# Patient Record
Sex: Female | Born: 1950
Health system: Southern US, Community
[De-identification: ages and names within clinical notes are randomized; demographics above are authoritative.]

## PROBLEM LIST (undated history)

## (undated) DIAGNOSIS — A809 Acute poliomyelitis, unspecified: Secondary | ICD-10-CM

## (undated) DIAGNOSIS — M1990 Unspecified osteoarthritis, unspecified site: Secondary | ICD-10-CM

## (undated) DIAGNOSIS — I10 Essential (primary) hypertension: Secondary | ICD-10-CM

## (undated) DIAGNOSIS — E785 Hyperlipidemia, unspecified: Secondary | ICD-10-CM

## (undated) HISTORY — DX: Acute poliomyelitis, unspecified: A80.9

## (undated) HISTORY — PX: TONSILLECTOMY AND ADENOIDECTOMY: SHX28

## (undated) HISTORY — DX: Hyperlipidemia, unspecified: E78.5

---
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
## 1995-12-06 ENCOUNTER — Encounter (INDEPENDENT_AMBULATORY_CARE_PROVIDER_SITE_OTHER): Payer: Self-pay | Admitting: *Deleted

## 1995-12-06 LAB — CONVERTED CEMR LAB

## 1998-05-16 ENCOUNTER — Encounter: Admission: RE | Admit: 1998-05-16 | Discharge: 1998-05-16 | Payer: Self-pay | Admitting: Family Medicine

## 2001-03-19 ENCOUNTER — Encounter: Admission: RE | Admit: 2001-03-19 | Discharge: 2001-03-19 | Payer: Self-pay | Admitting: Family Medicine

## 2001-04-01 ENCOUNTER — Encounter: Admission: RE | Admit: 2001-04-01 | Discharge: 2001-04-01 | Payer: Self-pay | Admitting: Family Medicine

## 2001-04-20 ENCOUNTER — Encounter: Admission: RE | Admit: 2001-04-20 | Discharge: 2001-04-20 | Payer: Self-pay | Admitting: Family Medicine

## 2003-06-20 ENCOUNTER — Encounter: Admission: RE | Admit: 2003-06-20 | Discharge: 2003-06-20 | Payer: Self-pay | Admitting: Family Medicine

## 2003-06-22 ENCOUNTER — Encounter: Admission: RE | Admit: 2003-06-22 | Discharge: 2003-06-22 | Payer: Self-pay | Admitting: Family Medicine

## 2005-02-19 ENCOUNTER — Ambulatory Visit: Payer: Self-pay | Admitting: Family Medicine

## 2005-03-06 ENCOUNTER — Ambulatory Visit: Payer: Self-pay | Admitting: Family Medicine

## 2006-01-24 ENCOUNTER — Emergency Department (HOSPITAL_COMMUNITY): Admission: EM | Admit: 2006-01-24 | Discharge: 2006-01-24 | Payer: Self-pay | Admitting: Family Medicine

## 2006-12-04 DIAGNOSIS — I10 Essential (primary) hypertension: Secondary | ICD-10-CM | POA: Insufficient documentation

## 2006-12-04 DIAGNOSIS — E669 Obesity, unspecified: Secondary | ICD-10-CM | POA: Insufficient documentation

## 2006-12-04 DIAGNOSIS — G609 Hereditary and idiopathic neuropathy, unspecified: Secondary | ICD-10-CM | POA: Insufficient documentation

## 2006-12-04 DIAGNOSIS — M1990 Unspecified osteoarthritis, unspecified site: Secondary | ICD-10-CM | POA: Insufficient documentation

## 2006-12-05 ENCOUNTER — Encounter (INDEPENDENT_AMBULATORY_CARE_PROVIDER_SITE_OTHER): Payer: Self-pay | Admitting: *Deleted

## 2007-04-29 ENCOUNTER — Ambulatory Visit (HOSPITAL_COMMUNITY): Admission: RE | Admit: 2007-04-29 | Discharge: 2007-04-29 | Payer: Self-pay | Admitting: Family Medicine

## 2007-04-29 ENCOUNTER — Ambulatory Visit: Payer: Self-pay | Admitting: Family Medicine

## 2007-04-29 ENCOUNTER — Encounter (INDEPENDENT_AMBULATORY_CARE_PROVIDER_SITE_OTHER): Payer: Self-pay | Admitting: *Deleted

## 2007-04-30 LAB — CONVERTED CEMR LAB
BUN: 12 mg/dL (ref 6–23)
CO2: 28 meq/L (ref 19–32)
Calcium: 9 mg/dL (ref 8.4–10.5)
Chloride: 103 meq/L (ref 96–112)
Cholesterol: 220 mg/dL — ABNORMAL HIGH (ref 0–200)
Creatinine, Ser: 0.79 mg/dL (ref 0.40–1.20)
Glucose, Bld: 105 mg/dL — ABNORMAL HIGH (ref 70–99)
HDL: 36 mg/dL — ABNORMAL LOW (ref >39)
LDL Cholesterol: 147 mg/dL — ABNORMAL HIGH (ref 0–99)
Potassium: 3.7 meq/L (ref 3.5–5.3)
Sodium: 140 meq/L (ref 135–145)
Total CHOL/HDL Ratio: 6.1
Triglycerides: 183 mg/dL — ABNORMAL HIGH (ref <150)
VLDL: 37 mg/dL (ref 0–40)

## 2007-05-01 ENCOUNTER — Encounter (INDEPENDENT_AMBULATORY_CARE_PROVIDER_SITE_OTHER): Payer: Self-pay | Admitting: *Deleted

## 2007-07-02 ENCOUNTER — Ambulatory Visit: Payer: Self-pay | Admitting: Family Medicine

## 2007-07-02 ENCOUNTER — Encounter (INDEPENDENT_AMBULATORY_CARE_PROVIDER_SITE_OTHER): Payer: Self-pay | Admitting: *Deleted

## 2007-07-03 ENCOUNTER — Encounter (INDEPENDENT_AMBULATORY_CARE_PROVIDER_SITE_OTHER): Payer: Self-pay | Admitting: *Deleted

## 2007-07-03 LAB — CONVERTED CEMR LAB
ALT: 15 units/L (ref 0–35)
AST: 17 units/L (ref 0–37)
Albumin: 4.3 g/dL (ref 3.5–5.2)
Alkaline Phosphatase: 61 units/L (ref 39–117)
BUN: 15 mg/dL (ref 6–23)
CO2: 29 meq/L (ref 19–32)
Calcium: 9.5 mg/dL (ref 8.4–10.5)
Chloride: 101 meq/L (ref 96–112)
Cholesterol: 222 mg/dL — ABNORMAL HIGH (ref 0–200)
Creatinine, Ser: 0.75 mg/dL (ref 0.40–1.20)
Glucose, Bld: 109 mg/dL — ABNORMAL HIGH (ref 70–99)
HDL: 38 mg/dL — ABNORMAL LOW (ref >39)
LDL Cholesterol: 156 mg/dL — ABNORMAL HIGH (ref 0–99)
Potassium: 3.7 meq/L (ref 3.5–5.3)
Sodium: 140 meq/L (ref 135–145)
Total Bilirubin: 0.6 mg/dL (ref 0.3–1.2)
Total CHOL/HDL Ratio: 5.8
Total Protein: 8 g/dL (ref 6.0–8.3)
Triglycerides: 142 mg/dL (ref <150)
VLDL: 28 mg/dL (ref 0–40)

## 2007-07-27 ENCOUNTER — Encounter (INDEPENDENT_AMBULATORY_CARE_PROVIDER_SITE_OTHER): Payer: Self-pay | Admitting: *Deleted

## 2007-07-27 ENCOUNTER — Ambulatory Visit: Payer: Self-pay | Admitting: Family Medicine

## 2007-07-27 DIAGNOSIS — E785 Hyperlipidemia, unspecified: Secondary | ICD-10-CM | POA: Insufficient documentation

## 2007-07-27 LAB — CONVERTED CEMR LAB
BUN: 14 mg/dL (ref 6–23)
CO2: 27 meq/L (ref 19–32)
Calcium: 9.8 mg/dL (ref 8.4–10.5)
Chloride: 103 meq/L (ref 96–112)
Creatinine, Ser: 0.87 mg/dL (ref 0.40–1.20)
Glucose, Bld: 103 mg/dL — ABNORMAL HIGH (ref 70–99)
Potassium: 4 meq/L (ref 3.5–5.3)
Sodium: 140 meq/L (ref 135–145)

## 2007-08-13 ENCOUNTER — Encounter: Admission: RE | Admit: 2007-08-13 | Discharge: 2007-08-13 | Payer: Self-pay | Admitting: Sports Medicine

## 2007-08-24 ENCOUNTER — Encounter (INDEPENDENT_AMBULATORY_CARE_PROVIDER_SITE_OTHER): Payer: Self-pay | Admitting: *Deleted

## 2007-08-24 ENCOUNTER — Ambulatory Visit: Payer: Self-pay | Admitting: Family Medicine

## 2007-08-24 LAB — CONVERTED CEMR LAB: Pap Smear: NORMAL

## 2007-08-31 ENCOUNTER — Encounter (INDEPENDENT_AMBULATORY_CARE_PROVIDER_SITE_OTHER): Payer: Self-pay | Admitting: *Deleted

## 2008-01-01 ENCOUNTER — Ambulatory Visit: Payer: Self-pay | Admitting: Family Medicine

## 2008-01-01 ENCOUNTER — Ambulatory Visit (HOSPITAL_COMMUNITY): Admission: RE | Admit: 2008-01-01 | Discharge: 2008-01-01 | Payer: Self-pay | Admitting: Family Medicine

## 2008-01-01 ENCOUNTER — Observation Stay (HOSPITAL_COMMUNITY): Admission: AD | Admit: 2008-01-01 | Discharge: 2008-01-02 | Payer: Self-pay | Admitting: Family Medicine

## 2008-01-02 ENCOUNTER — Encounter (INDEPENDENT_AMBULATORY_CARE_PROVIDER_SITE_OTHER): Payer: Self-pay | Admitting: *Deleted

## 2008-01-04 ENCOUNTER — Encounter (INDEPENDENT_AMBULATORY_CARE_PROVIDER_SITE_OTHER): Payer: Self-pay | Admitting: *Deleted

## 2008-01-12 ENCOUNTER — Ambulatory Visit (HOSPITAL_COMMUNITY): Admission: RE | Admit: 2008-01-12 | Discharge: 2008-01-12 | Payer: Self-pay | Admitting: Cardiovascular Disease

## 2008-01-19 ENCOUNTER — Encounter (INDEPENDENT_AMBULATORY_CARE_PROVIDER_SITE_OTHER): Payer: Self-pay | Admitting: *Deleted

## 2008-01-20 ENCOUNTER — Encounter (INDEPENDENT_AMBULATORY_CARE_PROVIDER_SITE_OTHER): Payer: Self-pay | Admitting: *Deleted

## 2008-03-03 ENCOUNTER — Encounter: Payer: Self-pay | Admitting: *Deleted

## 2008-06-15 ENCOUNTER — Telehealth: Payer: Self-pay | Admitting: *Deleted

## 2008-06-17 ENCOUNTER — Encounter: Payer: Self-pay | Admitting: Family Medicine

## 2008-06-21 ENCOUNTER — Encounter: Payer: Self-pay | Admitting: Family Medicine

## 2008-07-27 ENCOUNTER — Encounter: Payer: Self-pay | Admitting: Family Medicine

## 2008-08-03 ENCOUNTER — Telehealth: Payer: Self-pay | Admitting: *Deleted

## 2008-08-23 ENCOUNTER — Encounter: Payer: Self-pay | Admitting: Family Medicine

## 2008-08-23 ENCOUNTER — Ambulatory Visit: Payer: Self-pay | Admitting: Family Medicine

## 2008-08-24 LAB — CONVERTED CEMR LAB
BUN: 18 mg/dL (ref 6–23)
CO2: 27 meq/L (ref 19–32)
Calcium: 9.6 mg/dL (ref 8.4–10.5)
Chloride: 103 meq/L (ref 96–112)
Creatinine, Ser: 0.87 mg/dL (ref 0.40–1.20)
Direct LDL: 156 mg/dL — ABNORMAL HIGH
Glucose, Bld: 104 mg/dL — ABNORMAL HIGH (ref 70–99)
Potassium: 3.7 meq/L (ref 3.5–5.3)
Sodium: 140 meq/L (ref 135–145)

## 2009-04-28 ENCOUNTER — Telehealth: Payer: Self-pay | Admitting: *Deleted

## 2009-05-23 ENCOUNTER — Encounter: Payer: Self-pay | Admitting: Family Medicine

## 2009-05-23 ENCOUNTER — Ambulatory Visit: Payer: Self-pay | Admitting: Family Medicine

## 2009-05-23 DIAGNOSIS — M21969 Unspecified acquired deformity of unspecified lower leg: Secondary | ICD-10-CM | POA: Insufficient documentation

## 2009-05-23 HISTORY — DX: Unspecified acquired deformity of unspecified lower leg: M21.969

## 2009-05-24 ENCOUNTER — Encounter: Payer: Self-pay | Admitting: Family Medicine

## 2009-05-24 LAB — CONVERTED CEMR LAB
ALT: 23 units/L (ref 0–35)
AST: 25 units/L (ref 0–37)
Albumin: 4.2 g/dL (ref 3.5–5.2)
Alkaline Phosphatase: 70 units/L (ref 39–117)
BUN: 10 mg/dL (ref 6–23)
CO2: 23 meq/L (ref 19–32)
Calcium: 9.3 mg/dL (ref 8.4–10.5)
Chloride: 108 meq/L (ref 96–112)
Cholesterol: 125 mg/dL (ref 0–200)
Creatinine, Ser: 0.77 mg/dL (ref 0.40–1.20)
Glucose, Bld: 114 mg/dL — ABNORMAL HIGH (ref 70–99)
HCT: 40.3 % (ref 36.0–46.0)
HDL: 39 mg/dL — ABNORMAL LOW (ref >39)
Hemoglobin: 12.6 g/dL (ref 12.0–15.0)
LDL Cholesterol: 65 mg/dL (ref 0–99)
MCHC: 31.3 g/dL (ref 30.0–36.0)
MCV: 96.2 fL (ref 78.0–100.0)
Platelets: 207 10*3/uL (ref 150–400)
Potassium: 3.9 meq/L (ref 3.5–5.3)
RBC: 4.19 M/uL (ref 3.87–5.11)
RDW: 12.5 % (ref 11.5–15.5)
Sodium: 140 meq/L (ref 135–145)
Total Bilirubin: 0.6 mg/dL (ref 0.3–1.2)
Total CHOL/HDL Ratio: 3.2
Total Protein: 7.6 g/dL (ref 6.0–8.3)
Triglycerides: 105 mg/dL (ref <150)
VLDL: 21 mg/dL (ref 0–40)
WBC: 4.9 10*3/uL (ref 4.0–10.5)

## 2009-07-11 ENCOUNTER — Ambulatory Visit: Payer: Self-pay | Admitting: Family Medicine

## 2009-08-16 ENCOUNTER — Telehealth (INDEPENDENT_AMBULATORY_CARE_PROVIDER_SITE_OTHER): Payer: Self-pay | Admitting: Family Medicine

## 2009-12-08 ENCOUNTER — Ambulatory Visit: Payer: Self-pay | Admitting: Family Medicine

## 2009-12-11 ENCOUNTER — Ambulatory Visit: Payer: Self-pay | Admitting: Family Medicine

## 2010-09-05 ENCOUNTER — Encounter: Payer: Self-pay | Admitting: Family Medicine

## 2010-09-05 ENCOUNTER — Ambulatory Visit: Payer: Self-pay | Admitting: Family Medicine

## 2010-09-05 LAB — CONVERTED CEMR LAB
ALT: 18 units/L (ref 0–35)
AST: 19 units/L (ref 0–37)
Albumin: 4.4 g/dL (ref 3.5–5.2)
Alkaline Phosphatase: 60 units/L (ref 39–117)
BUN: 20 mg/dL (ref 6–23)
CO2: 28 meq/L (ref 19–32)
Calcium: 9.7 mg/dL (ref 8.4–10.5)
Chloride: 103 meq/L (ref 96–112)
Creatinine, Ser: 0.88 mg/dL (ref 0.40–1.20)
Direct LDL: 134 mg/dL — ABNORMAL HIGH
Glucose, Bld: 107 mg/dL — ABNORMAL HIGH (ref 70–99)
HCT: 40.6 % (ref 36.0–46.0)
Hemoglobin: 13.1 g/dL (ref 12.0–15.0)
MCHC: 32.3 g/dL (ref 30.0–36.0)
MCV: 94.9 fL (ref 78.0–100.0)
Platelets: 210 10*3/uL (ref 150–400)
Potassium: 4.6 meq/L (ref 3.5–5.3)
RBC: 4.28 M/uL (ref 3.87–5.11)
RDW: 12.4 % (ref 11.5–15.5)
Sodium: 140 meq/L (ref 135–145)
Total Bilirubin: 0.5 mg/dL (ref 0.3–1.2)
Total Protein: 7.7 g/dL (ref 6.0–8.3)
WBC: 4.9 10*3/uL (ref 4.0–10.5)

## 2010-09-06 ENCOUNTER — Encounter: Payer: Self-pay | Admitting: Family Medicine

## 2010-10-02 ENCOUNTER — Encounter: Payer: Self-pay | Admitting: Family Medicine

## 2010-10-02 ENCOUNTER — Ambulatory Visit: Payer: Self-pay

## 2010-11-06 NOTE — Assessment & Plan Note (Signed)
Summary: cpe/tb test,df   Vital Signs:  Patient profile:   60 year old female Height:      63.5 inches Weight:      177 pounds BMI:     30.97 BSA:     1.85 Temp:     98.7 degrees F Pulse rate:   54 / minute BP sitting:   195 / 113  Vitals Entered By: Jone Baseman CMA (December 08, 2009 8:42 AM)  Serial Vital Signs/Assessments:  Comments: 8:47 AM Manual BP: 180/110 By: Jone Baseman CMA   CC: CPE/ TB test Is Patient Diabetic? No Pain Assessment Patient in pain? no        Primary Care Provider:  Ancil Boozer  MD  CC:  CPE/ TB test.  History of Present Illness:  Hypertension follow-up      This is a 60 year old woman who presents for Hypertension follow-up.  The patient denies lightheadedness, headaches, edema, and rash.  The patient denies the following associated symptoms: chest pain, exercise intolerance, dyspnea, and leg edema.  Compliance with medications (by patient report) has been sporadic.  The patient reports that dietary compliance has been poor.  Did not take medications today.  Only has 2 bottles with her including metoprolol and lisinopril/HCTZ.  Does not seem to know she is on spirinolactone.  Ate salty snacks last pm in celebration dinner.  Has 3 new foster kids living at her house.  She needs PE and TB test for foster care.  Habits & Providers  Alcohol-Tobacco-Diet     Tobacco Status: never  Current Medications (verified): 1)  Lisinopril-Hydrochlorothiazide 20-12.5 Mg  Tabs (Lisinopril-Hydrochlorothiazide) .... 2 Pills By Mouth Once Daily 2)  Metoprolol Tartrate 25 Mg Tabs (Metoprolol Tartrate) .Marland Kitchen.. 1 Tab By Mouth Two Times A Day 3)  Aspirin 81 Mg  Tbec (Aspirin) .... One By Mouth Every Day 4)  Caltrate 600+d 600-400 Mg-Unit  Tabs (Calcium Carbonate-Vitamin D) .Marland Kitchen.. 1 By Mouth Bid 5)  Nitroglycerin 0.4 Mg Subl (Nitroglycerin) .Marland Kitchen.. 1 Under Tongue Every 5 Min As Needed For Chest Pain.  If Helps Chest Pain Call Physician Right Away. 6)  Lovastatin  20 Mg Tabs (Lovastatin) .Marland Kitchen.. 1 By Mouth At Bedtime For Cholesterol.  Replaces Your Lipitor. 7)  Spironolactone 25 Mg Tabs (Spironolactone) .Marland Kitchen.. 1 By Mouth Once Daily For Blood Pressure. 8)  Diclofenac Sodium 75 Mg Tbec (Diclofenac Sodium) .Marland Kitchen.. 1 By Mouth Two Times A Day For Pain.  Allergies (verified): 1)  ! Penicillin  Past History:  Past Medical History: Last updated: 01/01/2008   HYPERLIPIDEMIA (ICD-272.4) OSTEOARTHRITIS, MULTI SITES (ICD-715.98) OBESITY, NOS (ICD-278.00) NEUROPATHY, PERIPHERAL (ICD-356.9) HYPERTENSION, BENIGN SYSTEMIC (ICD-401.1) H/o childhood polio infection L foot neuropathy due to polio R knee injection (6/02)  Past Surgical History: Last updated: 12/04/2006 T & A -  Family History: Last updated: 01/01/2008 F--CA (unknown cause), M--htn, DM, died of resp failure in her 14's brother and sister w/ htn no fam hx of colon ca, cad  Social History: Last updated: 12/08/2009 Single, teaches preschool Quit smoking 15 yrs ago no alcohol or other drug use 3 foster kids at home-age 25, 7, 9 (2011)  Risk Factors: Alcohol Use: 0 (07/11/2009)  Risk Factors: Smoking Status: never (12/08/2009)  Social History: Single, teaches preschool Quit smoking 15 yrs ago no alcohol or other drug use 3 foster kids at home-age 25, 7, 9 (2011)  Review of Systems  The patient denies anorexia, weight gain, chest pain, syncope, dyspnea on exertion, peripheral edema, headaches, abdominal  pain, hematochezia, severe indigestion/heartburn, hematuria, muscle weakness, and depression.    Physical Exam  General:  alert, well-developed, and well-nourished.   Head:  normocephalic and atraumatic.   Neck:  supple.   Lungs:  normal respiratory effort.   Heart:  normal rate, regular rhythm, no murmur, no gallop, and no rub.   Abdomen:  soft, non-tender, and normal bowel sounds.     Impression & Recommendations:  Problem # 1:  OSTEOARTHRITIS, MULTI SITES (ICD-715.98)  Her  updated medication list for this problem includes:    Aspirin 81 Mg Tbec (Aspirin) ..... One by mouth every day    Diclofenac Sodium 75 Mg Tbec (Diclofenac sodium) .Marland Kitchen... 1 by mouth two times a day for pain.  Orders: [facility]- Est Level  3 (12878)  Problem # 2:  HYPERTENSION, BENIGN SYSTEMIC (ICD-401.1) Assessment: Deteriorated  Risk of stroke reviewed with pt.  Needs medical and dietary compliance.  Her updated medication list for this problem includes:    Lisinopril-hydrochlorothiazide 20-12.5 Mg Tabs (Lisinopril-hydrochlorothiazide) .Marland Kitchen... 2 pills by mouth once daily    Metoprolol Tartrate 25 Mg Tabs (Metoprolol tartrate) .Marland Kitchen... 1 tab by mouth two times a day    Spironolactone 25 Mg Tabs (Spironolactone) .Marland Kitchen... 1 by mouth once daily for blood pressure.  Orders: [facility]- Est Level  3 (67672)  Problem # 3:  FOOT DEFORMITY, ACQUIRED (ICD-736.70)  Complete Medication List: 1)  Lisinopril-hydrochlorothiazide 20-12.5 Mg Tabs (Lisinopril-hydrochlorothiazide) .... 2 pills by mouth once daily 2)  Metoprolol Tartrate 25 Mg Tabs (Metoprolol tartrate) .Marland Kitchen.. 1 tab by mouth two times a day 3)  Aspirin 81 Mg Tbec (Aspirin) .... One by mouth every day 4)  Caltrate 600+d 600-400 Mg-unit Tabs (Calcium carbonate-vitamin d) .Marland Kitchen.. 1 by mouth bid 5)  Nitroglycerin 0.4 Mg Subl (Nitroglycerin) .Marland Kitchen.. 1 under tongue every 5 min as needed for chest pain.  if helps chest pain call physician right away. 6)  Lovastatin 20 Mg Tabs (Lovastatin) .Marland Kitchen.. 1 by mouth at bedtime for cholesterol.  replaces your lipitor. 7)  Spironolactone 25 Mg Tabs (Spironolactone) .Marland Kitchen.. 1 by mouth once daily for blood pressure. 8)  Diclofenac Sodium 75 Mg Tbec (Diclofenac sodium) .Marland Kitchen.. 1 by mouth two times a day for pain.  Other Orders: TB Skin Test [id number]) Admin 1st Vaccine (96283) Admin 1st Vaccine [facility]) [phone])  Patient Instructions: 1)  Please schedule a follow-up appointment in 1 month to re-check blood pressure.    Prevention & Chronic  Care Immunizations   Influenza vaccine: Fluvax 3+  (07/27/2007)   Influenza vaccine due: 07/26/2008    Tetanus booster: 02/04/1990: Done.   Tetanus booster due: 02/05/2000    Pneumococcal vaccine: Not documented  Colorectal Screening   Hemoccult: negative  (08/24/2007)   Hemoccult due: 08/23/2008    Colonoscopy: Not documented   Colonoscopy due: Not Indicated  Other Screening   Pap smear: normal  (08/24/2007)   Pap smear due: 08/23/2010    Mammogram: Assessment: BIRADS 1.   (08/14/2007)   Mammogram action/deferral: Screening mammogram in 1 year.     (08/14/2007)   Mammogram due: 08/13/2009   Smoking status: never  (12/08/2009)  Lipids   Total Cholesterol: 125  (05/23/2009)   Lipid panel action/deferral: Lipid Panel ordered   LDL: 65  (05/23/2009)   LDL Direct: 156  (08/23/2008)   HDL: 39  (05/23/2009)   Triglycerides: 105  (05/23/2009)   Lipid panel due: 05/23/2010    SGOT (AST): 25  (05/23/2009)   SGPT (ALT): 23  (05/23/2009)   Alkaline  phosphatase: 70  (05/23/2009)   Total bilirubin: 0.6  (05/23/2009)   Liver panel due: 05/23/2010    Lipid flowsheet reviewed?: Yes   Progress toward LDL goal: At goal  Hypertension   Last Blood Pressure: 195 / 113  (12/08/2009)   Serum creatinine: 0.77  (05/23/2009)   Serum potassium 3.9  (05/23/2009)   Basic metabolic panel due: 05/23/2010    Hypertension flowsheet reviewed?: Yes   Progress toward BP goal: Unchanged  Self-Management Support :    Hypertension self-management support: Not documented    Lipid self-management support: Not documented   Prescriptions: NITROGLYCERIN 0.4 MG SUBL (NITROGLYCERIN) 1 under tongue every 5 min as needed for chest pain.  If helps chest pain call physician right away.  #25 x 3   Entered and Authorized by:   Tinnie Gens MD   Signed by:   Tinnie Gens MD on 12/08/2009   Method used:   Electronically to        [facility] [phone]* (retail)       [address]        [place], [place]  [address]       Ph: [phone]       Fax: [phone]   RxID:   [id number] LOVASTATIN 20 MG TABS (LOVASTATIN) 1 by mouth at bedtime for cholesterol.  replaces your lipitor.  #30 x 3   Entered and Authorized by:   Tinnie Gens MD   Signed by:   Tinnie Gens MD on 12/08/2009   Method used:   Electronically to        [facility] [phone]* (retail)       [address]       [place], [place]  [address]       Ph: [phone]       Fax: [phone]   RxID:   [id number] DICLOFENAC SODIUM 75 MG TBEC (DICLOFENAC SODIUM) 1 by mouth two times a day for pain.  #60 x 3   Entered and Authorized by:   Tinnie Gens MD   Signed by:   Tinnie Gens MD on 12/08/2009   Method used:   Electronically to        [facility] [phone]* (retail)       [address]       [place], [place]  [address]       Ph: [phone]       Fax: [phone]   RxID:   [id number] SPIRONOLACTONE 25 MG TABS (SPIRONOLACTONE) 1 by mouth once daily for blood pressure.  #30 x 3   Entered and Authorized by:   Tinnie Gens MD   Signed by:   Tinnie Gens MD on 12/08/2009   Method used:   Electronically to        [facility] [phone]* (retail)       [address]       [place], [place]  [address]       Ph: [phone]       Fax: [phone]   RxID:   [id number] METOPROLOL TARTRATE 25 MG TABS (METOPROLOL TARTRATE) 1 tab by mouth two times a day  #60 x 3   Entered and Authorized by:   Tinnie Gens MD   Signed by:   Tinnie Gens MD on 12/08/2009   Method used:   Electronically to        [facility] [phone]* (retail)       [address]  [place], [place]  [address]       Ph: [phone]       Fax: [phone]   RxID:   [id number] LISINOPRIL-HYDROCHLOROTHIAZIDE 20-12.5 MG  TABS (LISINOPRIL-HYDROCHLOROTHIAZIDE) 2 pills by mouth once daily  #60 x 3   Entered and Authorized by:   Tinnie Gens MD   Signed by:   Tinnie Gens MD on 12/08/2009   Method used:   Electronically to         [facility] [phone]* (retail)       [address]       [place], [place]  [address]       Ph: [phone]       Fax: [phone]   RxID:   [id number]    PPD Application    Vaccine Type: PPD    Site: left forearm    Mfr: Sanofi Pasteur    Dose: 0.1 ml    Route: ID    Given by: Garen Grams LPN    Exp. Date: 03/03/2012    Lot #: [id number]

## 2010-11-06 NOTE — Consult Note (Signed)
Summary: [facility]  [facility]   Imported By: Haydee Salter 01/20/2008 14:04:54  _____________________________________________________________________  External Attachment:    Type:   Image     Comment:   External Document

## 2010-11-06 NOTE — Assessment & Plan Note (Signed)
Summary: read tb/eo   Nurse Visit   Allergies: 1)  ! Penicillin  PPD Results    Date of reading: 12/11/2009    Results: 5 mm    Interpretation: negative  Orders Added: 1)  No Charge Patient Arrived (NCPA0) [NCPA0]

## 2010-11-06 NOTE — Letter (Signed)
Summary: Generic Letter  [facility]  [address]   [place], [place] [address]   Phone: [phone]  Fax: [phone]    09/06/2010  Sarah [name] [address] [place], [place]  [address]  Dear [name],   I write to let you know that I have received the results of your recent lab tests.  The kidney function, liver function and blood count are all normal.  The sugar (glucose) was done after eating, and is normal as well.  I strongly recommend that you continue taking your blood pressure medications and follow up with your primary doctor in our office in the coming month.      Sincerely,   Paula Compton MD  Appended Document: Generic Letter mailed

## 2010-11-06 NOTE — Progress Notes (Signed)
Summary: Rx   Phone Note Call from Patient Call back at Home Phone [phone]   Summary of Call: has scheduled an appt for 11/17 with Dr. Sandi Mealy, would like to discuss refills. Initial call taken by: Haydee Salter,  August 03, 2008 11:18 AM  Follow-up for Phone Call        Pt is out of the following meds.  Advised would send enough to last till appt. Pt agreeable. Follow-up by: Jone Baseman CMA,  August 03, 2008 11:56 AM      Prescriptions: LIPITOR 40 MG TABS (ATORVASTATIN CALCIUM) Take 1 tab by mouth at bedtime  #21 x 0   Entered by:   Jone Baseman CMA   Authorized by:   Ancil Boozer  MD   Signed by:   Jone Baseman CMA on 08/03/2008   Method used:   Electronically to        [facility]* (retail)       [address]       [place], [place]  [address]       Ph: [phone]       Fax: [phone]   RxID:   [id number] NORVASC 10 MG TABS (AMLODIPINE BESYLATE) 1 by mouth once daily  #21 x 0   Entered by:   Jone Baseman CMA   Authorized by:   Ancil Boozer  MD   Signed by:   Jone Baseman CMA on 08/03/2008   Method used:   Electronically to        [facility]* (retail)       [address]       [place], [place]  [address]       Ph: [phone]       Fax: [phone]   RxID:   [id number] METOPROLOL TARTRATE 25 MG TABS (METOPROLOL TARTRATE) 1 tab by mouth two times a day  #42 x 0   Entered by:   Jone Baseman CMA   Authorized by:   Ancil Boozer  MD   Signed by:   Jone Baseman CMA on 08/03/2008   Method used:   Electronically to        [facility]* (retail)       [address]       [place], [place]  [address]       Ph: [phone]       Fax: [phone]   RxID:   [id number] LISINOPRIL-HYDROCHLOROTHIAZIDE 20-12.5 MG  TABS (LISINOPRIL-HYDROCHLOROTHIAZIDE) 2 pills by mouth once daily  #42 x 0   Entered by:   Jone Baseman CMA   Authorized by:   Ancil Boozer  MD   Signed by:   Jone Baseman CMA on 08/03/2008   Method  used:   Electronically to        [facility]* (retail)       [address]       [place], [place]  [address]       Ph: [phone]       Fax: [phone]   RxID:   [id number]

## 2010-11-06 NOTE — Assessment & Plan Note (Signed)
Summary: DNKA               

## 2010-11-06 NOTE — Miscellaneous (Signed)
Summary: DNKA  Clinical Lists Changes 

## 2010-11-06 NOTE — Assessment & Plan Note (Signed)
Summary: f/u meds,df   Vital Signs:  Patient profile:   60 year old female Height:      63.5 inches Weight:      186.3 pounds BMI:     32.60 Temp:     98 degrees F Pulse rate:   87 / minute BP sitting:   196 / 104  (right arm)  Vitals Entered By: Theresia Lo RN (May 23, 2009 8:30 AM) CC: f/u HTN.  having some dizziness and chest tightness Is Patient Diabetic? No Pain Assessment Patient in pain? no        Primary Care Provider:  Ancil Boozer  MD  CC:  f/u HTN.  having some dizziness and chest tightness.  History of Present Illness: HTN: has been out of meds for a week at least.  was taking she thinks just lisinopril-hctz and metoprolol prior to that, not norvasc as prescribed.  has noticed since then that she has occasional "swimmy headedness" that lasts 20-23min and occurs approximately every other day. associated with it is some leg swelling and mild pain.  she thinks when it occurs she has perhaps been overworking.  denies associated weakness.  also at times she notices chest tightness.  usually she is sitting when this occurs and it resolves on its own after 10-15 min.  never has happened with exercise.  doesn't radiate anywhere.  no associated nausea/diaphoresis or SOB.   requesting podiatry referral for left foot deformity.  Habits & Providers  Alcohol-Tobacco-Diet     Tobacco Status: quit  Current Medications (verified): 1)  Lisinopril-Hydrochlorothiazide 20-12.5 Mg  Tabs (Lisinopril-Hydrochlorothiazide) .... 2 Pills By Mouth Once Daily 2)  Metoprolol Tartrate 25 Mg Tabs (Metoprolol Tartrate) .Marland Kitchen.. 1 Tab By Mouth Two Times A Day 3)  Lipitor 40 Mg Tabs (Atorvastatin Calcium) .... Take 1 Tab By Mouth At Bedtime 4)  Aspirin 81 Mg  Tbec (Aspirin) .... One By Mouth Every Day 5)  Caltrate 600+d 600-400 Mg-Unit  Tabs (Calcium Carbonate-Vitamin D) .Marland Kitchen.. 1 By Mouth Bid 6)  Nitroglycerin 0.4 Mg Subl (Nitroglycerin) .Marland Kitchen.. 1 Under Tongue Every 5 Min As Needed For Chest Pain.   If Helps Chest Pain Call Physician Right Away.  Allergies (verified): 1)  ! Penicillin  Past History:  Past medical, surgical, family and social histories (including risk factors) reviewed for relevance to current acute and chronic problems.  Past Medical History: Reviewed history from 01/01/2008 and no changes required.   HYPERLIPIDEMIA (ICD-272.4) OSTEOARTHRITIS, MULTI SITES (ICD-715.98) OBESITY, NOS (ICD-278.00) NEUROPATHY, PERIPHERAL (ICD-356.9) HYPERTENSION, BENIGN SYSTEMIC (ICD-401.1) H/o childhood polio infection L foot neuropathy due to polio R knee injection (6/02)  Past Surgical History: Reviewed history from 12/04/2006 and no changes required. T & A -  Family History: Reviewed history from 01/01/2008 and no changes required. F--CA (unknown cause), M--htn, DM, died of resp failure in her 84's brother and sister w/ htn no fam hx of colon ca, cad  Social History: Reviewed history from 01/01/2008 and no changes required. Single, teaches preschool Quit smoking 15 yrs ago no alcohol or other drug use  Review of Systems       per HPI   Physical Exam  General:  Well-developed,well-nourished,in no acute distress; alert,appropriate and cooperative throughout examination.  obese Head:  Normocephalic and atraumatic without obvious abnormalities. No apparent alopecia or balding. Lungs:  Normal respiratory effort, chest expands symmetrically. Lungs are clear to auscultation, no crackles or wheezes. Heart:  Normal rate and regular rhythm. S1 and S2 normal without gallop, murmur, click, rub  or other extra sounds. Msk:  bony deformity with abnormal gait.  Extremities:  no edema Neurologic:  alert & oriented X3.     Impression & Recommendations:  Problem # 1:  HYPERTENSION, BENIGN SYSTEMIC (ICD-401.1) Assessment Deteriorated  not at goal but not on medications.  restart meds. check basic labs given associated findings.  f/u in 1 month to see if norvasc also needs  restarted.   The following medications were removed from the medication list:    Norvasc 10 Mg Tabs (Amlodipine besylate) .Marland Kitchen... 1 by mouth once daily Her updated medication list for this problem includes:    Lisinopril-hydrochlorothiazide 20-12.5 Mg Tabs (Lisinopril-hydrochlorothiazide) .Marland Kitchen... 2 pills by mouth once daily    Metoprolol Tartrate 25 Mg Tabs (Metoprolol tartrate) .Marland Kitchen... 1 tab by mouth two times a day  Orders: [facility]- Est  Level 4 (33295)  Problem # 2:  DIZZINESS (ICD-780.4) Assessment: New check labs. wonder if this is realted to multiple things - not being on medications, heat, etc. f/u once on medications  Orders: [facility] (18841) [facility]- Est  Level 4 (66063)  Problem # 3:  FOOT DEFORMITY, ACQUIRED (ICD-736.70) Assessment: Deteriorated podiatry referral placed.   Orders: [facility]- Est  Level 4 (01601) Podiatry Referral (Podiatry)  Complete Medication List: 1)  Lisinopril-hydrochlorothiazide 20-12.5 Mg Tabs (Lisinopril-hydrochlorothiazide) .... 2 pills by mouth once daily 2)  Metoprolol Tartrate 25 Mg Tabs (Metoprolol tartrate) .Marland Kitchen.. 1 tab by mouth two times a day 3)  Lipitor 40 Mg Tabs (Atorvastatin calcium) .... Take 1 tab by mouth at bedtime 4)  Aspirin 81 Mg Tbec (Aspirin) .... One by mouth every day 5)  Caltrate 600+d 600-400 Mg-unit Tabs (Calcium carbonate-vitamin d) .Marland Kitchen.. 1 by mouth bid 6)  Nitroglycerin 0.4 Mg Subl (Nitroglycerin) .Marland Kitchen.. 1 under tongue every 5 min as needed for chest pain.  if helps chest pain call physician right away.  Other Orders: T-Lipid Profile [id number]) Comp Met-[facility] [id number])  Patient Instructions: 1)  Please follow up in about 4 weeks so we can see how your blood pressure is doing back on your medications and so we can review your blood work. 2)  Be sure to pick up your medicines at [facility] on [address]. There is a medicine there that if you have chest pain I want you to take.  If it helps your chest pain, call the doctor right away. 3)   It was nice to see you today. Prescriptions: METOPROLOL TARTRATE 25 MG TABS (METOPROLOL TARTRATE) 1 tab by mouth two times a day  #60 x 3   Entered and Authorized by:   Ancil Boozer  MD   Signed by:   Ancil Boozer  MD on 05/23/2009   Method used:   Electronically to        [facility] [phone]* (retail)       [address]       [place], [place]  [address]       Ph: [phone]       Fax: [phone]   RxID:   [id number] LIPITOR 40 MG TABS (ATORVASTATIN CALCIUM) Take 1 tab by mouth at bedtime  #30 x 3   Entered and Authorized by:   Ancil Boozer  MD   Signed by:   Ancil Boozer  MD on 05/23/2009   Method used:   Electronically to        [facility] [phone]* (retail)       [address]       [place], [place]  [address]       Ph: [phone]       Fax: [phone]   RxID:   [id number] LISINOPRIL-HYDROCHLOROTHIAZIDE 20-12.5 MG  TABS (LISINOPRIL-HYDROCHLOROTHIAZIDE) 2 pills by mouth once daily  #60 x 3   Entered and Authorized by:   Ancil Boozer  MD   Signed by:   Ancil Boozer  MD on 05/23/2009   Method used:   Electronically to        [facility] [phone]* (retail)       [address]       [place], [place]  [address]       Ph: [phone]       Fax: [phone]   RxID:   [id number] NITROGLYCERIN 0.4 MG SUBL (NITROGLYCERIN) 1 under tongue every 5 min as needed for chest pain.  If helps chest pain call physician right away.  #25 x 11   Entered and Authorized by:   Ancil Boozer  MD   Signed by:   Ancil Boozer  MD on 05/23/2009   Method used:   Electronically to        [facility] [phone]* (retail)       [address]       [place], [place]  [address]       Ph: [phone]       Fax: [phone]   RxID:   [id number]   Prevention & Chronic Care Immunizations   Influenza vaccine: Fluvax 3+  (07/27/2007)   Influenza vaccine due: 07/26/2008    Tetanus booster: 02/04/1990: Done.   Tetanus booster due: 02/05/2000     Pneumococcal vaccine: Not documented  Colorectal Screening   Hemoccult: negative  (08/24/2007)   Hemoccult due: 08/23/2008    Colonoscopy: Not documented   Colonoscopy due: Not Indicated  Other Screening   Pap smear: normal  (08/24/2007)   Pap smear due: 08/23/2010    Mammogram: Assessment: BIRADS 1.   (08/14/2007)   Mammogram action/deferral: Screening mammogram in 1 year.     (08/14/2007)   Mammogram due: 08/13/2009   Smoking status: quit  (05/23/2009)  Lipids   Total Cholesterol: 222  (07/02/2007)   Lipid panel action/deferral: Lipid Panel ordered   LDL: 156  (07/02/2007)   LDL Direct: 156  (08/23/2008)   HDL: 38  (07/02/2007)   Triglycerides: 142  (07/02/2007)    SGOT (AST): 17  (07/02/2007)   SGPT (ALT): 15  (07/02/2007) CMP ordered    Alkaline phosphatase: 61  (07/02/2007)   Total bilirubin: 0.6  (07/02/2007)  Hypertension   Last Blood Pressure: 196 / 104  (05/23/2009)   Serum creatinine: 0.87  (08/23/2008)   Serum potassium 3.7  (08/23/2008) CMP ordered   Self-Management Support :    Hypertension self-management support: Not documented    Lipid self-management support: Not documented

## 2010-11-06 NOTE — Assessment & Plan Note (Signed)
Summary: 1 MO F/U/DLH  Medications Added NORVASC 10 MG TABS (AMLODIPINE BESYLATE) 1 by mouth once daily      Allergies Added: NKDA  Vital Signs:  Patient Profile:   60 Years Old Female Height:     63.5 inches Weight:      185.0 pounds Temp:     98.7 degrees F Pulse rate:   63 / minute BP sitting:   152 / 86  Pt. in pain?   yes    Location:   abdomen    Intensity:   8  Vitals Entered By: Altamese Dilling CMA, (August 24, 2007 10:47 AM)                  Chief Complaint:  1 MO F/U.  History of Present Illness: Hypertension      Using medication:  _x_ without problems  Lightheadedness:  _x_ none Chest pain with exertion: _x_ none  Headaches:  ___x none Vision changes:  __x_ none  Elevated Cholesterol :  Continues with diet and exercise. Increasing amount of fruits and vegetables. Exercising w/ walking and situps 2-3 x per week.  abdominal pain- 1 day history of upper abdominal pain. Is "burning" pain.  Was present yesterday, then went away overnight, and is back again this morning. NO relation to food. Eating normally.  No nausea, vomiting, diarrhea, melena, hematochezia.   Health maintanence- due for pap today. she is divorced and not currently sexually active. She is s/p tubal ligation. She has not had a period in 2 years. no vaginal bleeding or spotting. Occasional hot flash but these are not symptomatic.  She does regular self breast exams and has not noted any masses, bleeding, or discharge. Mammogram normal last month.  No vaginal discharge.    Current Allergies: No known allergies    Social History:    Reviewed history from 07/02/2007 and no changes required:       Single, teaches preschool       Quit smoking 15 yrs ago       no alchohol or other drug use    Review of Systems  GI      Denies change in bowel habits, constipation, and yellowish skin color.  GU      Denies dysuria and genital sores.   Physical Exam  General:  Well-developed,well-nourished,in no acute distress; alert,appropriate and cooperative throughout examination Mouth:     pharynx pink and moist.   Neck:     no carotid bruits. No thyromegaly or thyroid nodules Lungs:     Normal respiratory effort, chest expands symmetrically. Lungs are clear to auscultation, no crackles or wheezes. Heart:     Normal rate and regular rhythm. S1 and S2 normal without gallop, murmur, click, rub or other extra sounds. Abdomen:     Bowel sounds positive,abdomen soft and non-tender without masses, organomegaly or hernias noted. Rectal:     No external abnormalities noted. Normal sphincter tone. No rectal masses or tenderness. stool heme negative. Genitalia:     Pelvic Exam:        External: normal female genitalia without lesions or masses        Vagina: normal without lesions or masses        Cervix: normal without lesions or masses        Adnexa: normal bimanual exam without masses or fullness        Uterus: normal by palpation        Pap smear: performed    Impression &  Recommendations:  Problem # 1:  HYPERTENSION, BENIGN SYSTEMIC (ICD-401.1) Assessment: Unchanged Blood pressure improved, but still above goal. Will increase norvasc to 10 mg daily. Con't diet, exercise. She is avoiding salty foods. Her updated medication list for this problem includes:    Lisinopril-hydrochlorothiazide 20-12.5 Mg Tabs (Lisinopril-hydrochlorothiazide) .Marland Kitchen... 2 pills by mouth once daily    Metoprolol Tartrate 25 Mg Tabs (Metoprolol tartrate) .Marland Kitchen... 1 tab by mouth two times a day    Norvasc 10 Mg Tabs (Amlodipine besylate) .Marland Kitchen... 1 by mouth once daily  Orders: [facility]- Est  Level 4 (87564)   Problem # 2:  HYPERLIPIDEMIA (ICD-272.4) Assessment: Unchanged Continue to work on diet and exercise. Recheck LDL in 1 month. Orders: [facility]- Est  Level 4 (33295)   Problem # 3:  Preventive Health Care (ICD-V70.0) Assessment: Unchanged Mammogram normal. Stool  heme negative. Working  on arranging colonoscopy for screening. She is planning to meet with debra hill to get project acess.  Will notify her of pap smear results. She has had flu shot.  Consider tetanus vaccine at next visit.  Problem # 4:  ABDOMINAL PAIN (ICD-789.00) Assessment: New No evidence of acute abdomen. Normal physical exam.  Will continute to follow. She will notify of any worsening or persistant symptoms. Orders: [facility]- Est  Level 4 (18841)   Complete Medication List: 1)  Lisinopril-hydrochlorothiazide 20-12.5 Mg Tabs (Lisinopril-hydrochlorothiazide) .... 2 pills by mouth once daily 2)  Metoprolol Tartrate 25 Mg Tabs (Metoprolol tartrate) .Marland Kitchen.. 1 tab by mouth two times a day 3)  Norvasc 10 Mg Tabs (Amlodipine besylate) .Marland Kitchen.. 1 by mouth once daily  Other Orders: Pap Smear-[facility] (66063-01601)   Patient Instructions: 1)  Please schedule a follow-up appointment in 1 month. 2)  Increase norvasc to 10 mg daily 3)  continue diet and exercise to work on high cholesterol    Prescriptions: NORVASC 10 MG TABS (AMLODIPINE BESYLATE) 1 by mouth once daily  #30 x 5   Entered and Authorized by:   Benn Moulder MD   Signed by:   Benn Moulder MD on 08/24/2007   Method used:   Electronically sent to ...       [address]       [address]       [place], [place]  [address]       Ph: [phone]       Fax: [phone]   RxID:   [id number]  ]  Preventive Care Screening  Breast Self Exam Ed:    Date:  08/24/2007    Results:  yes  Hemoccult:    Date:  08/24/2007    Next Due:  09/2008    Results:  negative

## 2010-11-06 NOTE — Letter (Signed)
Summary: Lipid Letter  [facility]  [address]   [place], [place] [address]   Phone: [phone]  Fax: [phone]    05/24/2009  Sarah [name] [address] [place], [place]  [address]  Dear Lucendia Herrlich:  We have carefully reviewed your last lipid profile from 05/23/2009 and the results are noted below with a summary of recommendations for lipid management.    Cholesterol:       125     Goal: < 200   HDL "good" Cholesterol:   39     Goal: > 40   LDL "bad" Cholesterol:   65     Goal: < 130   Triglycerides:       105     Goal: < 150    Overall these numbers are excellent.  Below are some ways to help keep them that way.  We also checked your kidney and liver function as well as your blood counts.  All of this was normal as well.  If you have questions please let us know at the family practice center.    TLC Diet (Therapeutic Lifestyle Change): Saturated Fats & Transfatty acids should be kept < 7% of total calories ***Reduce Saturated Fats Polyunstaurated Fat can be up to 10% of total calories Monounsaturated Fat Fat can be up to 20% of total calories Total Fat should be no greater than 25-35% of total calories Carbohydrates should be 50-60% of total calories Protein should be approximately 15% of total calories Fiber should be at least 20-30 grams a day ***Increased fiber may help lower LDL Total Cholesterol should be < 200mg /day Consider adding plant stanol/sterols to diet (example: Benacol spread) ***A higher intake of unsaturated fat may reduce Triglycerides and Increase HDL    Adjunctive Measures (may lower LIPIDS and reduce risk of Heart Attack) include: Aerobic Exercise (20-30 minutes 3-4 times a week) Limit Alcohol Consumption Weight Reduction Aspirin 75-81 mg a day by mouth (if not allergic or contraindicated) Dietary Fiber 20-30 grams a day by mouth     Current Medications: 1)    Lisinopril-hydrochlorothiazide 20-12.5 Mg  Tabs  (Lisinopril-hydrochlorothiazide) .... 2 pills by mouth once daily 2)    Metoprolol Tartrate 25 Mg Tabs (Metoprolol tartrate) .Marland Kitchen.. 1 tab by mouth two times a day 3)    Lipitor 40 Mg Tabs (Atorvastatin calcium) .... Take 1 tab by mouth at bedtime 4)    Aspirin 81 Mg  Tbec (Aspirin) .... One by mouth every day 5)    Caltrate 600+d 600-400 Mg-unit  Tabs (Calcium carbonate-vitamin d) .Marland Kitchen.. 1 by mouth bid 6)    Nitroglycerin 0.4 Mg Subl (Nitroglycerin) .Marland Kitchen.. 1 under tongue every 5 min as needed for chest pain.  if helps chest pain call physician right away.  If you have any questions, please call. We appreciate being able to work with you.   Sincerely,    [facility] Ancil Boozer  MD  Appended Document: Lipid Letter Mailed.

## 2010-11-06 NOTE — Assessment & Plan Note (Signed)
Summary: FU/KH  Medications Added LISINOPRIL-HYDROCHLOROTHIAZIDE 20-12.5 MG  TABS (LISINOPRIL-HYDROCHLOROTHIAZIDE) 2 pills by mouth once daily NORVASC 5 MG TABS (AMLODIPINE BESYLATE) 1 by mouth once daily      Allergies Added: NKDA  Vital Signs:  Patient Profile:   60 Years Old Female Height:     63.5 inches Weight:      190.3 pounds BMI:     33.30 Temp:     98.1 degrees F Pulse rate:   53 / minute BP sitting:   189 / 96  Pt. in pain?   yes    Location:   l breast    Intensity:   4  Vitals Entered By: Golden Circle RN (July 27, 2007 9:16 AM)                  Chief Complaint:  f/u meds.  History of Present Illness: Hypertension      Using medication:  _x_ without problems  Lightheadedness:  _x_ none Chest pain with exertion: _x_ none  Headaches:  __x_ none Vision changes:  __x_ none Average home BPs: has not checked. She has been watching her sodium intake.  She does not eat many vegetables.  hyperlipidemia- cholesterol panel drawn and results reviewed.  LDL of 156 above goal. HDL low at 38 as well.  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  She admits she does have room for improvement in her diet.   Colon CA screening- she has thought about options and would like colonoscopy. No melena, hematochezia, or fam hx of colon cancer  Mammogram- she has not scheduled yet.  Current Allergies: No known allergies    Family History:    Reviewed history from 07/02/2007 and no changes required:       F--CA (unknown cause), M--htn, DM, died of resp failure       brother and sister w/ htn       no fam hx of colon ca, cad  Social History:    Reviewed history from 07/02/2007 and no changes required:       Single, teaches preschool       Quit smoking 15 yrs ago       no alchohol or other drug use     Physical Exam  General:     Well-developed,well-nourished,in no acute distress; alert,appropriate and cooperative  throughout examination Mouth:     pharynx pink and moist.   Neck:     no carotid bruits. No thyromegaly or thyroid nodules Lungs:     Normal respiratory effort, chest expands symmetrically. Lungs are clear to auscultation, no crackles or wheezes. Heart:     Normal rate and regular rhythm. S1 and S2 normal without gallop, murmur, click, rub or other extra sounds.    Impression & Recommendations:  Problem # 1:  HYPERTENSION, BENIGN SYSTEMIC (ICD-401.1) Assessment: Unchanged Blood pressure remains well above goal. Will increase lisinopril-hctz dose. Unable to increase metoprolol due to heart rate.  Will add norvasc.  F/u in 2-4 weeks. If still elevated will need to increase norvasc dose.  Discussed dietary changes and low sodium diet.  Check renal function today. Her updated medication list for this problem includes:      Lisinopril-hydrochlorothiazide 20-12.5 Mg Tabs (Lisinopril-hydrochlorothiazide) .Marland Kitchen... 2 pills by mouth once daily    Metoprolol Tartrate 25 Mg Tabs (Metoprolol tartrate) .Marland Kitchen... 1 tab by mouth two times a day    Norvasc 5 Mg Tabs (Amlodipine besylate) .Marland KitchenMarland KitchenMarland KitchenMarland Kitchen  1 by mouth once daily  Orders: [facility]- Est  Level 4 (67893) Basic Met-[facility] (81017-51025) Gastroenterology Referral (GI)  Her updated medication list for this problem includes:    Lisinopril-hydrochlorothiazide 20-12.5 Mg Tabs (Lisinopril-hydrochlorothiazide) .Marland Kitchen... 2 pills by mouth once daily    Metoprolol Tartrate 25 Mg Tabs (Metoprolol tartrate) .Marland Kitchen... 1 tab by mouth two times a day    Norvasc 5 Mg Tabs (Amlodipine besylate) .Marland Kitchen... 1 by mouth once daily   Problem # 2:  HYPERLIPIDEMIA (ICD-272.4) Assessment: New LDL of 156 above goal of 130.  Will try a 3 month trial of diet and exercise and then recheck LDL. Reviewed dietary guidance and handout given. She declines nutritionist appointment at this time. Orders: [facility]- Est  Level 4 (85277)   Problem # 3:  Preventive Health Care (ICD-V70.0) Assessment: Comment  Only Flu shot given today. She would like colonscopy for colon ca screening.  Will f/u for pap at next appt.  Was given handout to schedule mammogram.  Complete Medication List: 1)  Lisinopril-hydrochlorothiazide 20-12.5 Mg Tabs (Lisinopril-hydrochlorothiazide) .... 2 pills by mouth once daily 2)  Metoprolol Tartrate 25 Mg Tabs (Metoprolol tartrate) .Marland Kitchen.. 1 tab by mouth two times a day 3)  Norvasc 5 Mg Tabs (Amlodipine besylate) .Marland Kitchen.. 1 by mouth once daily  Other Orders: Flu Vaccine 54yrs + (82423) Admin 1st Vaccine (53614)   Patient Instructions: 1)  for high blood pressure- Increase dose of lisinopril-hctz (you can take two pills at once, and then get new prescription).  continue metoprolol at same dose.  New medicine is norvasc (amlodipine) 1 pill daily. 2)  for your high cholesterol, work on diet exercise. 3)  It is important that you exercise regularly at least 20 minutes 5 times a week. 4)  Schedule your mammogram!!!. 5)  We will contact you with colonoscopy appt. 6)  F/u in 2-4 weeks for recheck of blood pressure and pap smear.    Prescriptions: NORVASC 5 MG TABS (AMLODIPINE BESYLATE) 1 by mouth once daily  #30 x 3   Entered and Authorized by:   Benn Moulder MD   Signed by:   Benn Moulder MD on 07/27/2007   Method used:   Electronically sent to ...       [facility] [address]*       [address]       [place], [place]  [address]       Ph: [phone]       Fax: [phone]   RxID:   [id number] LISINOPRIL-HYDROCHLOROTHIAZIDE 20-12.5 MG  TABS (LISINOPRIL-HYDROCHLOROTHIAZIDE) 2 pills by mouth once daily  #90 x 2   Entered and Authorized by:   Benn Moulder MD   Signed by:   Benn Moulder MD on 07/27/2007   Method used:   Electronically sent to ...       [facility] [address]       [address]       [place], [place]  [address]       Ph: [phone]       Fax: [phone]   RxID:   [id number]  ]  Vital Signs:  Patient Profile:   60 Years Old  Female Height:     63.5 inches Weight:      190.3 pounds BMI:     33.30 Temp:     98.1 degrees F Pulse rate:   53 / minute BP sitting:   189 / 96    Location:   l breast    Intensity:  4                Influenza Vaccine    Vaccine Type: Fluvax 3+    Site: left deltoid    Mfr: Sanofi Pasteur    Dose: 0.5 ml    Route: IM    Given by: Golden Circle RN    Exp. Date: 04/05/2008    Lot #: [id number]    VIS given: 04/05/05 version given July 27, 2007.  Flu Vaccine Consent Questions    Do you have a history of severe allergic reactions to this vaccine? no    Any prior history of allergic reactions to egg and/or gelatin? no    Do you have a sensitivity to the preservative Thimersol? no    Do you have a past history of Guillan-Barre Syndrome? no    Do you currently have an acute febrile illness? no    Have you ever had a severe reaction to latex? no    Vaccine information given and explained to patient? yes    Are you currently pregnant? no   Appended Document: FU/KH Blood pressure was 172/87 on manual recheck. No signs/sx of htn emergency.  Normal fundoscopic exam.

## 2010-11-06 NOTE — Miscellaneous (Signed)
Summary: d/c meds  Medications Added LIPITOR 40 MG TABS (ATORVASTATIN CALCIUM) Take 1 tab by mouth at bedtime OMEPRAZOLE 20 MG CPDR (OMEPRAZOLE) one by mouth daily ASPIRIN 81 MG  TBEC (ASPIRIN) one by mouth every day CALTRATE 600+D 600-400 MG-UNIT  TABS (CALCIUM CARBONATE-VITAMIN D) 1 by mouth bid       Clinical Lists Changes  Medications: Added new medication of LIPITOR 40 MG TABS (ATORVASTATIN CALCIUM) Take 1 tab by mouth at bedtime - Signed Added new medication of OMEPRAZOLE 20 MG CPDR (OMEPRAZOLE) one by mouth daily - Signed Added new medication of ASPIRIN 81 MG  TBEC (ASPIRIN) one by mouth every day Added new medication of CALTRATE 600+D 600-400 MG-UNIT  TABS (CALCIUM CARBONATE-VITAMIN D) 1 by mouth bid Rx of LISINOPRIL-HYDROCHLOROTHIAZIDE 20-12.5 MG  TABS (LISINOPRIL-HYDROCHLOROTHIAZIDE) 2 pills by mouth once daily;  #60 x 3;  Signed;  Entered by: Benn Moulder MD;  Authorized by: Benn Moulder MD;  Method used: Handwritten Rx of LIPITOR 40 MG TABS (ATORVASTATIN CALCIUM) Take 1 tab by mouth at bedtime;  #30 x 3;  Signed;  Entered by: Benn Moulder MD;  Authorized by: Benn Moulder MD;  Method used: Handwritten Rx of OMEPRAZOLE 20 MG CPDR (OMEPRAZOLE) one by mouth daily;  #30 x 3;  Signed;  Entered by: Benn Moulder MD;  Authorized by: Benn Moulder MD;  Method used: Handwritten Rx of NORVASC 10 MG TABS (AMLODIPINE BESYLATE) 1 by mouth once daily;  #30 x 3;  Signed;  Entered by: Benn Moulder MD;  Authorized by: Benn Moulder MD;  Method used: Handwritten Rx of METOPROLOL TARTRATE 25 MG TABS (METOPROLOL TARTRATE) 1 tab by mouth two times a day;  #60 x 3;  Signed;  Entered by: Benn Moulder MD;  Authorized by: Benn Moulder MD;  Method used: Handwritten    Prescriptions: METOPROLOL TARTRATE 25 MG TABS (METOPROLOL TARTRATE) 1 tab by mouth two times a day  #60 x 3   Entered and Authorized by:   Benn Moulder MD   Signed by:   Benn Moulder MD on 01/02/2008   Method used:   Handwritten   RxID:    [id number] NORVASC 10 MG TABS (AMLODIPINE BESYLATE) 1 by mouth once daily  #30 x 3   Entered and Authorized by:   Benn Moulder MD   Signed by:   Benn Moulder MD on 01/02/2008   Method used:   Handwritten   RxID:   [id number] OMEPRAZOLE 20 MG CPDR (OMEPRAZOLE) one by mouth daily  #30 x 3   Entered and Authorized by:   Benn Moulder MD   Signed by:   Benn Moulder MD on 01/02/2008   Method used:   Handwritten   RxID:   [id number] LIPITOR 40 MG TABS (ATORVASTATIN CALCIUM) Take 1 tab by mouth at bedtime  #30 x 3   Entered and Authorized by:   Benn Moulder MD   Signed by:   Benn Moulder MD on 01/02/2008   Method used:   Handwritten   RxID:   [id number] LISINOPRIL-HYDROCHLOROTHIAZIDE 20-12.5 MG  TABS (LISINOPRIL-HYDROCHLOROTHIAZIDE) 2 pills by mouth once daily  #60 x 3   Entered and Authorized by:   Benn Moulder MD   Signed by:   Benn Moulder MD on 01/02/2008   Method used:   Handwritten   RxID:   [id number]

## 2010-11-06 NOTE — Assessment & Plan Note (Signed)
Summary: BP CHECK/[facility]   Impression & Recommendations:  Problem # 1:  HYPERTENSION, BENIGN SYSTEMIC (ICD-401.1) Assessment: Deteriorated Hypertensive Urgency:  EKG shows NSR w/o any ST changes.  No old ones for comparison.  Precepted pt w/ Attending, dr Leveda Anna, and we felt pt could be managed with OP BP medication w/ close follow-up.   Pt will likely need a ETT or cardiolyte stress test and/or ECHO at some point.  Today I restarted HCTZ and started Metoprolol low dose.  Likely will need to titrate up BB and add on 3rd medication.  Drew routine HTN labs as well as FLP to risk stratify.  RTC in 1-2 weeks w/ PCP Dr Seleta Rhymes.  Discussed importance of low salt diet and gave examples.  Did not have time to discuss social history...will need to address  Her updated medication list for this problem includes:    Hydrochlorothiazide 25 Mg Tabs (Hydrochlorothiazide) .Marland Kitchen... Take 1 tablet by mouth once a day    Metoprolol Tartrate 25 Mg Tabs (Metoprolol tartrate) .Marland Kitchen... 1 tab by mouth two times a day  Orders: Basic Met-[facility] (20254-27062) Lipid-[facility] (37628-31517) [facility]- Est Level  3 (99213)  BP today: 193/108   Medications Added to Medication List This Visit: 1)  Hydrochlorothiazide 25 Mg Tabs (Hydrochlorothiazide) .... Take 1 tablet by mouth once a day 2)  Metoprolol Tartrate 25 Mg Tabs (Metoprolol tartrate) .Marland Kitchen.. 1 tab by mouth two times a day  Other Orders: EKG- [facility] (EKG)   Physical Exam  General:     Well-developed,well-nourished,in no acute distress; alert,appropriate and cooperative throughout examination Eyes:     PERRLA, EOM intact.  Fundoscopic exam:  R optic disk visualized.  No nicking visible.  There is a small, dark colored circular lesion visible outside optic disk at about 11 oclock.  L: unable to clearly visiualize cup or vessels.   Neck:     no JVD Lungs:     Normal respiratory effort, chest expands symmetrically. Lungs are clear to auscultation, no crackles or  wheezes. Heart:     Normal rate and regular rhythm. S1 and S2 normal without gallop, murmur, click, rub or other extra sounds.   Past Medical History:    Reviewed history from 12/04/2006 and no changes required:       H/o childhood polio infection       L foot neuropathy due to polio       R knee injection (6/02)  Past Surgical History:    Reviewed history from 12/04/2006 and no changes required:       T & A -   Patient Instructions: 1)  Please schedule a follow-up appointment in about 1week. 2)  HCTZ(hydrochlorothiazide)25mg .  Take on pill a day 3)  Metoprolol 25mg .  One pill twice a day 4)  One baby aspirin (81 mg) daily 5)  Go to ER if chest pain worsens or you sweating, vomiting, or shortness of breath   Family History:    Reviewed history from 12/04/2006 and no changes required:       F--CA, M--htn  Medications Added HYDROCHLOROTHIAZIDE 25 MG TABS (HYDROCHLOROTHIAZIDE) Take 1 tablet by mouth once a day METOPROLOL TARTRATE 25 MG TABS (METOPROLOL TARTRATE) 1 tab by mouth two times a day       Nurse Visit   Vital Signs:  Patient Profile:   60 Years Old Female Pulse rate:   73 / minute BP supine:   199 / 114 BP sitting:   193 / 108  Prior Medications:     Orders Added: 1)  EKG- [facility] [EKG] 2)  Basic Met-[facility] [76283-15176] 3)  Lipid-[facility] [80061-22930] 4)  [facility]- Est Level  3 [16073]    Prescriptions: METOPROLOL TARTRATE 25 MG TABS (METOPROLOL TARTRATE) 1 tab by mouth two times a day  #62 x 0   Entered by:   Johney [place] MD   Authorized by:   Default Provider   Signed by:   Johney [place] MD on 04/29/2007   Method used:   Print then Give to Patient   RxID:   [id number] HYDROCHLOROTHIAZIDE 25 MG TABS (HYDROCHLOROTHIAZIDE) Take 1 tablet by mouth once a day  #31 x 3   Entered by:   Johney [place] MD   Authorized by:   Default Provider   Signed by:   Johney [place] MD on 04/29/2007   Method used:   Print then Give to Patient   RxID:    [id number]   History of Present Illness Reason for visit: high bp Chief Complaint: high bp History of Present Illness: 60 yo female who has not been seen at [facility] came for elevated BP that was noticed in ED when she brought her grandaughter.  At that time it was 190/112 and she had blurry vision.  Over past month she has noticed occasional chest tightness off and on.  Occurs while sitting and lasts a few seconds.  Does not radiate to arm or face.  Does not have diaphoresis with this.  Does not occur w/ exertion. Does have left arm pain and numbness but this occurs at separate times, also at rest.  Occasionally has blurry vision.  Occasional HA and dizziness.  Today she  has had a "little" chest tightness.

## 2010-11-06 NOTE — Assessment & Plan Note (Signed)
Summary: fu wp      Allergies Added: NKDA  Vital Signs:  Patient Profile:   60 Years Old Female Height:     63.5 inches Weight:      184.5 pounds BMI:     32.29 Temp:     98.1 degrees F oral Pulse rate:   57 / minute BP sitting:   189 / 87  (left arm)  Pt. in pain?   no  Vitals Entered By: Garen Grams LPN (January 01, 2008 9:16 AM)                  Chief Complaint:  f/u visit.  History of Present Illness: 60 yo female w/ hx of HTN, hyperlipidemia, and 20 pack year smoking hx (quit 15 yrs ago) presented to [facility] today for f/u. She notes that over the past 10 days she has been having new  left sided chest pain.  Pain is described as "tightness", is non-radiating. It lasts for 5-10 minutes and has been occurring every other day or so.  CP comes on at rest and is non-exertional, but relieved with rest and aspirin.  She has had SOB and diaphoresis with chest pain.  No nausea/vomiting.   Denies recent travel, immobilization, calf pain/swelling No cough, recent illnesses, fevers Pain is unrelated to food. No hx of GERD.  She has not been taking norvasc for HTN (didn't know she was supposed to be on it)    Current Allergies: No known allergies   Past Medical History:    Reviewed history from 04/29/2007 and no changes required:                     HYPERLIPIDEMIA (ICD-272.4)       OSTEOARTHRITIS, MULTI SITES (ICD-715.98)       OBESITY, NOS (ICD-278.00)       NEUROPATHY, PERIPHERAL (ICD-356.9)       HYPERTENSION, BENIGN SYSTEMIC (ICD-401.1)       H/o childhood polio infection       L foot neuropathy due to polio       R knee injection (6/02)         Past Surgical History:    Reviewed history from 12/04/2006 and no changes required:       T & A -   Family History:    Reviewed history from 07/02/2007 and no changes required:       F--CA (unknown cause), M--htn, DM, died of resp failure in her 30's       brother and sister w/ htn       no fam hx of colon ca,  cad  Social History:    Single, teaches preschool    Quit smoking 15 yrs ago    no alcohol or other drug use    Review of Systems  General      Denies fatigue and fever.  CV      Denies difficulty breathing while lying down, near fainting, and palpitations.  Resp      Denies cough, pleuritic, and wheezing.  GI      Denies abdominal pain, bloody stools, nausea, and vomiting.  GU      Denies hematuria.  Neuro      occasional brief headache across forehead, no nausea, vomiting, phonophobia.    Psych      Denies anxiety.   Physical Exam  General:     Well-developed,well-nourished,in no acute distress; alert,appropriate and cooperative throughout examination Eyes:  No corneal or conjunctival inflammation noted. EOMI. Perrla. Funduscopic exam benign, without hemorrhages, exudates or papilledema. Vision grossly normal. Mouth:     pharynx pink and moist.   Neck:     no carotid bruits Chest Wall:     CP not reproducible Lungs:     Normal respiratory effort, chest expands symmetrically. Lungs are clear to auscultation, no crackles or wheezes. Heart:     Normal rate and regular rhythm. S1 and S2 normal without gallop, murmur, click, rub or other extra sounds. Abdomen:     Bowel sounds positive,abdomen soft and non-tender without masses, organomegaly or hernias noted. Pulses:     2+ dp pulses Extremities:     no edema Neurologic:     cranial nerves II-XII intact, strength normal in all extremities, and gait normal.   Psych:     not anxious appearing and not depressed appearing.      Impression & Recommendations:  Problem # 1:  CHEST PAIN (ICD-786.50) Assessment: New New onset left sided chest pain at rest associated w/ sob, diphoresis.  Non-exertional. Improves with rest/asa.  Concern for possible unstable angina given risk factors of poorly controlled HTN, smoking hx, hyperlipidemia.  No risk factors for PE.  Will admit to telemtry, cycle cardiac enzymes,  risk stratify. She is on betablocker.  WIll give asa 325 mg, start heparin drip.  NTG for pain.  Will start protonix to cover possible GI etiology.  will discuss with cardiology possible cardiac work up if rule out for MI. Orders: EKG- [facility] (EKG) [facility]- Est Level  5 (52841)   Problem # 2:  HYPERTENSION, BENIGN SYSTEMIC (ICD-401.1) Assessment: Deteriorated Blood pressure well above goal. She had not been taking norvasc.   Will add this. Unable to push beta-blocker further due to HR. Will follow BP's closely while inpatient and adjust medicines as needed.  May need to add spironolactone if not improving.  Doubt cp is due to HTN emergency.   Her updated medication list for this problem includes:    Lisinopril-hydrochlorothiazide 20-12.5 Mg Tabs (Lisinopril-hydrochlorothiazide) .Marland Kitchen... 2 pills by mouth once daily    Metoprolol Tartrate 25 Mg Tabs (Metoprolol tartrate) .Marland Kitchen... 1 tab by mouth two times a day    Norvasc 10 Mg Tabs (Amlodipine besylate) .Marland Kitchen... 1 by mouth once daily  Orders: Comp Met-[facility] ([id number]) [facility]- Est Level  5 (53664)   Problem # 3:  HYPERLIPIDEMIA (ICD-272.4) Assessment: Unchanged LDL 156 when last checked.  She was working on diet and exercise and due for recheck of lipids today. Will check fasting lipid panel, start statin. Orders: Comp Met-[facility] [id number]) Direct LDL-[facility] [phone]) [facility]- Est Level  5 ([id number])   Complete Medication List: 1)  Lisinopril-hydrochlorothiazide 20-12.5 Mg Tabs (Lisinopril-hydrochlorothiazide) .... 2 pills by mouth once daily 2)  Metoprolol Tartrate 25 Mg Tabs (Metoprolol tartrate) .Marland Kitchen.. 1 tab by mouth two times a day 3)  Norvasc 10 Mg Tabs (Amlodipine besylate) .Marland Kitchen.. 1 by mouth once daily     ]  Vital Signs:  Patient Profile:   60 Years Old Female Height:     63.5 inches Weight:      184.5 pounds BMI:     32.29 Temp:     98.1 degrees F oral Pulse rate:   57 / minute BP sitting:   189 / 87                 Appended Document:  fu wp EKG nsr at 75 bpm.  No ST elevation in contiguous leads.

## 2010-11-06 NOTE — Assessment & Plan Note (Signed)
Summary: MEDS WP   Vital Signs:  Patient Profile:   60 Years Old Female Height:     63.5 inches Weight:      185.8 pounds Temp:     98.7 degrees F oral Pulse rate:   101 / minute BP sitting:   201 / 110                Last Flu Vaccine:  Fluvax 3+ (07/27/2007 9:07:05 AM) Flu Vaccine Result Date:  08/23/2008 Flu Vaccine Next Due:  Refused Last HDL:  38 (07/02/2007 8:23:00 PM) HDL Next Due:  1 yr Last LDL:  156 (07/02/2007 8:23:00 PM) LDL Next Due:  1 yr Last Mammogram:  Assessment: BIRADS 1.  (08/14/2007 1:43:17 PM) Mammogram Next Due:  2 yr   PCP:  Sarah Cornfield ALM  MD  Chief Complaint:  needs refill on BP meds - been out for 2 weeks.  History of Present Illness: 60 yo with HTN presents for follow up for first time in a year on her HTN, HLD .  Has been out of meds since being denied due to not having a visit in >23yr.  Was sent in prescription to cover her until today's visit but she did not pick up these meds.  She was not having problems with her blood pressure medicines in terms of dizziness, headaches, palpitations.  she does have occasional chest pain and shortness of breath at baseline.  for the past week she has also noticed some numbness in her R leg.  she has not felt weak in the leg and has had no symptoms in her arms or face or speech.  even while out of her medicines she denies dizziness, headaches, increase in chest pain or shortness of breath or palpitations.  medications and past medical history reviewed with patient and no changes required except as noted.     Current Allergies: ! PENICILLIN    Risk Factors:  PAP Smear History:     Date of Last PAP Smear:  08/24/2007   Mammogram History:     Date of Last Mammogram:  08/14/2007    Review of Systems      See HPI   Physical Exam  General:     Well-developed,well-nourished,in no acute distress; alert,appropriate and cooperative throughout examination.  obese Lungs:     Normal respiratory effort,  chest expands symmetrically. Lungs are clear to auscultation, no crackles or wheezes. Heart:     Normal rate and regular rhythm. S1 and S2 normal without gallop, murmur, click, rub or other extra sounds. Msk:     normal strength in bilateral upper and lower extremities.  normal gait.   Neurologic:     normal speech.  CN 2-12 grossly intact.  A&O x3.      Impression & Recommendations:  Problem # 1:  HYPERTENSION, BENIGN SYSTEMIC (ICD-401.1) Assessment: Deteriorated have refilled her medications and will check creatinine again today.  suspect some of her symptoms may be due to uncontrolled bp off of her meds.  reminded her the importance of routine doctors visits.  will have her given bp being so high today follow up with nurses visit once back on her meds in 7-10 days.  next f/u with me in 3 months or sooner if results from labs or nurse visit worrisome.   Her updated medication list for this problem includes:    Lisinopril-hydrochlorothiazide 20-12.5 Mg Tabs (Lisinopril-hydrochlorothiazide) .Marland Kitchen... 2 pills by mouth once daily    Metoprolol Tartrate 25 Mg Tabs (Metoprolol  tartrate) .Marland Kitchen... 1 tab by mouth two times a day    Norvasc 10 Mg Tabs (Amlodipine besylate) .Marland Kitchen... 1 by mouth once daily  Orders: Basic Met-[facility] ([id number]) [facility]- Est  Level 4 (62952)   Problem # 2:  HYPERLIPIDEMIA (ICD-272.4) Assessment: Deteriorated refilled medication.  is due for cholesterol check.  Her updated medication list for this problem includes:    Lipitor 40 Mg Tabs (Atorvastatin calcium) .Marland Kitchen... Take 1 tab by mouth at bedtime  Orders: Direct LDL-[facility] ([id number]) [facility]- Est  Level 4 ([address]253)   Complete Medication List: 1)  Lisinopril-hydrochlorothiazide 20-12.5 Mg Tabs (Lisinopril-hydrochlorothiazide) .... 2 pills by mouth once daily 2)  Metoprolol Tartrate 25 Mg Tabs (Metoprolol tartrate) .Marland Kitchen.. 1 tab by mouth two times a day 3)  Norvasc 10 Mg Tabs (Amlodipine besylate) .Marland Kitchen.. 1 by mouth once daily 4)   Lipitor 40 Mg Tabs (Atorvastatin calcium) .... Take 1 tab by mouth at bedtime 5)  Omeprazole 20 Mg Cpdr (Omeprazole) .... One by mouth daily 6)  Aspirin 81 Mg Tbec (Aspirin) .... One by mouth every day 7)  Caltrate 600+d 600-400 Mg-unit Tabs (Calcium carbonate-vitamin d) .Marland Kitchen.. 1 by mouth bid   Patient Instructions: 1)  Please make a nurses visit for a blood pressure check in 7-10 days. 2)  Please follow up with me in 3 months to make sure your blood pressure has improved. 3)  I will let you know the results of today's blood test with a phone call or letter in the mail. 4)  It was nice to meet you today.  Together we will help protect your brain, kidneys and heart.    Prescriptions: OMEPRAZOLE 20 MG CPDR (OMEPRAZOLE) one by mouth daily  #30 x 3   Entered and Authorized by:   Ancil Boozer  MD   Signed by:   Ancil Boozer  MD on 08/23/2008   Method used:   Electronically to        [facility]* (retail)       [address]       [place], [place]  [address]       Ph: [phone]       Fax: [phone]   RxID:   [id number] LIPITOR 40 MG TABS (ATORVASTATIN CALCIUM) Take 1 tab by mouth at bedtime  #30 x 3   Entered and Authorized by:   Ancil Boozer  MD   Signed by:   Ancil Boozer  MD on 08/23/2008   Method used:   Electronically to        [facility]* (retail)       [address]       [place], [place]  [address]       Ph: [phone]       Fax: [phone]   RxID:   [id number] NORVASC 10 MG TABS (AMLODIPINE BESYLATE) 1 by mouth once daily  #30 x 3   Entered and Authorized by:   Ancil Boozer  MD   Signed by:   Ancil Boozer  MD on 08/23/2008   Method used:   Electronically to        [facility]* (retail)       [address]       [place], [place]  [address]       Ph: [phone]       Fax: [phone]   RxID:   [id number] METOPROLOL TARTRATE 25 MG TABS (METOPROLOL TARTRATE) 1 tab by mouth two times a day  #60 x 3  Entered and Authorized by:    Ancil Boozer  MD   Signed by:   Ancil Boozer  MD on 08/23/2008   Method used:   Electronically to        [facility]* (retail)       [address]       [place], [place]  [address]       Ph: [phone]       Fax: [phone]   RxID:   [id number] LISINOPRIL-HYDROCHLOROTHIAZIDE 20-12.5 MG  TABS (LISINOPRIL-HYDROCHLOROTHIAZIDE) 2 pills by mouth once daily  #60 x 3   Entered and Authorized by:   Ancil Boozer  MD   Signed by:   Ancil Boozer  MD on 08/23/2008   Method used:   Electronically to        [facility]* (retail)       [address]       [place], [place]  [address]       Ph: [phone]       Fax: [phone]   RxID:   [id number] OMEPRAZOLE 20 MG CPDR (OMEPRAZOLE) one by mouth daily  #30 x 3   Entered and Authorized by:   Ancil Boozer  MD   Signed by:   Ancil Boozer  MD on 08/23/2008   Method used:   Print then Give to Patient   RxID:   [id number] LIPITOR 40 MG TABS (ATORVASTATIN CALCIUM) Take 1 tab by mouth at bedtime  #30 x 3   Entered and Authorized by:   Ancil Boozer  MD   Signed by:   Ancil Boozer  MD on 08/23/2008   Method used:   Print then Give to Patient   RxID:   [id number] NORVASC 10 MG TABS (AMLODIPINE BESYLATE) 1 by mouth once daily  #30 x 3   Entered and Authorized by:   Ancil Boozer  MD   Signed by:   Ancil Boozer  MD on 08/23/2008   Method used:   Print then Give to Patient   RxID:   [id number] METOPROLOL TARTRATE 25 MG TABS (METOPROLOL TARTRATE) 1 tab by mouth two times a day  #60 x 3   Entered and Authorized by:   Ancil Boozer  MD   Signed by:   Ancil Boozer  MD on 08/23/2008   Method used:   Print then Give to Patient   RxID:   [id number] LISINOPRIL-HYDROCHLOROTHIAZIDE 20-12.5 MG  TABS (LISINOPRIL-HYDROCHLOROTHIAZIDE) 2 pills by mouth once daily  #60 x 3   Entered and Authorized by:   Ancil Boozer  MD   Signed by:   Ancil Boozer  MD on 08/23/2008   Method used:   Print then Give to Patient    RxID:   [id number]  ]

## 2010-11-06 NOTE — Assessment & Plan Note (Signed)
Summary: f/up,tcb   Vital Signs:  Patient profile:   60 year old female Height:      63.5 inches Weight:      182 pounds BMI:     31.85 BSA:     1.87 Temp:     57 degrees F Pulse rate:   57 / minute BP sitting:   199 / 97  Vitals Entered By: Jone Baseman CMA (July 11, 2009 8:48 AM) CC: f/u HTN, foot pain, chest pain Is Patient Diabetic? No Pain Assessment Patient in pain? yes     Location: legs Intensity: 8   Primary Care Provider:  Ancil Boozer  MD  CC:  f/u HTN, foot pain, and chest pain.  History of Present Illness: HTN: states she has been taking meds as prescribed though ran out a few days ago.  thinks her blood pressures have been running in systolics 170s when she checks it at home but didn't bring a list today. denies having any problems with her meds - no dizziness or increase in chest pain or leg swelling.  chest pain: has taken NTG a few times when it happened.  this with rest made it go away.  she doesn't recall seeing a cardiologist before.  pain is not happening more frequently or worsening.    foot pain: chronic.  thinks she had a touch of polio as a child but basically has been there as long as she can remember.  only left foot hurts.  she hasn't tried anything to help.  she couldn't get in to see the podiatrist because she has no health insurance or coverage. her change in gait has made her legs ache otherwise.   Habits & Providers  Alcohol-Tobacco-Diet     Alcohol drinks/day: 0     Tobacco Status: never  Current Medications (verified): 1)  Lisinopril-Hydrochlorothiazide 20-12.5 Mg  Tabs (Lisinopril-Hydrochlorothiazide) .... 2 Pills By Mouth Once Daily 2)  Metoprolol Tartrate 25 Mg Tabs (Metoprolol Tartrate) .Marland Kitchen.. 1 Tab By Mouth Two Times A Day 3)  Aspirin 81 Mg  Tbec (Aspirin) .... One By Mouth Every Day 4)  Caltrate 600+d 600-400 Mg-Unit  Tabs (Calcium Carbonate-Vitamin D) .Marland Kitchen.. 1 By Mouth Bid 5)  Nitroglycerin 0.4 Mg Subl (Nitroglycerin) .Marland Kitchen.. 1  Under Tongue Every 5 Min As Needed For Chest Pain.  If Helps Chest Pain Call Physician Right Away. 6)  Lovastatin 20 Mg Tabs (Lovastatin) .Marland Kitchen.. 1 By Mouth At Bedtime For Cholesterol.  Replaces Your Lipitor. 7)  Spironolactone 25 Mg Tabs (Spironolactone) .Marland Kitchen.. 1 By Mouth Once Daily For Blood Pressure. 8)  Diclofenac Sodium 75 Mg Tbec (Diclofenac Sodium) .Marland Kitchen.. 1 By Mouth Two Times A Day For Pain.  Allergies (verified): 1)  ! Penicillin  Past History:  Past medical, surgical, family and social histories (including risk factors) reviewed for relevance to current acute and chronic problems.  Past Medical History: Reviewed history from 01/01/2008 and no changes required.   HYPERLIPIDEMIA (ICD-272.4) OSTEOARTHRITIS, MULTI SITES (ICD-715.98) OBESITY, NOS (ICD-278.00) NEUROPATHY, PERIPHERAL (ICD-356.9) HYPERTENSION, BENIGN SYSTEMIC (ICD-401.1) H/o childhood polio infection L foot neuropathy due to polio R knee injection (6/02)  Past Surgical History: Reviewed history from 12/04/2006 and no changes required. T & A -  Family History: Reviewed history from 01/01/2008 and no changes required. F--CA (unknown cause), M--htn, DM, died of resp failure in her 66's brother and sister w/ htn no fam hx of colon ca, cad  Social History: Reviewed history from 01/01/2008 and no changes required. Single, teaches preschool Quit  smoking 15 yrs ago no alcohol or other drug useSmoking Status:  never  Review of Systems       per HPI  Physical Exam  General:  Well-developed,well-nourished,in no acute distress; alert,appropriate and cooperative throughout examination.  obese Lungs:  Normal respiratory effort, chest expands symmetrically. Lungs are clear to auscultation, no crackles or wheezes. Heart:  Normal rate and regular rhythm. S1 and S2 normal without gallop, murmur, click, rub or other extra sounds. Msk:  bony deformity of left foot with callous formation over lateral edge of foot from abnormal  gait. appears nearly to be club foot.    Impression & Recommendations:  Problem # 1:  HYPERTENSION, BENIGN SYSTEMIC (ICD-401.1) Assessment Unchanged  certainly not at goal even if being complaint.  will start spironolactone - needs recheck of bmet to make sure potassium is okay at next visit.  advised her to write down her bp at home and bring it to her next visit.  couldn't choose another med due to cost.   cannot increase her metoprolol 2/2 low pulse.  continue to monitor closely  Her updated medication list for this problem includes:    Lisinopril-hydrochlorothiazide 20-12.5 Mg Tabs (Lisinopril-hydrochlorothiazide) .Marland Kitchen... 2 pills by mouth once daily    Metoprolol Tartrate 25 Mg Tabs (Metoprolol tartrate) .Marland Kitchen... 1 tab by mouth two times a day    Spironolactone 25 Mg Tabs (Spironolactone) .Marland Kitchen... 1 by mouth once daily for blood pressure.  Orders: [facility]- Est  Level 4 ([address])  Problem # 2:  CHEST PAIN (ICD-786.50) Assessment: Unchanged  worrisome given improvement with NTG that it could be cardiac in nature.  will refer to cardiologist once she has some medical coverage.  i have STRONGLY encouraged her to apply to [facility] to try to facilitate this process.   Orders: [facility]- Est  Level 4 ([address])  Problem # 3:  FOOT DEFORMITY, ACQUIRED (ICD-736.70) Assessment: Deteriorated would prefer podiatry appt but no one will take her when she is uninsured.  consider referral for orthotics to try to take her weight bearing and distrubite it more evenly on left foot.  in the mean time to try to help inflammation and pain will try diclofenac.     Orders: [facility]- Est  Level 4 ([address]11)  Complete Medication List: 1)  Lisinopril-hydrochlorothiazide 20-12.5 Mg Tabs (Lisinopril-hydrochlorothiazide) .... 2 pills by mouth once daily 2)  Metoprolol Tartrate 25 Mg Tabs (Metoprolol tartrate) .Marland Kitchen.. 1 tab by mouth two times a day 3)  Aspirin 81 Mg Tbec (Aspirin) .... One by mouth every day 4)  Caltrate 600+d  600-400 Mg-unit Tabs (Calcium carbonate-vitamin d) .Marland Kitchen.. 1 by mouth bid 5)  Nitroglycerin 0.4 Mg Subl (Nitroglycerin) .Marland Kitchen.. 1 under tongue every 5 min as needed for chest pain.  if helps chest pain call physician right away. 6)  Lovastatin 20 Mg Tabs (Lovastatin) .Marland Kitchen.. 1 by mouth at bedtime for cholesterol.  replaces your lipitor. 7)  Spironolactone 25 Mg Tabs (Spironolactone) .Marland Kitchen.. 1 by mouth once daily for blood pressure. 8)  Diclofenac Sodium 75 Mg Tbec (Diclofenac sodium) .Marland Kitchen.. 1 by mouth two times a day for pain.  Patient Instructions: 1)  For your blood pressure- please follow up with me in 1 month.  Please write down your blood pressure at home and bring that list and all of your medicines to your next appointment. 2)  Be sure to pick up your new medicine at [facility] for your blood pressure.  There is also a new medicine there for your foot pain.  3)  Please apply to [facility] to see if we can get you some coverage to help your foot pain and so that we can get you in to a cardiologist to check out your heart.   4)  It was nice to see you today! Prescriptions: DICLOFENAC SODIUM 75 MG TBEC (DICLOFENAC SODIUM) 1 by mouth two times a day for pain.  #60 x 11   Entered and Authorized by:   Ancil Boozer  MD   Signed by:   Ancil Boozer  MD on 07/11/2009   Method used:   Electronically to        [facility] [phone]* (retail)       [address]       [place], [place]  [address]       Ph: [phone]       Fax: [phone]   RxID:   [id number] METOPROLOL TARTRATE 25 MG TABS (METOPROLOL TARTRATE) 1 tab by mouth two times a day  #60 x 3   Entered and Authorized by:   Ancil Boozer  MD   Signed by:   Ancil Boozer  MD on 07/11/2009   Method used:   Electronically to        [facility] [phone]* (retail)       [address]       [place], [place]  [address]       Ph: [phone]       Fax: [phone]   RxID:   [id number] LISINOPRIL-HYDROCHLOROTHIAZIDE 20-12.5 MG  TABS  (LISINOPRIL-HYDROCHLOROTHIAZIDE) 2 pills by mouth once daily  #60 x 3   Entered and Authorized by:   Ancil Boozer  MD   Signed by:   Ancil Boozer  MD on 07/11/2009   Method used:   Electronically to        [facility] [phone]* (retail)       [address]       [place], [place]  [address]       Ph: [phone]       Fax: [phone]   RxID:   [id number] SPIRONOLACTONE 25 MG TABS (SPIRONOLACTONE) 1 by mouth once daily for blood pressure.  #30 x 11   Entered and Authorized by:   Ancil Boozer  MD   Signed by:   Ancil Boozer  MD on 07/11/2009   Method used:   Electronically to        [facility] [phone]* (retail)       [address]       [place], [place]  [address]       Ph: [phone]       Fax: [phone]   RxID:   [id number] LOVASTATIN 20 MG TABS (LOVASTATIN) 1 by mouth at bedtime for cholesterol.  replaces your lipitor.  #30 x 11   Entered and Authorized by:   Ancil Boozer  MD   Signed by:   Ancil Boozer  MD on 07/11/2009   Method used:   Electronically to        [facility] [phone]* (retail)       [address]       [place], [place]  [address]       Ph: [phone]       Fax: [phone]   RxID:   [id number]

## 2010-11-06 NOTE — Letter (Signed)
Summary: Results Follow-up Letter  [facility]  [address]   [place], [place] [address]   Phone: [phone]  Fax: [phone]    07/03/2007  [address] [place], [place]  [address]  Dear Ms. Holden,   The following are the results of your recent test(s):  Test     Result     Pap Smear    Normal_______  Not Normal_____       Comments: _________________________________________________________ Cholesterol LDL(Bad cholesterol): 156         Your goal is less than: 130        HDL (Good cholesterol): 38       Your goal is more than:50 _________________________________________________________ Other Tests:   _________________________________________________________  Please call for an appointment Or __Please follow up in 2-4 weeks as previously discussed.  We may need to start a cholesterol medicine._______________________________________________________ _________________________________________________________ _________________________________________________________  Sincerely,  Benn Moulder MD [facility]           Appended Document: Results Follow-up Letter patient letter mailed

## 2010-11-06 NOTE — Progress Notes (Signed)
Summary: phn msg   Phone Note Call from Patient   Caller: Patient Summary of Call: pt unable to make appt today due to transportation issue. Initial call taken by: Clydell Hakim,  August 16, 2009 9:23 AM

## 2010-11-06 NOTE — Progress Notes (Signed)
Summary: Rx Req   Phone Note Call from Patient Call back at [phone]   Caller: Patient Summary of Call: pt needs Lipator.  Would like for it to be called into the [facility] in [place].   Initial call taken by: Clydell Hakim,  April 28, 2009 9:51 AM  Follow-up for Phone Call        will forward message to MD. Follow-up by: Theresia Lo RN,  April 28, 2009 9:53 AM  Additional Follow-up for Phone Call Additional follow up Details #1::        if she can get [facility] for just 1 refill.  she needs an appointment to discuss her blood pressure and chronic medical issues.  thanks  Additional Follow-up by: Ancil Boozer  MD,  April 28, 2009 9:53 AM    Prescriptions: LIPITOR 40 MG TABS (ATORVASTATIN CALCIUM) Take 1 tab by mouth at bedtime  #30 x 0   Entered by:   Theresia Lo RN   Authorized by:   Ancil Boozer  MD   Signed by:   Theresia Lo RN on 04/28/2009   Method used:   Telephoned to ...         RxID:   [id number]  called patient to have her get phone number and she will call back . advised of message from MD. Theresia Lo RN  April 28, 2009 11:21 AM    attempted to call patient , no answer. Theresia Lo RN  May 02, 2009 2:29 PM    called to above number and her sister states patient has gone back to [facility] and has gotten her medication. unable to call patient as the number in [facility] is  out of service. will await patient to call back if she needs refill. Theresia Lo RN  May 08, 2009 2:15 PM

## 2010-11-06 NOTE — Assessment & Plan Note (Signed)
Summary: MEET NEW DOC/PE/REFILLS ON MEDS/[facility]  Medications Added LISINOPRIL-HYDROCHLOROTHIAZIDE 20-12.5 MG  TABS (LISINOPRIL-HYDROCHLOROTHIAZIDE) 1 by mouth once daily      Allergies Added: NKDA  Vital Signs:  Patient Profile:   60 Years Old Female Weight:      191 pounds Temp:     97.9 degrees F Pulse rate:   56 / minute BP sitting:   182 / 98  Pt. in pain?   no  Vitals Entered By: Jone Baseman CMA (July 02, 2007 9:04 AM)                  Chief Complaint:  meet new MD and med refills.  History of Present Illness: Hypertension      Using medication (hctz 25 and metoprolol 25 bid):  _x_ without problems  Lightheadedness:  __x none Chest pain with exertion: _x_ none  Headaches:  __x_ none Vision changes:  __x_ none Average home BPs: does not check. She has not followed up for her HTN in well over a year.  She has not had a pap smear or mammogram in several years. No colon ca screening yet. No fam hx of colon cancer.    Current Allergies: No known allergies   Past Medical History:    Reviewed history from 04/29/2007 and no changes required:       H/o childhood polio infection       L foot neuropathy due to polio       R knee injection (6/02)   Family History:    F--CA (unknown cause), M--htn, DM, died of resp failure    brother and sister w/ htn    no fam hx of colon ca, cad  Social History:    Single, teaches preschool    Quit smoking 15 yrs ago    no alchohol or other drug use   Risk Factors:  Tobacco use:  quit    Year quit:  1993   Review of Systems       no hematuria, no weakness  GU      Denies abnormal vaginal bleeding and discharge.   Physical Exam  General:     Well-developed,well-nourished,in no acute distress; alert,appropriate and cooperative throughout examination Eyes:     No corneal or conjunctival inflammation noted. EOMI. Perrla. Funduscopic exam benign, without hemorrhages, exudates or papilledema. Vision grossly  normal. Mouth:     pharynx pink and moist.   Neck:     no carotid bruits. No thyromegaly or thyroid nodules Lungs:     Normal respiratory effort, chest expands symmetrically. Lungs are clear to auscultation, no crackles or wheezes. Heart:     Normal rate and regular rhythm. S1 and S2 normal without gallop, murmur, click, rub or other extra sounds. Abdomen:     Bowel sounds positive,abdomen soft and non-tender without masses, organomegaly or hernias noted. Pulses:     R radial normal, R carotid normal, L radial normal, and L carotid normal.   Extremities:     No clubbing, cyanosis, edema, or deformity noted with normal full range of motion of all joints.   Neurologic:     alert & oriented X3.      Impression & Recommendations:  Problem # 1:  HYPERTENSION, BENIGN SYSTEMIC (ICD-401.1) Assessment: Deteriorated BP severely elevated.  No signs/sx of htn emergency.  Will add lisinopril and recheck bp in 2 weeks.  Repeat kidney function test at this time.  will likely need to increased lisinopril dose.  Consider addition of  norvasc.  Diet/exercise. Her updated medication list for this problem includes:    Lisinopril-hydrochlorothiazide 20-12.5 Mg Tabs (Lisinopril-hydrochlorothiazide) .Marland Kitchen... 1 by mouth once daily    Metoprolol Tartrate 25 Mg Tabs (Metoprolol tartrate) .Marland Kitchen... 1 tab by mouth two times a day  Orders: [facility]- Est  Level 4 ([address]214) Comp Met-[facility] ([address]) Lipid-[facility] ([address]-91478)   Problem # 2:  Preventive Health Care (ICD-V70.0) Assessment: Comment Only Patient given handout on mammogram. discussed colon cancer screening. Pap at next visit.  Time spent w/ patient was > 25 minutes going over preventative healthcare as well as risks associated with uncontrolled hypertension.  Complete Medication List: 1)  Lisinopril-hydrochlorothiazide 20-12.5 Mg Tabs (Lisinopril-hydrochlorothiazide) .Marland Kitchen.. 1 by mouth once daily 2)  Metoprolol Tartrate 25 Mg Tabs (Metoprolol tartrate) .Marland Kitchen.. 1 tab  by mouth two times a day   Patient Instructions: 1)  Please schedule a follow-up appointment in 2 weeks. 2)  Lisinopril-hydrochlorothiazide replaces hydrochlorothiazide 3)  Con't metoprolol 4)  Schedule your mammogram. 5)  Conisder a colonoscopy/sigmoidoscopy to help detect colon cancer.    Prescriptions: METOPROLOL TARTRATE 25 MG TABS (METOPROLOL TARTRATE) 1 tab by mouth two times a day  #60 x 1   Entered and Authorized by:   Benn Moulder MD   Signed by:   Benn Moulder MD on 07/02/2007   Method used:   Electronically sent to ...       [facility] [address]       [address]       [place], [place]  [address]       Ph: [phone]       Fax: [phone]   RxID:   [id number] LISINOPRIL-HYDROCHLOROTHIAZIDE 20-12.5 MG  TABS (LISINOPRIL-HYDROCHLOROTHIAZIDE) 1 by mouth once daily  #30 x 1   Entered and Authorized by:   Benn Moulder MD   Signed by:   Benn Moulder MD on 07/02/2007   Method used:   Electronically sent to ...       [facility] [address]       [address]       [place], [place]  [address]       Ph: [phone]       Fax: [phone]   RxID:   [id number]  ]

## 2010-11-06 NOTE — Miscellaneous (Signed)
Summary: denied rxs   Clinical Lists Changes patient needs to schedule apppt for follow-up. has not been seen in >6 months. rx for metoprolol denied. Lequita Asal  MD  July 27, 2008 2:50 PM

## 2010-11-06 NOTE — Miscellaneous (Signed)
Summary: cardiolyte   Clinical Lists Changes  Observations: Added new observation of NUCST CONC: EF 68% No perfusion defects. Normal study. (01/12/2008 12:06)       Nuclear ETT  Procedure date:  01/12/2008  Findings:      EF 68% No perfusion defects. Normal study.   Nuclear ETT  Procedure date:  01/12/2008  Findings:      EF 68% No perfusion defects. Normal study.

## 2010-11-06 NOTE — Assessment & Plan Note (Signed)
Summary: sore throat , cough/ls   Vital Signs:  Patient profile:   60 year old female Height:      65 inches Weight:      184 pounds BMI:     30.73 Temp:     98.4 degrees F Pulse rate:   76 / minute BP sitting:   173 / 100  (left arm) Cuff size:   regular  Vitals Entered By: Dennison Nancy RN (September 05, 2010 9:36 AM)  Serial Vital Signs/Assessments:  Time      Position  BP       Pulse  Resp  Temp     By                     166/104                        Paula Compton MD  Comments: manual, L arm sitting in exam room By: Paula Compton MD   CC: WI for sore throat and prod cough Is Patient Diabetic? No Pain Assessment Patient in pain? no        Primary Care [facility]:  Ancil Boozer  MD  CC:  [place] for sore throat and prod cough.  History of Present Illness: Patient seen with [facility] med student Levert Feinstein.  Patient here for same-day visit, for sore throat and cough tinged with blood since 11/27. One day of blood tinged sputum with forceful coughing.    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  Kids sick at work.   No fevers, no abd pain, no bowel changes, no urinary changes or dysuria.  Has had some tingling in the R hand (right hand dominant) off and on, without weakness, since 11/28.  No vision changes or hearing changes, no ear pain.    Was aroudn her sister, who smokes, for [place]anksgiving weekend.  Mother died 2 yrs ago from complications of DM; also had HTN.    Patient reports taking her antihypertensives as prescribed, did not bring meds nor is able to recite names or instructions on her meds.   Last [prescribed on March 2011 visit, with 3 months of refills.  She reports she still has meds left.  Does not know why she had been taken off the Norvasc last August (denies side effects).   Habits & Providers  Alcohol-Tobacco-Diet     Alcohol drinks/day: 0     Tobacco Status: never     Year Quit: 1993  Current Medications (verified): 1)   Lisinopril-Hydrochlorothiazide 20-12.5 Mg  Tabs (Lisinopril-Hydrochlorothiazide) .... 2 Pills By Mouth Once Daily 2)  Metoprolol Tartrate 25 Mg Tabs (Metoprolol Tartrate) .Marland Kitchen.. 1 Tab By Mouth Two Times A Day 3)  Aspirin 81 Mg  Tbec (Aspirin) .... One By Mouth Every Day 4)  Caltrate 600+d 600-400 Mg-Unit  Tabs (Calcium Carbonate-Vitamin D) .Marland Kitchen.. 1 By Mouth Bid 5)  Lovastatin 20 Mg Tabs (Lovastatin) .Marland Kitchen.. 1 By Mouth At Bedtime For Cholesterol.  Replaces Your Lipitor. 6)  Spironolactone 25 Mg Tabs (Spironolactone) .Marland Kitchen.. 1 By Mouth Once Daily For Blood Pressure. 7)  Diclofenac Sodium 75 Mg Tbec (Diclofenac Sodium) .Marland Kitchen.. 1 By Mouth Two Times A Day For Pain.  Allergies (verified): 1)  ! Penicillin  Physical Exam  General:  Generally well appearing, no apparent distress Eyes:  conjuntivae not injected.  PERRL Ears:  TMs occluded by cerumen. No periauricular nodes Nose:  No sinus tenderness.  Light blue-hued nasal mucosa wihtout rhinorrhea  Mouth:  Clear oropharynx, no exudates.  Moist mucus membranes Neck:  Neck supple,. perhaps mild shotty cervical adenopathy more prominent along R anterior cervical chain.  Lungs:  Normal respiratory effort, chest expands symmetrically. Lungs are clear to auscultation, no crackles or wheezes. Heart:  Normal rate and regular rhythm. S1 and S2 normal without gallop, murmur, click, rub or other extra sounds. Abdomen:  Soft nontender.  Nondistended.  No masses.  No bruits heard over renal arteries.  Msk:  No lower ext edema.   Pulses:  palpable radial pulses bilat Extremities:  Negative phalens and tinnels signs bilat.  Handgrip and strength full and symmetric, sensation full and symmetric grossly to light touch   Impression & Recommendations:  Problem # 1:  UPPER RESPIRATORY INFECTION, VIRAL (ICD-465.9)  Symptoms and findings consistent with URI.  Supportive care; mucolytics, fluids. Red flags for followup. Her updated medication list for this problem includes:     Aspirin 81 Mg Tbec (Aspirin) ..... One by mouth every day    Diclofenac Sodium 75 Mg Tbec (Diclofenac sodium) .Marland Kitchen... 1 by mouth two times a day for pain.  Orders: [facility]- Est  Level 4 (50093)  Problem # 2:  HYPERTENSION, BENIGN SYSTEMIC (ICD-401.1) Poorly controlled.  Suspect poor adherence to BP meds, given dates of refills (last 3-mo refill given in March 2011; patient claims she is still taking).  Cannot give names or instructiosn for her meds.  Did not take today.  Refills issued, lytes/CBC/renal fxn checked today.  Stressed need to see PCP in this office to discuss importance of BP control, screening tests (is due for colon/breast/cervical cancer screening).  Her updated medication list for this problem includes:    Lisinopril-hydrochlorothiazide 20-12.5 Mg Tabs (Lisinopril-hydrochlorothiazide) .Marland Kitchen... 2 pills by mouth once daily    Metoprolol Tartrate 25 Mg Tabs (Metoprolol tartrate) .Marland Kitchen... 1 tab by mouth two times a day    Spironolactone 25 Mg Tabs (Spironolactone) .Marland Kitchen... 1 by mouth once daily for blood pressure.  Orders: Comp Met-[facility] [id number]) CBC-[facility] (96789) Direct LDL-[facility] [phone]) [facility]- Est  Level 4 (58527)  Complete Medication List: 1)  Lisinopril-hydrochlorothiazide 20-12.5 Mg Tabs (Lisinopril-hydrochlorothiazide) .... 2 pills by mouth once daily 2)  Metoprolol Tartrate 25 Mg Tabs (Metoprolol tartrate) .Marland Kitchen.. 1 tab by mouth two times a day 3)  Aspirin 81 Mg Tbec (Aspirin) .... One by mouth every day 4)  Caltrate 600+d 600-400 Mg-unit Tabs (Calcium carbonate-vitamin d) .Marland Kitchen.. 1 by mouth bid 5)  Lovastatin 20 Mg Tabs (Lovastatin) .Marland Kitchen.. 1 by mouth at bedtime for cholesterol.  replaces your lipitor. 6)  Spironolactone 25 Mg Tabs (Spironolactone) .Marland Kitchen.. 1 by mouth once daily for blood pressure. 7)  Diclofenac Sodium 75 Mg Tbec (Diclofenac sodium) .Marland Kitchen.. 1 by mouth two times a day for pain.  Patient Instructions: 1)  It was a pleasure to see you today.  We believe your cough and sore throat  is most likely due to a viral upper respiratory illness, which will run its course.  In the meantime, we recommend that you take mucinex 600mg  by mouth, twice daily.  Also, plenty of fluids. 2)  Please contact our office if your cough and sore throat persist.   3)  For your blood pressure, we recommend that you take your medications as prescribed.  New prescriptions were [facility] to [facility] [address]. 4)  Please make an appointment with Dr. Lula Olszewski (your new doctor here) for blood pressure check and to discuss screening tests for colon  cancer, cervical cancer and breast cancer.  5)  MAKE APPT WITH DR CHAMBERLAIN IN THE COMING 1 MONTH Prescriptions: SPIRONOLACTONE 25 MG TABS (SPIRONOLACTONE) 1 by mouth once daily for blood pressure.  #30 x 1   Entered and Authorized by:   Paula Compton MD   Signed by:   Paula Compton MD on 09/05/2010   Method used:   Electronically to        [facility] [phone]* (retail)       [address]       [place], [place]  [address]       Ph: [phone]       Fax: [phone]   RxID:   [id number] LOVASTATIN 20 MG TABS (LOVASTATIN) 1 by mouth at bedtime for cholesterol.  replaces your lipitor.  #30 x 3   Entered and Authorized by:   Paula Compton MD   Signed by:   Paula Compton MD on 09/05/2010   Method used:   Electronically to        [facility] [phone]* (retail)       [address]       [place], [place]  [address]       Ph: [phone]       Fax: [phone]   RxID:   [id number] METOPROLOL TARTRATE 25 MG TABS (METOPROLOL TARTRATE) 1 tab by mouth two times a day  #60 x 3   Entered and Authorized by:   Paula Compton MD   Signed by:   Paula Compton MD on 09/05/2010   Method used:   Electronically to        [facility] [phone]* (retail)       [address]       [place], [place]  [address]       Ph: [phone]       Fax: [phone]   RxID:   [id number] LISINOPRIL-HYDROCHLOROTHIAZIDE 20-12.5 MG  TABS  (LISINOPRIL-HYDROCHLOROTHIAZIDE) 2 pills by mouth once daily  #60 x 3   Entered and Authorized by:   Paula Compton MD   Signed by:   Paula Compton MD on 09/05/2010   Method used:   Electronically to        [facility] [phone]* (retail)       [address]       [place], [place]  [address]       Ph: [phone]       Fax: [phone]   RxID:   [id number]    Orders Added: 1)  Comp Met-[facility] [[address]7989-21194] 2)  CBC-[facility] [85027] 3)  Direct LDL-[facility] [17[address]-14481] 4)  [facility]- Est  Level 4 [85631]     Prevention & Chronic Care Immunizations   Influenza vaccine: Fluvax 3+  (07/27/2007)   Influenza vaccine due: 07/26/2008    Tetanus booster: 02/04/1990: Done.   Tetanus booster due: 02/05/2000    Pneumococcal vaccine: Not documented  Colorectal Screening   Hemoccult: negative  (08/24/2007)   Hemoccult due: 08/23/2008    Colonoscopy: Not documented   Colonoscopy due: Not Indicated  Other Screening   Pap smear: normal  (08/24/2007)   Pap smear due: 08/23/2010    Mammogram: Assessment: BIRADS 1.   (08/14/2007)   Mammogram action/deferral: Screening mammogram in 1 year.     (08/14/2007)   Mammogram due: 08/13/2009   Smoking status: never  (09/05/2010)  Lipids   Total Cholesterol: 125  (05/23/2009)   Lipid panel action/deferral: Lipid Panel ordered   LDL: 65  (05/23/2009)  LDL Direct: 156  (08/23/2008)   HDL: 39  (05/23/2009)   Triglycerides: 105  (05/23/2009)   Lipid panel due: 05/23/2010    SGOT (AST): 25  (05/23/2009)   SGPT (ALT): 23  (05/23/2009) CMP ordered    Alkaline phosphatase: 70  (05/23/2009)   Total bilirubin: 0.6  (05/23/2009)   Liver panel due: 05/23/2010  Hypertension   Last Blood Pressure: 173 / 100  (09/05/2010)   Serum creatinine: 0.77  (05/23/2009)   Serum potassium 3.9  (05/23/2009) CMP ordered    Basic metabolic panel due: 05/23/2010  Self-Management Support :    Hypertension self-management support: Not documented    Lipid  self-management support: Not documented

## 2010-11-06 NOTE — Progress Notes (Signed)
Summary: Triage   Phone Note Call from Patient Call back at Home Phone [phone]   Reason for Call: Talk to Nurse Summary of Call: pt wants to talk to rn about knee feeling like there is fluid on it. Initial call taken by: Haydee Salter,  June 15, 2008 2:56 PM  Follow-up for Phone Call        has had this before & had fluid drained out. numbness in area. 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 told her to elevate, ice and take tyl or ibu  Follow-up by: Golden Circle RN,  June 15, 2008 3:25 PM

## 2010-11-06 NOTE — Letter (Signed)
Summary: Results Follow-up Letter  [facility]  [address]   [place], [place] [address]   Phone: [phone]  Fax: [phone]    08/31/2007  [address] [place], [place]  [address]  Dear Ms. Varnadore,   The following are the results of your recent test(s):  Test     Result     Pap Smear    Normal_____x__  Not Normal_____       Comments: _________________________________________________________ Pap smear was normal. Please follow up as previously discussed.  Sincerely,  Benn Moulder MD [facility]           Appended Document: Results Follow-up Letter sent

## 2010-11-06 NOTE — Miscellaneous (Signed)
Summary: preventive care update   Clinical Lists Changes  Observations: Added new observation of FLEXSIGDUE: Not Indicated (06/21/2008 8:59) Added new observation of COLONNXTDUE: Not Indicated (06/21/2008 8:59)     Flex Sig Next Due:  Not Indicated Colonoscopy Next Due:  Not Indicated

## 2010-11-08 NOTE — Miscellaneous (Signed)
  Medications Added ENALAPRIL-HYDROCHLOROTHIAZIDE 10-25 MG TABS (ENALAPRIL-HYDROCHLOROTHIAZIDE) Take 1 tablet by mouth twice a day       Clinical Lists Changes  Problems: Removed problem of SCREENING EXAMINATION FOR PULMONARY TUBERCULOSIS (ICD-V74.1) Removed problem of DIZZINESS (ICD-780.4) Changed problem from CHEST PAIN (ICD-786.50) to History of  CHEST PAIN (ICD-786.50) Removed problem of SCREENING FOR MALIGNANT NEOPLASM OF THE CERVIX (ICD-V76.2) Medications: Added new medication of ENALAPRIL-HYDROCHLOROTHIAZIDE 10-25 MG TABS (ENALAPRIL-HYDROCHLOROTHIAZIDE) Take 1 tablet by mouth twice a day Observations: Added new observation of LLIMPORTMEDS: completed (10/02/2010 8:35)

## 2011-01-10 ENCOUNTER — Other Ambulatory Visit: Payer: Self-pay | Admitting: Family Medicine

## 2011-01-10 DIAGNOSIS — I10 Essential (primary) hypertension: Secondary | ICD-10-CM

## 2011-01-10 NOTE — Telephone Encounter (Signed)
appointment scheduled for 01/31/2011 . Advised patient will send in enough   spironalactone  to last until that appointment.

## 2011-01-31 ENCOUNTER — Encounter: Payer: Self-pay | Admitting: Family Medicine

## 2011-02-19 NOTE — H&P (Signed)
NAME:  Sarah [name], Sarah [name] NO.:  [id number]   MEDICAL RECORD NO.:  [id number]          PATIENT TYPE:  OBV   LOCATION:  3734                         FACILITY:  [facility]   PHYSICIAN:  Pearlean Brownie, M.D.DATE OF BIRTH:  12-Apr-1951   DATE OF ADMISSION:  01/01/2008  DATE OF DISCHARGE:  01/02/2008                              HISTORY & PHYSICAL   PRIMARY CARE PHYSICIAN:  [facility].   CHIEF COMPLAINT:  Chest pain.   HISTORY OF PRESENT ILLNESS:  The patient is a 60 year old female with  history a history of poorly controlled hypertension, hyperlipidemia, as  well as a 20-pack-year smoking history (quit 15 years ago) who presented  to the [facility] today for routine follow-up.  She notes  that over the past 10 days she has been having new left-sided chest  pain.  The pain is described as a tightness and is nonradiating.  It  lasts for 5-10 minutes and has been occurring about every-other day or  so.  The chest pain comes on at rest and is nonexertional but it is  relieved with rest and with aspirin.  She has had shortness of breath as  well as diaphoresis with the chest pain.  She denies any nausea or  vomiting.  She is without any recent travel, immobilization, calf pain  or swelling.  She denies any recent illnesses, coughs or fevers.  Pain  is unrelated to food ingestion.  She has no history of GERD.  She denies  any anxiety or panic attacks.  Of note, she has been taking her  lisinopril/hydrochlorothiazide as well as metoprolol for high blood  pressure but is not taking her Norvasc.   PAST MEDICAL HISTORY:  1. Hyperlipidemia.  2. Hypertension.  3. Peripheral neuropathy.  4. Obesity.  5. Osteoarthritis.   PAST SURGICAL HISTORY:  Tonsillectomy and adenoidectomy.   MEDICATIONS:  1. Lisinopril/hydrochlorothiazide 20/12.5 two tablets p.o. once daily.  2. Metoprolol 25 mg p.o. b.i.d.  3. Norvasc 10 mg p.o. once daily.   ALLERGIES:  No  known drug allergies.   FAMILY HISTORY:  Father died of unknown cancer.  Mother had  hypertension, diabetes and died of respiratory failure in her 5s.  Brother and sister have hypertension but there is no family history of  colon cancer or coronary artery disease.   SOCIAL HISTORY:  She is single and teaches preschool.  She quit smoking  15 years ago.  Denies alcohol or other drug use.   REVIEW OF SYSTEMS:  Denies fever, orthopnea, syncope, palpitation,  cough, pleuritic pain, wheezing, abdominal pain, bloody stools, nausea,  vomiting, hematuria.  She does note occasional brief headache across her  forehead but is without nausea, vomiting or photophobia.  No headache  currently.  Psych:  Denies anxiety.   PHYSICAL EXAMINATION:  VITAL SIGNS:  Weight 184.5, temperature 98.1,  pulse 57, blood pressure 189/87, respiratory rate 18.  GENERAL:  Alert, no acute distress.  HEENT:  Mucous membranes are moist.  Pupils equally round and reactive  to light and accommodation.  Extraocular motion intact.  Normal  optic  fundi.  NECK:  No carotid bruits.  CHEST:  Chest pain is nonreproducible.  LUNGS:  Clear to auscultation bilaterally.  No wheezes, rales or  rhonchi.  HEART:  Regular rate and rhythm.  No murmurs, rubs or gallops.  ABDOMEN:  Positive bowel sounds, soft, nontender, nondistended.  EXTREMITIES:  No cyanosis, clubbing or edema; 2+ dorsalis pedis pulses.  NEURO EXAM:  Cranial nerves II-XII grossly intact, 5/5 strength in all  extremities.  Gait normal.  PSYCH:  Alert and oriented x3.  Not anxious or depressed appearing.   EKG:  Normal sinus rhythm at 75 beats per minute.  No ST elevations in  contiguous leads.   ASSESSMENT AND PLAN:  1. Chest pain.  New onset left-sided sided chest pain occurring at      rest associated with shortness of breath and diaphoresis.  It is      nonexertional, improves with rest and aspirin.  Last episode was      this morning.  She has no current chest  pain.  Concern for possible      unstable angina given risk factors for poorly controlled      hypertension, smoking history and hyperlipidemia.  She has no risk      factors for pulmonary embolus.  Will admit to telemetry, cycle      cardiac enzymes and risk stratify.  She is on a beta blocker.  Will      give aspirin 325 and start a heparin drip.  Nitroglycerin p.r.n.      pain.  Will start Protonix for possible GI etiology.  Will discuss      cardiology possible cardiac workup if she ruled out for MI.  2. Hypertension.  Blood pressure well above goal.  She has not been      taking Norvasc.  We will add this.  Unable to push beta blocker      further due to heart rate.  Will follow blood pressures closely      while inpatient and adjust medications as needed.  May need to add      spironolactone if not improving.  3. Hyperlipidemia.  LDL is 156 when last checked.  She was working on      diet and exercise and was due for a recheck of lipids today.  Will      check fasting lipid panel and will start Lipitor.      Benn Moulder, M.D.  Electronically Signed      Pearlean Brownie, M.D.  Electronically Signed    MR/MEDQ  D:  01/01/2008  T:  01/02/2008  Job:  [id number]

## 2011-02-19 NOTE — Discharge Summary (Signed)
NAME:  Sarah [name], Sarah [name]                   ACCOUNT NO.:  [id number]   MEDICAL RECORD NO.:  [id number]          PATIENT TYPE:  OBV   LOCATION:  3734                         FACILITY:  [facility]   PHYSICIAN:  Wayne A. Sheffield Slider, M.D.    DATE OF BIRTH:  Jan 22, 1951   DATE OF ADMISSION:  01/01/2008  DATE OF DISCHARGE:  01/02/2008                               DISCHARGE SUMMARY   PRIMARY CARE PHYSICIAN:  Benn Moulder, MD, at [facility].   DISCHARGE DIAGNOSES:  1. Chest pain, atypical.  2. Hypertension.  3. Hyperlipidemia.  4. Osteoarthritis.  5. Obesity.  6. Peripheral neuropathy, secondary to childhood polio infection.   DISCHARGE MEDICATIONS:  1. Norvasc 10 mg p.o. daily, prescription provided.  2. Lisinopril/hydrochlorothiazide 20/12.5 mg 2 pills p.o. daily,      prescription provided.  3. Metoprolol 25 mg p.o. b.i.d., prescription provided.  4. Lipitor 40 mg p.o. daily, prescription provided.  5. Omeprazole 20 mg  p.o. daily, prescription provided.  6. Aspirin 81 mg p.o. daily.  7. Calcium and vitamin D 1 pill p.o. b.i.d., prescription provided.   PROCEDURES AND STUDIES:  1. EKG showed normal sinus rhythm with no acute ischemic changes.  2. Chest x-ray on January 01, 2008 showed no acute cardiac or pulmonary      processes.   BRIEF HOSPITAL COURSE:  This is a 60 year old African American female  with history of hypertension, hyperlipidemia, remote history of tobacco  abuse, and obesity who presented with a 10-day history of intermittent  left-sided chest tightness, which was nonexertional and came on at rest.  The patient was not experiencing chest pain at the time of discharge;  however, was admitted for the concern for the possibility of unstable  angina given risk factors.   PROBLEMS:  1. Chest pain:  The patient was admitted for rule out for acute      myocardial ischemia.  Initial EKG showed no acute ischemic changes,      and the cardiac enzymes were negative  x3.  Repeat EKG again showed      no acute ischemic changes and chest x-ray was negative.  The      patient was initially placed on heparin drip prior to being ruled      out for cardiac ischemia, and [facility] was      consulted for the patient given her concern for unstable angina and      risk factors.  They recommended further outpatient workup with a      stress exercise treadmill test.  2. Hypertension:  The patient was fairly hypertensive on admission      with blood pressures in the 180s-190s/80s-90s.  After being started      on her home blood pressure medications, her blood pressure quickly      normalized, was in the 110s-120s systolic at the time of discharge.      No new blood pressure medications were added.  3. Hyperlipidemia:  The patient was initiated on statin therapy with      Lipitor 40  mg p.o. daily during this hospitalization as her LDL was      found to be well above goal at 157, and the patient had already      attempted 3-6 months of diet and exercise therapy or a modification      in the outpatient setting without success.  4. Heartburn:  The patient was initiated on proton pump inhibitor      therapy as this may be contributing to chest pain.   LABORATORY DATA:  Her electrolytes remained stable throughout admission.  She has normal kidney function with a discharge creatinine of 0.87.  Her  complete blood count prior to discharge revealed a normal white blood  cell count of 5.7, a hemoglobin of 11.9, and platelets of 209.  Cardiac  panels as stated above were negative x3.  The patient's fasting lipid  panel on admission revealed a total cholesterol of 213, triglycerides of  113, HDL of 33, and LDL of 157.   DISCHARGE INSTRUCTIONS:  The patient was instructed to take all of  medications and advised of the importance of adequate blood pressure  control as well as daily aspirin in light of her chest pain.  She was  also advised that further  cardiac workup is necessary to exclude  coronary artery disease as a contributing factor.  [facility] is willing to see her for outpatient stress  testing, which can be arranged with Dr. Mariah Milling, phone number [phone].  Would recommend a stress test to be scheduled within 1-2 weeks of  discharge.  The patient is to follow a low-sodium heart-healthy diet,  increase activity slowly and avoid any activity that causes chest pain,  shortness of breath, dizziness, sweating, or excessive weakness.   FOLLOWUP APPOINTMENT:  1. The patient is to call the [facility] on Monday to      schedule a followup appointment with Dr. Benn Moulder in 1-2 weeks      after discharge.  2. Dr. Seleta Rhymes will assist with arranging for a followup with      [facility] at [phone] for an appointment      in the next 1-2 weeks to receive an outpatient exercise treadmill      stress test.   The patient was discharged home in stable medical condition.      Drue Dun, M.D.  Electronically Signed      Wayne A. Sheffield Slider, M.D.  Electronically Signed    EE/MEDQ  D:  01/02/2008  T:  01/03/2008  Job:  [id number]

## 2011-06-07 ENCOUNTER — Inpatient Hospital Stay (INDEPENDENT_AMBULATORY_CARE_PROVIDER_SITE_OTHER)
Admission: RE | Admit: 2011-06-07 | Discharge: 2011-06-07 | Disposition: A | Discharge status: Home / Self Care | Payer: Self-pay | Source: Ambulatory Visit | Attending: Family Medicine | Admitting: Family Medicine

## 2011-06-07 DIAGNOSIS — R6889 Other general symptoms and signs: Secondary | ICD-10-CM

## 2011-06-07 DIAGNOSIS — I10 Essential (primary) hypertension: Secondary | ICD-10-CM

## 2011-06-07 DIAGNOSIS — Z760 Encounter for issue of repeat prescription: Secondary | ICD-10-CM

## 2011-06-07 DIAGNOSIS — M79609 Pain in unspecified limb: Secondary | ICD-10-CM

## 2011-07-01 LAB — CBC
HCT: 35 — ABNORMAL LOW
HCT: 37.8
Hemoglobin: 11.9 — ABNORMAL LOW
Hemoglobin: 12.9
MCHC: 34
MCHC: 34.1
MCV: 92.1
MCV: 92.1
Platelets: 205
Platelets: 209
RBC: 3.81 — ABNORMAL LOW
RBC: 4.1
RDW: 12.4
RDW: 12.4
WBC: 5.5
WBC: 5.7

## 2011-07-01 LAB — BASIC METABOLIC PANEL
BUN: 15
CO2: 29
Calcium: 8.7
Chloride: 97
Creatinine, Ser: 0.87
GFR calc Af Amer: >60
GFR calc non Af Amer: >60
Glucose, Bld: 108 — ABNORMAL HIGH
Potassium: 3.2 — ABNORMAL LOW
Sodium: 133 — ABNORMAL LOW

## 2011-07-01 LAB — DIFFERENTIAL
Basophils Absolute: 0.1
Basophils Absolute: 0.1
Basophils Relative: 1
Basophils Relative: 1
Eosinophils Absolute: 0.3
Eosinophils Absolute: 0.4
Eosinophils Relative: 6 — ABNORMAL HIGH
Eosinophils Relative: 6 — ABNORMAL HIGH
Lymphocytes Relative: 51 — ABNORMAL HIGH
Lymphocytes Relative: 53 — ABNORMAL HIGH
Lymphs Abs: 2.8
Lymphs Abs: 3
Monocytes Absolute: 0.3
Monocytes Absolute: 0.5
Monocytes Relative: 5
Monocytes Relative: 9
Neutro Abs: 1.8
Neutro Abs: 2.1
Neutrophils Relative %: 31 — ABNORMAL LOW
Neutrophils Relative %: 38 — ABNORMAL LOW

## 2011-07-01 LAB — LIPID PANEL
Cholesterol: 213 — ABNORMAL HIGH
HDL: 33 — ABNORMAL LOW
LDL Cholesterol: 157 — ABNORMAL HIGH
Total CHOL/HDL Ratio: 6.5
Triglycerides: 113
VLDL: 23

## 2011-07-01 LAB — COMPREHENSIVE METABOLIC PANEL
ALT: 18
AST: 24
Albumin: 4
Alkaline Phosphatase: 59
BUN: 13
CO2: 31
Calcium: 9.8
Chloride: 102
Creatinine, Ser: 0.82
GFR calc Af Amer: >60
GFR calc non Af Amer: >60
Glucose, Bld: 106 — ABNORMAL HIGH
Potassium: 3.6
Sodium: 140
Total Bilirubin: 0.7
Total Protein: 7.9

## 2011-07-01 LAB — CARDIAC PANEL(CRET KIN+CKTOT+MB+TROPI)
CK, MB: 3.3
CK, MB: 3.6
CK, MB: 4.5 — ABNORMAL HIGH
Relative Index: 1.5
Relative Index: 1.6
Relative Index: 2
Total CK: 167
Total CK: 233 — ABNORMAL HIGH
Total CK: 283 — ABNORMAL HIGH
Troponin I: 0.01
Troponin I: 0.01
Troponin I: <0.01

## 2011-07-01 LAB — HEPARIN LEVEL (UNFRACTIONATED)
Heparin Unfractionated: 0.68
Heparin Unfractionated: 0.84 — ABNORMAL HIGH

## 2011-07-09 ENCOUNTER — Ambulatory Visit: Payer: Self-pay | Admitting: Family Medicine

## 2011-07-25 ENCOUNTER — Emergency Department (HOSPITAL_COMMUNITY): Payer: Medicaid Other

## 2011-07-25 ENCOUNTER — Emergency Department (HOSPITAL_COMMUNITY)
Admission: EM | Admit: 2011-07-25 | Discharge: 2011-07-25 | Disposition: A | Discharge status: Home / Self Care | Payer: Medicaid Other | Attending: Emergency Medicine | Admitting: Emergency Medicine

## 2011-07-25 DIAGNOSIS — Z79899 Other long term (current) drug therapy: Secondary | ICD-10-CM | POA: Insufficient documentation

## 2011-07-25 DIAGNOSIS — I10 Essential (primary) hypertension: Secondary | ICD-10-CM | POA: Insufficient documentation

## 2011-07-25 DIAGNOSIS — S93609A Unspecified sprain of unspecified foot, initial encounter: Secondary | ICD-10-CM | POA: Insufficient documentation

## 2011-07-25 DIAGNOSIS — M79609 Pain in unspecified limb: Secondary | ICD-10-CM | POA: Insufficient documentation

## 2011-07-25 DIAGNOSIS — Z8612 Personal history of poliomyelitis: Secondary | ICD-10-CM | POA: Insufficient documentation

## 2011-07-25 DIAGNOSIS — X58XXXA Exposure to other specified factors, initial encounter: Secondary | ICD-10-CM | POA: Insufficient documentation

## 2011-08-01 ENCOUNTER — Ambulatory Visit (INDEPENDENT_AMBULATORY_CARE_PROVIDER_SITE_OTHER): Payer: Self-pay | Admitting: Family Medicine

## 2011-08-01 DIAGNOSIS — I10 Essential (primary) hypertension: Secondary | ICD-10-CM

## 2011-08-01 DIAGNOSIS — M79672 Pain in left foot: Secondary | ICD-10-CM

## 2011-08-01 DIAGNOSIS — M21969 Unspecified acquired deformity of unspecified lower leg: Secondary | ICD-10-CM

## 2011-08-01 DIAGNOSIS — M79609 Pain in unspecified limb: Secondary | ICD-10-CM

## 2011-08-01 HISTORY — DX: Pain in left foot: M79.672

## 2011-08-01 MED ORDER — DICLOFENAC SODIUM 75 MG PO TBEC: 75.0000 mg | DELAYED_RELEASE_TABLET | Freq: Two times a day (BID) | ORAL | Status: DC

## 2011-08-01 MED ORDER — LOVASTATIN 20 MG PO TABS: 20.0000 mg | ORAL_TABLET | Freq: Every day | ORAL | Status: DC

## 2011-08-01 MED ORDER — METOPROLOL TARTRATE 25 MG PO TABS
25.0000 mg | ORAL_TABLET | Freq: Two times a day (BID) | ORAL | Status: DC
Start: 2011-08-01 — End: 2013-01-20

## 2011-08-01 MED ORDER — TRAMADOL HCL 50 MG PO TABS: 50.0000 mg | ORAL_TABLET | Freq: Four times a day (QID) | ORAL | Status: DC | PRN

## 2011-08-01 MED ORDER — LISINOPRIL-HYDROCHLOROTHIAZIDE 20-12.5 MG PO TABS: 2.0000 | ORAL_TABLET | Freq: Every day | ORAL | Status: DC

## 2011-08-01 MED ORDER — SPIRONOLACTONE 25 MG PO TABS: 25.0000 mg | ORAL_TABLET | Freq: Every day | ORAL | Status: DC

## 2011-08-01 NOTE — Assessment & Plan Note (Signed)
Difficult exam today due to severe pain and because I have never seen her foot before- I do not know her baseline.    I am concerned for a stress fracture vs. Tendon strain.  Will have her seen in [facility] for evaluation.   Asked her to schedule NSAIDS and Tramadol for pain. Advised icing foot 2x/day.   Placed in post-op shoe, advised touch down weight bearing as tolerated.

## 2011-08-01 NOTE — Assessment & Plan Note (Signed)
Poorly controlled, but patient in severe pain at this time.  She is also unsure if she took all her bp meds this am.  Will refill medications today, stressed importance of taking all of them.  Pt to follow up in about a month when foot feeling better to re-check bp.

## 2011-08-01 NOTE — Progress Notes (Signed)
  Subjective:    Patient ID: Sarah [name], female    DOB: 1950/12/26, 60 y.o.   MRN: [id number]  HPI  Sarah [name] presents to clinic for followup of her left foot pain. She has a deformity from having polio in that foot. However she says that about a week ago when she was walking it started hurting severely. She went to the emergency room, where they did x-rays that were normal. She was prescribed tramadol, which she is almost out of. She also has diclofenac at home, but she thought a day EEG doctor told her not to take diclofenac and tramadol together. She says she feels like her foot is swollen, but she has not iced it. She thinks it is getting worse and not better. She is walking with crutches. She says that she did not have an injury twist her ankle are rolled her foot, it just started hurting she was walking.  The patient states that she's used to have a special shoe for that foot, but it's been using years since she had one.  Patient says she is not sure if she took all her blood pressure medications today.  She does need refills.  She denies chest pain, dizziness, palpitations, vision changes.   Review of Systems  Constitutional: Negative for fever.  HENT: Negative for rhinorrhea.   Respiratory: Negative for shortness of breath.   Cardiovascular: Negative for chest pain.  Musculoskeletal: Positive for arthralgias and gait problem.  Skin: Negative for rash and wound.       Objective:   Physical Exam  BP 191/78  Pulse 96  Temp(Src) 97.9 F (36.6 C) (Oral)  Ht 5\' 3"  (1.6 m)  Wt 195 lb (88.451 kg)  BMI 34.54 kg/m2 General appearance: alert and cooperative Eyes: EOMIT.  MSK: Right foot with some decreased transverse and long arch, otherwise no abnormalities Left foot: deformity likely fused midfoot with internal rotation and toes with varus deformity.  High transverse arch, callous on dorsal lateral side of foot indicating weight bearing.  Patient tender over most of foot, with some  swelling over lateral metatarsal bones.  She has maximum tenderness over 4th and 5th metatarsal bones.   No ankle tenderness to palpation, joint laxity, or swelling.       Assessment & Plan:

## 2011-08-01 NOTE — Assessment & Plan Note (Signed)
Chronic problem but likely causing poor biomechanics placing patient at high risk for injury.  She likely needs some custom shoes or orthotics to assist with her gait and support her foot.  Will refer to [facility] for evaluation for orthotics vs. Having custom shoes made.

## 2011-08-01 NOTE — Patient Instructions (Signed)
I'm sorry you are hurting so badly.   I want you to take th diclofenac twice a day every day for a week, and then use it as needed for pain.  Also, I want you to take the tramadol every 6 to 8 hours every day for a week, then use it as needed for pain.    I want you to ice your foot every day twice daily for 20 minutes for ne next 3-4 days, then ice it if it seems swollen.   Please wear this shoe, and use your crutches until you are seen in Sports medicine clinic.  Please ask the front desk to schedule you in sports medicine clinic on your way out.

## 2011-08-05 ENCOUNTER — Ambulatory Visit: Payer: Self-pay | Admitting: Family Medicine

## 2011-08-14 ENCOUNTER — Ambulatory Visit (INDEPENDENT_AMBULATORY_CARE_PROVIDER_SITE_OTHER): Payer: Self-pay | Admitting: Family Medicine

## 2011-08-14 VITALS — BP 185/101 | HR 75 | Ht 63.0 in | Wt 195.0 lb

## 2011-08-14 DIAGNOSIS — M21969 Unspecified acquired deformity of unspecified lower leg: Secondary | ICD-10-CM

## 2011-08-15 NOTE — Assessment & Plan Note (Addendum)
Her pain is likely secondary to an abnormal gait due to her deformity.  The ultimate solution to her pain lies in a custom shoe which will help neutralize her gait as much as possible and decrease the pressure from the lateral foot.  Acutely, the foot needs to be put to rest.  She will wear a cam walker to accomplish this.  She will continue with the crutches until she is able to walk comfortable with just the cam walker.  She is working on getting the orange card to help with her medical expenses.  Once she gets it we will plan to pursue the custom shoe wear.  She has tramadol and diclofenac for pain.

## 2011-08-15 NOTE — Progress Notes (Signed)
  Subjective:    Patient ID: Sarah [name], female    DOB: April 04, 1951, 60 y.o.   MRN: [id number]  HPI 60 y/o female is here c/o right foot pain.  She has a history of polio as a child with resulted in a foot deformity.  She wore custom shoes until age 38 and then started wearing regular shoes.  She has always had some level of foot pain but the pain increased over the past 4 months.  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  Last week she had an intense flare of the pain and went to the ED where she was given a post op shoe and crutches.  She was also started on ultram and diclofenac.  Her pain has not improved   Review of Systems     Objective:   Physical Exam  Left foot: cresent shaped similar to a club foot There is a surgical incision laterally Extremely tender to palpation along the lateral border Mildly tender to palpation over the medial surface No tenderness to palpation on the middle metatarsals There is a small amount of swelling generally on the foot The ankle is non-tender to palpation but has a small amount of swelling She dorsi and plantarflexes at the ankle but not fully  Plain film of the foot shows no fracture         Assessment & Plan:

## 2011-09-04 ENCOUNTER — Ambulatory Visit: Payer: Self-pay | Admitting: Family Medicine

## 2011-09-18 ENCOUNTER — Ambulatory Visit (INDEPENDENT_AMBULATORY_CARE_PROVIDER_SITE_OTHER): Payer: Self-pay | Admitting: Family Medicine

## 2011-09-18 DIAGNOSIS — M21969 Unspecified acquired deformity of unspecified lower leg: Secondary | ICD-10-CM

## 2011-09-18 DIAGNOSIS — M79672 Pain in left foot: Secondary | ICD-10-CM

## 2011-09-18 DIAGNOSIS — M79609 Pain in unspecified limb: Secondary | ICD-10-CM

## 2011-09-18 MED ORDER — OXYCODONE-ACETAMINOPHEN 5-325 MG PO TABS: 1.0000 | ORAL_TABLET | Freq: Four times a day (QID) | ORAL | Status: DC | PRN

## 2011-09-18 MED ORDER — OXYCODONE-ACETAMINOPHEN 5-325 MG PO TABS: ORAL_TABLET | ORAL | Status: DC

## 2011-09-18 NOTE — Patient Instructions (Signed)
1. Take your percocet at night for pain.  This medication may make you drowsy so be careful.  2. Follow with at the end of January or beginning of February.

## 2011-10-06 NOTE — Assessment & Plan Note (Signed)
See note under left foot pain.

## 2011-10-06 NOTE — Assessment & Plan Note (Signed)
Her continued pain is secondary to her deformity.  The solution to this pain lies in a custom shoe.  At this time she is unable to get a custom shoe due to financial constraints.  She says that she has been approved for disability and should have coverage by the end of January.  She will follow up at this time and we will refer her to a specialist for evaluation and custom shoes.  In the meantime we have added Percocet for pain control.  Also, we have added a heel wedge to decrease the pressure on the lateral aspect of the foot.

## 2011-10-06 NOTE — Progress Notes (Signed)
  Subjective:    Patient ID: Sarah [name], female    DOB: 12-Jan-1951, 60 y.o.   MRN: [id number]  HPI 60 y/o female with an acquired foot deformity secondary to polio is here to follow up for her left foot pain.  The pain has improved but not resolved with wearing the boot.  She continues to have pain on the lateral aspect of the foot.  She feels like the boot is rubbing on the lateral aspect of the foot.   Review of Systems     Objective:   Physical Exam  Left foot:  cresent shaped similar to a club foot  There is a surgical incision laterally  Extremely tender to palpation along the lateral border   No tenderness to palpation on the middle or lateral  metatarsals  Swelling from previous exam is resolved   Left Ankle: ROM is unchanged from previous exam The swelling noted on the previous exam is resolved         Assessment & Plan:

## 2011-10-23 ENCOUNTER — Encounter: Payer: Self-pay | Admitting: Family Medicine

## 2011-10-23 ENCOUNTER — Ambulatory Visit (INDEPENDENT_AMBULATORY_CARE_PROVIDER_SITE_OTHER): Payer: Medicaid Other | Admitting: Family Medicine

## 2011-10-23 DIAGNOSIS — I10 Essential (primary) hypertension: Secondary | ICD-10-CM

## 2011-10-23 DIAGNOSIS — M1990 Unspecified osteoarthritis, unspecified site: Secondary | ICD-10-CM | POA: Insufficient documentation

## 2011-10-23 DIAGNOSIS — M25539 Pain in unspecified wrist: Secondary | ICD-10-CM

## 2011-10-23 DIAGNOSIS — M79672 Pain in left foot: Secondary | ICD-10-CM

## 2011-10-23 DIAGNOSIS — M79609 Pain in unspecified limb: Secondary | ICD-10-CM

## 2011-10-23 DIAGNOSIS — M25532 Pain in left wrist: Secondary | ICD-10-CM

## 2011-10-23 NOTE — Assessment & Plan Note (Signed)
Overuse from using crutches due to her left foot pain.  Discussed analgesics, limiting use and ice.  Doubt need for xray given no trauma but may need if worsens or persists

## 2011-10-23 NOTE — Assessment & Plan Note (Signed)
I think this is overuse type injury with change in her usual walking pattern with the cam walker and crutches.  No signs of infection or fracture.  Try to stay off as much as possible and take analgesics as prescribed.  Follow up with SM.

## 2011-10-23 NOTE — Assessment & Plan Note (Signed)
Not well controlled today has run out of some of her medications Suggested calling for Rx and getting filled asap she agrees

## 2011-10-23 NOTE — Patient Instructions (Addendum)
I think your foot pain is due to walking on the twisted side of your foot.  I would mainly use crutches and not bear much weight on that foot until you see sports medicine  Your thumb wrist pain is arthritis and overuse from using the crutches - limit the amount you walk with the crutches  For your foot pain Take the diclofenac and the tramadol regularly during the day.  This should also help your left thumb Take the percocet at night  Ask [facility] to send [place] a request for your refills - you need to do this for your blood pressure  See Dr Lula Olszewski in 2 weeks

## 2011-10-23 NOTE — Progress Notes (Signed)
  Subjective:    Patient ID: Sarah [name], female    DOB: Sep 17, 1951, 61 y.o.   MRN: [id number]  HPI  Left Foot Pain Having more pain in left foot since seen at sports medicine.   Thinks due to walking on it more with the cam walker.  Did not take her diclofenac or tramadol today.  Does take percocet at night which helps.  No fever or discharge from calluses or pain elsewhere except left wrist.     Left Wrist Pain Started a few days ago.  Thinks due to walking using her crutches.  Pain is at base of thumb with mild soft tissue swelling No redness or loss of sttrength or sudden trauma.  HYPERTENSION Has not taken her her spironolactone nor metoprolol since ran out.  No chest pain or shortness of breath   Review of Symptoms - see HPI  PMH - Smoking status noted.   Reviewed notes from sports Medicine   Review of Systems     Objective:   Physical Exam Left foot - chronic severe deformity with calluses over lateral edge.  Quite tender to light touch.  No fluctuance or discharge from calluses or increased warmth or redness.   albe to move ankle joint without pain.  Minimal toe movement due to deformity  Left wrist Tender focally over base of thumb.  With mild soft tissue swelling.  Neg finkelsteins sensation intact.  No pain with pronation supiantion  Right upper and lower extrem joints good range of motion without pain       Assessment & Plan:

## 2011-11-06 ENCOUNTER — Ambulatory Visit (INDEPENDENT_AMBULATORY_CARE_PROVIDER_SITE_OTHER): Payer: Medicaid Other | Admitting: Family Medicine

## 2011-11-06 ENCOUNTER — Encounter: Payer: Self-pay | Admitting: Family Medicine

## 2011-11-06 ENCOUNTER — Ambulatory Visit: Payer: Medicaid Other | Admitting: Family Medicine

## 2011-11-06 VITALS — BP 164/101 | HR 103 | Ht 67.0 in | Wt 185.0 lb

## 2011-11-06 DIAGNOSIS — M21969 Unspecified acquired deformity of unspecified lower leg: Secondary | ICD-10-CM

## 2011-11-06 DIAGNOSIS — M25532 Pain in left wrist: Secondary | ICD-10-CM

## 2011-11-06 DIAGNOSIS — M25539 Pain in unspecified wrist: Secondary | ICD-10-CM

## 2011-11-06 NOTE — Patient Instructions (Signed)
Appointment with [facility] [phone]) on Feb. 13th at 2:45pm. Patient given appointment card.

## 2011-11-06 NOTE — Assessment & Plan Note (Signed)
The pain is secondary to this deformity and her inability to wear normal foot wear.  The cam walker provided some relief but she needs a more definitive treatment.  We have consulted podiatry for definitive care of this foot.  She will likely need specialized footwear vs. Surgical correction of the deformity.

## 2011-11-06 NOTE — Assessment & Plan Note (Signed)
Agree that this is an overuse syndrome due to the crutches.  She will continue with only one crutch using the left hand.  She will do ROM HEP several times a day for treatment on the left and prevention on the right.  She has pain meds that she can use PRN.

## 2011-11-06 NOTE — Progress Notes (Signed)
  Subjective:    Patient ID: Sarah [name], female    DOB: [birth year], 61 y.o.   MRN: [id number]  HPI 61 y/o female is here for follow up for left foot pain secondary to an acquired deformity.  She had some relief with the heel wedge placed in her cam walker at the previous visit but she still has pain.  She has since developed left hand pain also due to prolonged use of crutches.  She now has medicaid.   Review of Systems     Objective:   Physical Exam  Left foot:  cresent shaped similar to a club foot  There is a surgical incision laterally  Extremely tender to palpation along the lateral border  No tenderness to palpation on the middle or lateral metatarsals  Swelling from previous exam is resolved   Left Ankle:  ROM is unchanged from previous exam  No swelling  Left Hand/Wrist Tender to palpation over thenar eminence, dorsal thumb, and wrist flexor surface to the forearm ROM is normal at the wrist, arm, and hand There is a mild amount of swelling in the hand No ecchymosis Negative finkelstein             Assessment & Plan:

## 2011-12-13 ENCOUNTER — Other Ambulatory Visit: Payer: Self-pay | Admitting: Family Medicine

## 2011-12-13 DIAGNOSIS — M79672 Pain in left foot: Secondary | ICD-10-CM

## 2011-12-13 DIAGNOSIS — M21969 Unspecified acquired deformity of unspecified lower leg: Secondary | ICD-10-CM

## 2011-12-13 MED ORDER — TRAMADOL HCL 50 MG PO TABS: 50.0000 mg | ORAL_TABLET | Freq: Four times a day (QID) | ORAL | Status: DC | PRN

## 2011-12-13 NOTE — Progress Notes (Signed)
This encounter was created in error - please disregard.

## 2012-03-04 ENCOUNTER — Ambulatory Visit (INDEPENDENT_AMBULATORY_CARE_PROVIDER_SITE_OTHER): Payer: Medicaid Other | Admitting: Family Medicine

## 2012-03-04 VITALS — BP 188/111

## 2012-03-04 DIAGNOSIS — M21969 Unspecified acquired deformity of unspecified lower leg: Secondary | ICD-10-CM

## 2012-03-05 NOTE — Assessment & Plan Note (Signed)
Since she is unable to get treatment locally she will need referral to an academic center.

## 2012-03-05 NOTE — Progress Notes (Signed)
  Subjective:    Patient ID: Sarah [name], female    DOB: 1950/10/31, 61 y.o.   MRN: [id number]  HPI 61 y/o female is here for follow up for chronic foot pain due to an acquired deformity secondary polio.  Please see previous notes details.  She has been evaluated by both podiatry and orthopedics locally, both had no definitive solutions for her.  She has tried to have a special shoe made but was told that medicaid would not cover shoes for non-diabetic patients.  She has been in a CAM walker for over 5 months now.     Review of Systems     Objective:   Physical Exam  Left foot:  cresent shaped similar to a club foot  There is a surgical incision laterally  Tenderness on the lateral border is much improved but not resolved  Medial tenderness to palpation is resolved No tenderness to palpation on the middle metatarsals  Swelling has mostly resolved The ankle is non-tender to palpation  She dorsi and plantarflexes at the ankle but not fully        Assessment & Plan:

## 2012-10-15 ENCOUNTER — Other Ambulatory Visit: Payer: Self-pay | Admitting: *Deleted

## 2012-10-15 ENCOUNTER — Other Ambulatory Visit: Payer: Self-pay | Admitting: Family Medicine

## 2012-10-15 DIAGNOSIS — I10 Essential (primary) hypertension: Secondary | ICD-10-CM

## 2012-10-15 MED ORDER — SPIRONOLACTONE 25 MG PO TABS: 25.0000 mg | ORAL_TABLET | Freq: Every day | ORAL | Status: DC

## 2012-10-15 MED ORDER — LISINOPRIL-HYDROCHLOROTHIAZIDE 20-12.5 MG PO TABS: 2.0000 | ORAL_TABLET | Freq: Every day | ORAL | Status: DC

## 2012-10-20 ENCOUNTER — Other Ambulatory Visit: Payer: Self-pay | Admitting: *Deleted

## 2012-10-20 DIAGNOSIS — M21969 Unspecified acquired deformity of unspecified lower leg: Secondary | ICD-10-CM

## 2012-10-20 DIAGNOSIS — M79672 Pain in left foot: Secondary | ICD-10-CM

## 2012-10-20 MED ORDER — DICLOFENAC SODIUM 75 MG PO TBEC: 75.0000 mg | DELAYED_RELEASE_TABLET | Freq: Two times a day (BID) | ORAL | Status: DC

## 2012-10-27 ENCOUNTER — Ambulatory Visit: Payer: Medicaid Other | Admitting: Family Medicine

## 2013-01-20 ENCOUNTER — Other Ambulatory Visit: Payer: Self-pay | Admitting: *Deleted

## 2013-01-20 DIAGNOSIS — I10 Essential (primary) hypertension: Secondary | ICD-10-CM

## 2013-01-20 MED ORDER — METOPROLOL TARTRATE 25 MG PO TABS: 25.0000 mg | ORAL_TABLET | Freq: Two times a day (BID) | ORAL | Status: DC

## 2013-01-25 ENCOUNTER — Encounter: Payer: Self-pay | Admitting: *Deleted

## 2013-06-03 ENCOUNTER — Other Ambulatory Visit: Payer: Self-pay

## 2013-06-03 DIAGNOSIS — Z1231 Encounter for screening mammogram for malignant neoplasm of breast: Secondary | ICD-10-CM

## 2013-06-24 ENCOUNTER — Ambulatory Visit: Payer: Medicaid Other

## 2013-07-19 ENCOUNTER — Ambulatory Visit
Admission: RE | Admit: 2013-07-19 | Discharge: 2013-07-19 | Disposition: A | Discharge status: Home / Self Care | Payer: Medicare Other | Source: Ambulatory Visit

## 2013-07-19 DIAGNOSIS — Z1231 Encounter for screening mammogram for malignant neoplasm of breast: Secondary | ICD-10-CM

## 2013-10-08 ENCOUNTER — Emergency Department (HOSPITAL_COMMUNITY)
Admission: EM | Admit: 2013-10-08 | Discharge: 2013-10-09 | Disposition: A | Discharge status: Home / Self Care | Payer: Medicaid Other | Attending: Emergency Medicine | Admitting: Emergency Medicine

## 2013-10-08 ENCOUNTER — Encounter (HOSPITAL_COMMUNITY): Payer: Self-pay | Admitting: Emergency Medicine

## 2013-10-08 DIAGNOSIS — I10 Essential (primary) hypertension: Secondary | ICD-10-CM | POA: Insufficient documentation

## 2013-10-08 DIAGNOSIS — Z7982 Long term (current) use of aspirin: Secondary | ICD-10-CM | POA: Insufficient documentation

## 2013-10-08 DIAGNOSIS — M109 Gout, unspecified: Secondary | ICD-10-CM

## 2013-10-08 DIAGNOSIS — Z79899 Other long term (current) drug therapy: Secondary | ICD-10-CM | POA: Insufficient documentation

## 2013-10-08 DIAGNOSIS — Z880 Allergy status to penicillin: Secondary | ICD-10-CM | POA: Insufficient documentation

## 2013-10-08 DIAGNOSIS — M129 Arthropathy, unspecified: Secondary | ICD-10-CM | POA: Insufficient documentation

## 2013-10-08 HISTORY — DX: Essential (primary) hypertension: I10

## 2013-10-08 HISTORY — DX: Unspecified osteoarthritis, unspecified site: M19.90

## 2013-10-08 NOTE — ED Notes (Signed)
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  No known injury.  Good cap refill .  Sl swollen

## 2013-10-09 ENCOUNTER — Emergency Department (HOSPITAL_COMMUNITY): Payer: Medicaid Other

## 2013-10-09 MED ORDER — COLCHICINE 0.6 MG PO TABS: ORAL_TABLET | ORAL | Status: DC

## 2013-10-09 MED ORDER — OXYCODONE-ACETAMINOPHEN 5-325 MG PO TABS: 1.0000 | ORAL_TABLET | Freq: Three times a day (TID) | ORAL | Status: DC | PRN

## 2013-10-09 NOTE — ED Provider Notes (Signed)
CSN: [id number]     Arrival date & time 10/08/13  2240 History   First MD Initiated Contact with Patient 10/08/13 2320     Chief Complaint  Patient presents with  . Hand Pain   (Consider location/radiation/quality/duration/timing/severity/associated sxs/prior Treatment) Patient is a 63 y.o. female presenting with hand pain. The history is provided by the patient. No language interpreter was used.  Hand Pain Associated symptoms include arthralgias (right small finger pain ). Pertinent negatives include no chest pain, chills, fever, myalgias, numbness or weakness.  Sarah [name] is a 63 y/o F with PMHx of HTN and arthritis presenting to the ED with right small finger pain that started abruptly at approximately 6:00-7:00PM today. Patient reported that she was babysitting her grandchildren when she noticed redness, swelling, and pain to her right small finger. Patient described the discomfort to be a constant throbbing localized to the DIP of the right small finger with radiation to the tip of the finger. Reported that she has used nothing for the pain. Stated that she does not recall a recent injury or fall to the finger. Denied numbness, tingling, loss of sensation, being bit, injury. Denied history DM and gout.    Past Medical History  Diagnosis Date  . Hypertension   . Arthritis    History reviewed. No pertinent past surgical history. No family history on file. History  Substance Use Topics  . Smoking status: Never Smoker   . Smokeless tobacco: Not on file  . Alcohol Use: No   OB History   Grav Para Term Preterm Abortions TAB SAB Ect Mult Living                 Review of Systems  Constitutional: Negative for fever and chills.  Respiratory: Negative for chest tightness and shortness of breath.   Cardiovascular: Negative for chest pain.  Musculoskeletal: Positive for arthralgias (right small finger pain ). Negative for myalgias.  Neurological: Negative for weakness and numbness.  All  other systems reviewed and are negative.    Allergies  Penicillins  Home Medications   Current Outpatient Rx  Name  Route  Sig  Dispense  Refill  . aspirin 81 MG EC tablet   Oral   Take 81 mg by mouth daily.           . Calcium Carbonate-Vitamin D (CALTRATE 600+D) 600-400 MG-UNIT per tablet   Oral   Take 1 tablet by mouth 2 (two) times daily.           Marland Kitchen lisinopril-hydrochlorothiazide (PRINZIDE,ZESTORETIC) 20-12.5 MG per tablet   Oral   Take 2 tablets by mouth daily.   60 tablet   11   . lovastatin (MEVACOR) 20 MG tablet   Oral   Take 20 mg by mouth at bedtime.         . metoprolol tartrate (LOPRESSOR) 25 MG tablet   Oral   Take 1 tablet (25 mg total) by mouth 2 (two) times daily.   60 tablet   11   . spironolactone (ALDACTONE) 25 MG tablet   Oral   Take 1 tablet (25 mg total) by mouth daily.   30 tablet   11   . traMADol (ULTRAM) 50 MG tablet   Oral   Take 50 mg by mouth every 6 (six) hours as needed for moderate pain.         Marland Kitchen colchicine 0.6 MG tablet      Please take 2 tablets by mouth (total 0.12 mg)  and then one hour later please take one tablet (total 0.6 mg)   3 tablet   0   . oxyCODONE-acetaminophen (PERCOCET/ROXICET) 5-325 MG per tablet   Oral   Take 1 tablet by mouth every 8 (eight) hours as needed for severe pain.   5 tablet   0    BP 160/100  Temp(Src) 98.1 F (36.7 C)  Resp 18  Ht 5\' 5"  (1.651 m)  Wt 217 lb (98.431 kg)  BMI 36.11 kg/m2  SpO2 98% Physical Exam  Nursing note and vitals reviewed. Constitutional: She is oriented to person, place, and time. She appears well-developed and well-nourished. No distress.  HENT:  Head: Normocephalic and atraumatic.  Neck: Normal range of motion. Neck supple.  Cardiovascular: Normal rate, regular rhythm and normal heart sounds.  Exam reveals no friction rub.   No murmur heard. Pulses:      Radial pulses are 2+ on the right side, and 2+ on the left side.  Pulmonary/Chest: Effort  normal and breath sounds normal. No respiratory distress. She has no wheezes. She has no rales.  Musculoskeletal: She exhibits tenderness.  Swelling, erythema, and warmth noted to the right small finger - DIP joint mainly. Pain upon superficial palpation to the DIP and tip of the right small finger. Decreased ROM to the right small finger secondary to pain with motion. Full ROM to the remaining digits of the right hand and right wrist.   Neurological: She is alert and oriented to person, place, and time. She exhibits normal muscle tone. Coordination normal.  Strength intact to MCP, PIP, and DIP joints of the right hand - decreased strength to the right small finger DIP secondary to pain.  Sensation intact with differentiation to sharp and dull touch to the right hand  Skin: Skin is warm and dry. She is not diaphoretic.  Please see Musculoskeletal  Psychiatric: She has a normal mood and affect. Her behavior is normal. Thought content normal.    ED Course  Procedures (including critical care time)  Dg Finger Little Right  10/09/2013   CLINICAL DATA:  Little finger pain and swelling without known injury.  EXAM: RIGHT LITTLE FINGER 2+V  COMPARISON:  None.  FINDINGS: There is an avulsion type fracture through the dorsal base of the 5th distal phalanx, without measurable distraction. The injury may be non acute based on the appearance of the fracture margins. Fifth DIP osteoarthritis with marginal osteophytes. No malalignment.  IMPRESSION: Age-indeterminate dorsal avulsion fracture of the 5th distal phalanx.   Electronically Signed   By: Jorje Guild M.D.   On: 10/09/2013 01:04   Labs Review Labs Reviewed - No data to display Imaging Review Dg Finger Little Right  10/09/2013   CLINICAL DATA:  Little finger pain and swelling without known injury.  EXAM: RIGHT LITTLE FINGER 2+V  COMPARISON:  None.  FINDINGS: There is an avulsion type fracture through the dorsal base of the 5th distal phalanx, without  measurable distraction. The injury may be non acute based on the appearance of the fracture margins. Fifth DIP osteoarthritis with marginal osteophytes. No malalignment.  IMPRESSION: Age-indeterminate dorsal avulsion fracture of the 5th distal phalanx.   Electronically Signed   By: Jorje Guild M.D.   On: 10/09/2013 01:04    EKG Interpretation   None       MDM   1. Gout     Filed Vitals:   10/08/13 2258  BP: 160/100  Temp: 98.1 F (36.7 C)  Resp: 18  Height: 5\' 5"  (1.651 m)  Weight: 217 lb (98.431 kg)  SpO2: 98%   Patient presenting to the ED with right small finger pain that started abruptly at approximately 6:00-7:00PM. Patient does not recall hitting her finger into anything, denied recent injuries or falls. Patient reported that there is a constant throbbing sensation to the right small finger, distal aspect. Denied numbness, tingling.  Alert and oriented,. GCS 15. Heart rate and rhythm normal. Lungs clear. Radial pulses 2+ bilaterally. Swelling, erythema, inflammation, and warmth noted to the DIP and tip of the right small finger. Pain upon superficial palpation to the right small finger. Sensation intact. Decreased ROM and strength to the DIP joint secondary to pain. Strength intact to the MCP and DIP joint of the right small finger - strength intact to the MCP, PIP, DIP of the remaining digits of the right hand. Negative neurological deficits noted.  Plain film of right small finger noted age-indeterminate avulsion fracture of the 5th distal phalanx.  Doubt septic joint - patient has not had previous injury, does not have DM. Suspicion high for gout. Patient placed in finger splint. Discharged patient. Discharged patient with colchicine. Referred patient to PCP. Discussed with patient to rest and stay hydrated. Discussed with patient to continue to monitor symptoms and if symptoms are to worsen or change to report back to the ED - strict return instructions given.  Patient  agreed to plan of care, understood, all questions answered.   Jamse Mead, PA-C 10/09/13 [phone]

## 2013-10-09 NOTE — Discharge Instructions (Signed)
Please call your doctor for a followup appointment within 24-48 hours. When you talk to your doctor please let them know that you were seen in the emergency department and have them acquire all of your records so that they can discuss the findings with you and formulate a treatment plan to fully care for your new and ongoing problems. Please call and set-up an appointment with primary care provider and orthopedics Please rest and stay hydrated While on pain medications there is to be no drinking alcohol, driving, operating any heavy machinery - if there is extra please dispose in a proper manner. Please do not take any extra Tylenol with this medication for this can lead to Tylenol overdose and liver issues.  Please take medications as prescribed Please continue to monitor symptoms and if symptoms are to worsen or change (fever greater than 101, chills, nausea, vomiting, chest pain, shortness of breath, difficulty breathing, swelling to the finger, numbness, tingling, loss of sensation, fall, injury, red streaks running down the finger) please report back to the ED   Gout Gout is when your joints become red, sore, and swell (inflammed). This is caused by the buildup of uric acid crystals in the joints. Uric acid is a chemical that is normally in the blood. If the level of uric acid gets too high in the blood, these crystals form in your joints and tissues. Over time, these crystals can form into masses near the joints and tissues. These masses can destroy bone and cause the bone to look misshapen (deformed). HOME CARE   Do not take aspirin for pain.  Only take medicine as told by your doctor.  Rest the joint as much as you can. When in bed, keep sheets and blankets off painful areas.  Keep the sore joints raised (elevated).  Put warm or cold packs on painful joints. Use of warm or cold packs depends on which works best for you.  Use crutches if the painful joint is in your leg.  Drink enough  fluids to keep your pee (urine) clear or pale yellow. Limit alcohol, sugary drinks, and drinks with fructose in them.  Follow your diet instructions. Pay careful attention to how much protein you eat. Include fruits, vegetables, whole grains, and fat-free or low-fat milk products in your daily diet. Talk to your doctor or dietician about the use of coffee, vitamin C, and cherries. These may help lower uric acid levels.  Keep a healthy body weight. GET HELP RIGHT AWAY IF:   You have watery poop (diarrhea), throw up (vomit), or have any side effects from medicines.  You do not feel better in 24 hours, or you are getting worse.  Your joint becomes suddenly more tender, and you have chills or a fever. MAKE SURE YOU:   Understand these instructions.  Will watch your condition.  Will get help right away if you are not doing well or get worse. Document Released: 07/02/2008 Document Revised: 01/18/2013 Document Reviewed: 01/01/2010 [facility] Patient Information 2014 [place].  Purine Restricted Diet A low-purine diet consists of foods that reduce uric acid made in your body. INDICATIONS FOR USE  Your caregiver may ask you to follow a low-purine diet to reduce gout flairs.  GUIDELINES  Avoid high-purine foods, including all alcohol, yeast extracts taken as supplements, and sauces made from meats (like gravy). Do not eat high-purine meats, including anchovies, sardines, herring, mussels, tuna, codfish, scallops, trout, haddock, bacon, organ meats, tripe, goose, wild game, and sweetbreads.  Grains  Allowed/Recommended: All, except those listed to consume in moderation.  Consume in Moderation: Oatmeal ( cup uncooked daily), wheat bran or germ ( cup daily), and whole grains. Vegetables  Allowed/Recommended: All, except those listed to consume in moderation.  Consume in Moderation: Asparagus, cauliflower, spinach, mushrooms, and green peas ( cup daily). Fruit  Allowed/Recommended:  All.  Consume in Moderation: None. Meat and Meat Substitutes  Allowed/Recommended: Eggs, nuts, and peanut butter.  Consume in Moderation: Limit to 4 to 6 oz daily. Avoid high-purine meats. Lentils, peas, and dried beans (1 cup daily). Milk  Allowed/Recommended: All. Choose low-fat or skim when possible.  Consume in Moderation: None. Fats and Oils  Allowed/Recommended: All.  Consume in Moderation: None. Beverages  Allowed/Recommended: All, except those listed to avoid.  Avoid: All alcohol. Condiments/Miscellaneous  Allowed/Recommended: All, except those listed to consume in moderation.  Consume in Moderation: Bouillon and meat-based broths and soups. Document Released: 01/18/2011 Document Revised: 12/16/2011 Document Reviewed: 01/18/2011 [facility] Patient Information 2014 [place], [place].   Emergency Department Resource Guide 1) Find a Doctor and Pay Out of Pocket Although you won't have to find out who is covered by your insurance plan, it is a good idea to ask around and get recommendations. You will then need to call the office and see if the doctor you have chosen will accept you as a new patient and what types of options they offer for patients who are self-pay. Some doctors offer discounts or will set up payment plans for their patients who do not have insurance, but you will need to ask so you aren't surprised when you get to your appointment.  2) Contact Your Local Health Department Not all health departments have doctors that can see patients for sick visits, but many do, so it is worth a call to see if yours does. If you don't know where your local health department is, you can check in your phone book. The CDC also has a tool to help you locate your state's health department, and many state websites also have listings of all of their local health departments.  3) Find a Walk-in Clinic If your illness is not likely to be very severe or complicated, you may want to try  a walk in clinic. These are popping up all over the country in pharmacies, drugstores, and shopping centers. They're usually staffed by nurse practitioners or physician assistants that have been trained to treat common illnesses and complaints. They're usually fairly quick and inexpensive. However, if you have serious medical issues or chronic medical problems, these are probably not your best option.  No Primary Care Doctor: - Call Health Connect at  [phone] - they can help you locate a primary care doctor that  accepts your insurance, provides certain services, etc. - Physician Referral Service- [phone]  Chronic Pain Problems: Organization         Address  Phone   Notes  [facility]  [phone] Patients need to be referred by their primary care doctor.   Medication Assistance: Organization         Address  Phone   Notes  [facility] Medication [facility] [address] [place], [place] [address] [phone] --Must be a resident of [facility] -- Must have NO insurance coverage whatsoever (no Medicaid/ Medicare, etc.) -- The pt. MUST have a primary care doctor that directs their care regularly and follows them in the community   [facility]  [phone]  [employer]  [phone]    Agencies that provide inexpensive medical care: Organization         Address  Phone   Notes  [facility]  [phone]   [facility]    [phone]   [facility] [address] [place], [place] [address] [phone]   [facility] [address] [place] [place] [phone]   Planned Parenthood    [phone]   [facility]    [phone]   [facility]  [address], [place] Phone:  [phone], Fax:  [phone] Hours of Operation:  9 am - 6 pm, M-F.  Also accepts Medicaid/Medicare and self-pay.  [facility]  [address], [place] Phone: [phone], Fax: [phone]. Hours of Operation:  8:30 am - 5:30 pm, M-F.  Also accepts Medicaid and self-pay.  [facility] [place] [address], [place] Phone: [phone]   [facility] [address] Natasha Bence [place], [place] [phone], Ext. [phone] Mondays & Thursdays: 7-9 AM.  First 15 patients are seen on a first come, first serve basis.    Medicaid-accepting [facility] Providers:  Organization         Address  Phone   Notes  [facility] [address], [place] [phone] Also accepts self-pay patients.  [facility] [address] Laurell Josephs [place], [place]  [phone]   [facility] [address], [place] [phone]   [facility] [address] [place] [phone]   Renaye Rakers [address], [place]   [phone] Only accepts [place] Access [place] patients after they have their name applied to their card.   Self-Pay (no insurance) in [facility]:  Organization         Address  Phone   Notes  Sickle Cell Patients, [facility] Medicine [address] [place], [place] [phone]   [facility] [address] [place], [place] [phone]   [facility] [place]  [address], [place] [phone]   [facility]/Dr. Osei-Bonsu  [address], [place] or [address], [place] [phone] Phone number for both [place] and [place] locations is the same.  [facility] [address], [place] [phone]   [facility] [address] [place] or [address] [phone]   [facility] [address] [place] [phone], phone; [phone], fax  Sees patients 1st and 3rd Saturday of every month.  Must not qualify for public or private insurance (i.e. Medicaid, Medicare, [place] Health Choice, Veterans' Benefits)  Household income should be no more than 200% of the poverty level The clinic cannot treat you if you are pregnant or think you are pregnant  Sexually transmitted diseases are not treated at the clinic.    Dental Care: Organization         Address  Phone  Notes  [facility] Department of [facility] [address] [place], [place] [phone] Accepts children up to age 64 who are enrolled in [place] or [place] Health Choice; pregnant women with a Medicaid card;  and children who have applied for Medicaid or [place] Health Choice, but were declined, whose parents can pay a reduced fee at time of service.  [facility] [place]  [address], [place] [phone] Accepts children up to age 61 who are enrolled in [place] or [place] Health Choice; pregnant women with a Medicaid card; and children who have applied for Medicaid or [place] Health Choice, but were declined, whose parents can pay a reduced fee at time of service.  [facility]  [address], [place] [phone] Patients are seen by appointment only. Walk-ins are not accepted. [facility] will see patients 61 years of age and older. Monday - Tuesday (8am-5pm) Most Wednesdays (8:30-5pm) $30 per visit, cash only  [facility]  [address], [facility] [place] [phone] Patients are seen by appointment only. Walk-ins are not accepted. [facility] will see patients 61 years of age and older. One Wednesday Evening (Monthly: Volunteer Based).  $30 per visit, cash only  [employer] [facility] [facility]  [phone] for adults; Children under age 174, call Graduate Pediatric Dentistry at [phone]. Children aged 674-14, please call [phone] to  request a pediatric application.  Dental services are provided in all areas of dental care including fillings, crowns and bridges, complete and partial dentures, implants, gum treatment, root canals, and extractions. Preventive care is also provided. Treatment is provided to both adults and children. Patients are selected via a lottery and there is often a waiting list.   [facility] [address], [place]  [phone] [email or web address]   [facility] [address], [place], [place] [phone], Ext. [phone] Second and Fourth Thursday of each month, opens at 6:30 AM; Clinic ends at 9 AM.  Patients are seen on a first-come first-served basis, and a limited number are seen during each clinic.   [facility]  [address], [place], [place] [phone]   Eligibility Requirements You must have lived in [place], [place], or [place] counties for at least the last three months.   You cannot be eligible for state or federal sponsored [place] Cityhealthcare insurance, including CIGNAVeterans Administration, [place]icaid, or [employer]icare.   You generally cannot be eligible for healthcare insurance through your employer.    How to apply: Eligibility screenings are held every Tuesday and Wednesday afternoon from 1:00 pm until 4:00 pm. You do not need an appointment for the interview!  [facility] [address], [place], [place] [phone]   [facility]  [phone]   [facility]  [phone]   [facility]  [phone]    Behavioral Health Resources in the Community: Intensive Outpatient Programs Organization         Address  Phone  Notes  [facility] [address], [place], [place] [phone]   [facility] [address], [place], [place] [phone]   [facility] [address], [place], [place]  [phone]   [facility] [address],  [place], [place] [phone] or [phone]   Substance Abuse Resources Organization         Address  Phone  Notes  [facility]  [phone]   [facility]  [phone]   [facility]  [phone]   [facility]  [phone]  Residential & Outpatient Substance Abuse Program  [phone]   Psychological Services Organization         Address  Phone  Notes  [facility] [place]  [place]  [phone]   [place] [phone] [address], [place] or [phone]    Mobile Crisis Teams Organization         Address  Phone  Notes  [facility], Mobile Crisis Care Unit  [phone]   [facility]  [address] [place], [place]   [place] Levels [address] [place] [phone]    Self-Help/Support Groups Organization         Address  Phone             Notes  [place]. of [place] - variety of support groups  [place] Call for more information  Narcotics Anonymous (NA), [facility] [address], [employer] [place]  2 meetings at this location   Special educational needs teacher         Address  Phone  Notes  [facility]idential [facility] [place],    [place]  [phone]   [facility]  [address], [place] [address], [place], [place]   [place], [place] [phone] Admissions: 8am-3pm M-F  Incentives Substance [place] [address],    [place], [place] [phone]   [facility] [address] [place], [place], [place]   [facility] [address]  [place], [place]   [facility] - Intensive Outpatient [address], [place], [place]   [facility] ([place].,  [place], [place] [phone] or [phone]   [facility] (RTS) [address] [place], [employer] Accepts Medicaid  [facility] [address],  [place] [phone] Substance Abuse/Addiction Treatment   [facility] Organization         Address  Phone  Notes  [facility]  [phone]   Domenic Schwab, PhD [address] Arlis Porta [place], [place]   [phone] or [phone]   [place] Gu[address] [place], [place] [phone]   [facility] [address], [place], [place] [phone] Insurance/Medicaid/sponsorship through [facility] [address] [place]                                    [place], [place] [phone] [place] [address], [place] [phone]    Dr. Adele Schilder  [phone]   [facility]. 1) [address] [place] 2) [place] 3)  [address], [place] [phone]  [phone]   [place] [phone] or [phone] (After Hours)

## 2013-10-10 NOTE — ED Provider Notes (Signed)
  I personally performed the services described in this documentation, which was scribed in my presence. The recorded information has been reviewed and is accurate.     Carmin Muskrat, MD 10/10/13 0003

## 2013-11-01 ENCOUNTER — Other Ambulatory Visit: Payer: Self-pay | Admitting: *Deleted

## 2013-11-01 NOTE — Telephone Encounter (Signed)
A user error has taken place: wrong chart...Sarah [name]

## 2013-11-26 ENCOUNTER — Other Ambulatory Visit: Payer: Self-pay | Admitting: Family Medicine

## 2013-12-30 ENCOUNTER — Other Ambulatory Visit: Payer: Self-pay | Admitting: Family Medicine

## 2014-02-16 ENCOUNTER — Ambulatory Visit (INDEPENDENT_AMBULATORY_CARE_PROVIDER_SITE_OTHER): Payer: Medicare Other | Admitting: Family Medicine

## 2014-02-16 ENCOUNTER — Encounter: Payer: Self-pay | Admitting: Family Medicine

## 2014-02-16 VITALS — BP 167/92 | HR 63 | Temp 98.3°F

## 2014-02-16 DIAGNOSIS — M79672 Pain in left foot: Secondary | ICD-10-CM

## 2014-02-16 DIAGNOSIS — M79609 Pain in unspecified limb: Secondary | ICD-10-CM

## 2014-02-16 DIAGNOSIS — I10 Essential (primary) hypertension: Secondary | ICD-10-CM

## 2014-02-16 LAB — BASIC METABOLIC PANEL
BUN: 16 mg/dL (ref 6–23)
CO2: 25 mEq/L (ref 19–32)
Calcium: 9.2 mg/dL (ref 8.4–10.5)
Chloride: 102 mEq/L (ref 96–112)
Creat: 0.82 mg/dL (ref 0.50–1.10)
Glucose, Bld: 114 mg/dL — ABNORMAL HIGH (ref 70–99)
Potassium: 3.8 mEq/L (ref 3.5–5.3)
Sodium: 136 mEq/L (ref 135–145)

## 2014-02-16 MED ORDER — SPIRONOLACTONE 25 MG PO TABS: 25.0000 mg | ORAL_TABLET | Freq: Every day | ORAL | Status: DC

## 2014-02-16 MED ORDER — METOPROLOL TARTRATE 25 MG PO TABS: 25.0000 mg | ORAL_TABLET | Freq: Two times a day (BID) | ORAL | Status: DC

## 2014-02-16 MED ORDER — LISINOPRIL-HYDROCHLOROTHIAZIDE 20-12.5 MG PO TABS: 2.0000 | ORAL_TABLET | Freq: Every day | ORAL | Status: DC

## 2014-02-16 NOTE — Patient Instructions (Signed)
Good to see you.  For your BP,  - get three values and write them down. Call me with these. - Follow up with me in 2-3 weeks. - If you develop chest pain, difficulty breathing, or other concerns, seek sooner care.  For your foot, - Continue resting, icing/heating, and elevating. - Continue wearing the boot. - Take mobic daily as needed (with food) for severe pain and tramadol for very severe pain. - Wear a compression sock. I am writing a prescription you can take to [facility]. - If you develop open wound, fevers, chills, or severe swelling/redness, seek evaluation.  Hilton Sinclair, MD

## 2014-02-16 NOTE — Progress Notes (Signed)
Patient ID: Sarah [name], female   DOB: 07/13/1951, 63 y.o.   MRN: [id number] Subjective:   CC: BP f/u, foot pain  HPI:   BP f/u Patient takes spironolactone, lisinopril-HCTZ, and metoprolol daily. Denies chest pain, dyspnea, dizziness, or blurry vision. Does have mild leg swelling and occasional migraine headache. Does not check BP at home. Denies tobacco, drugs, or etoh use.  Foot pain Chronic, as pt had polio as a child and left foot. Wears boot on the foot chronically which helps her ambulate but keep weight off where pain is worst. Pain is worst lateral foot where most of the weight sits, as foot is contracted in inverted position. Previously saw Dr Sharol Given and Dr Wynetta Emery (Ortho and [facility]). They had recommended surgery but patient did not want to go this route. Was on short course of percocet in Jan due to pain but did not like drowsiness. Tylenol, aleve, and warmth at foot helps. She raises it which helps swelling decrease.  Tramadol prescribed by provider at [facility]. She takes it BID. Denies new trauma to the foot, but both feet swell. Denies erythema.  Does not feel like prior gout flares.   Review of Systems - Per HPI.   Meds reviewed: Not taking colchicine or voltaren anymore - removed from list.    Objective:  Physical Exam BP 167/92  Pulse 63  Temp(Src) 98.3 F (36.8 C) (Oral) GEN: NAD, pleasant CV: RRR, distant PULM: CTAB EXTR: Trace anterior left foot edema, nonpitting No erythema Lateral foot very tender (chronically) Dorsal and plantar flexion 4+/5 Able to slightly wiggle toes Callus bottom foot thick with no erythema or purulence     Assessment:     Sarah [name] is a 63 y.o. female with h/o polio and HTN here for f/u of left foot pain and HTN.    Plan:     HTN f/u Suboptimal control in clinic today. Does not check outside of clinic. - Check BP outside of clinic and call me with values to find out trend. - Refilled lisinopril-HCTZ, spironolactone,  and metoprolol. - BMET today>>WNL - F/u 2-3 weeks or sooner PRN.  Foot pain Chronic, due to anatomic changes after reported polio illness. No signs/symptoms of infection or gout. - F/u with SM or ortho to rediscuss options. Pt should not need another referral but is to call me if she does. - Raise, continue warmth and ice therapy, and wear compression sock (rx provided). Continue boot for further protection. - Meloxicam daily with food PRN. - Underside of foot very callused. Recommended vaseline bottom of foot with sock. - f/u with me. - Return precautions reviewed. - Will not rx narcotics while provider at [facility] is managing.  PCP confusion [facility] because on [employer] - Get ROS - Pt going to figure it out.  HLD and obesity - Needs f/u at future visit   # Health Maintenance: Not discussed.  Follow-up: Follow up in 2-3 weeks for f/u HTN.   Hilton Sinclair, MD [place]

## 2014-02-17 ENCOUNTER — Encounter (HOSPITAL_COMMUNITY): Payer: Self-pay | Admitting: Family Medicine

## 2014-02-19 NOTE — Assessment & Plan Note (Signed)
Chronic, due to anatomic changes after reported polio illness. No signs/symptoms of infection or gout. - F/u with [facility] or ortho to rediscuss options. Pt should not need another referral but is to call me if she does. - Raise, continue warmth and ice therapy, and wear compression sock (rx provided). Continue boot for further protection. - Meloxicam daily with food PRN. - Underside of foot very callused. Recommended vaseline bottom of foot with sock. - f/u with me. - Return precautions reviewed. - Will not rx narcotics while provider at [facility] is managing.

## 2014-02-19 NOTE — Assessment & Plan Note (Signed)
Suboptimal control in clinic today. Does not check outside of clinic. - Check BP outside of clinic and call me with values to find out trend. - Refilled lisinopril-HCTZ, spironolactone, and metoprolol. - BMET today>>WNL - F/u 2-3 weeks or sooner PRN.

## 2014-03-11 ENCOUNTER — Ambulatory Visit (INDEPENDENT_AMBULATORY_CARE_PROVIDER_SITE_OTHER): Payer: Medicare Other | Admitting: Family Medicine

## 2014-03-11 ENCOUNTER — Encounter: Payer: Self-pay | Admitting: Family Medicine

## 2014-03-11 VITALS — BP 161/83 | HR 63 | Temp 98.1°F | Ht 65.0 in | Wt 210.0 lb

## 2014-03-11 DIAGNOSIS — M79672 Pain in left foot: Secondary | ICD-10-CM

## 2014-03-11 DIAGNOSIS — I10 Essential (primary) hypertension: Secondary | ICD-10-CM

## 2014-03-11 DIAGNOSIS — M25539 Pain in unspecified wrist: Secondary | ICD-10-CM

## 2014-03-11 DIAGNOSIS — M25532 Pain in left wrist: Secondary | ICD-10-CM

## 2014-03-11 DIAGNOSIS — G8929 Other chronic pain: Secondary | ICD-10-CM

## 2014-03-11 DIAGNOSIS — M79645 Pain in left finger(s): Secondary | ICD-10-CM

## 2014-03-11 DIAGNOSIS — M79609 Pain in unspecified limb: Secondary | ICD-10-CM

## 2014-03-11 MED ORDER — SPIRONOLACTONE 25 MG PO TABS: 25.0000 mg | ORAL_TABLET | Freq: Every day | ORAL | Status: DC

## 2014-03-11 MED ORDER — LOVASTATIN 20 MG PO TABS: 20.0000 mg | ORAL_TABLET | Freq: Every day | ORAL | Status: DC

## 2014-03-11 MED ORDER — TRAMADOL HCL 50 MG PO TABS: 50.0000 mg | ORAL_TABLET | Freq: Two times a day (BID) | ORAL | Status: DC | PRN

## 2014-03-11 MED ORDER — LISINOPRIL-HYDROCHLOROTHIAZIDE 20-12.5 MG PO TABS: 2.0000 | ORAL_TABLET | Freq: Every day | ORAL | Status: DC

## 2014-03-11 MED ORDER — METOPROLOL TARTRATE 25 MG PO TABS: 25.0000 mg | ORAL_TABLET | Freq: Two times a day (BID) | ORAL | Status: DC

## 2014-03-11 MED ORDER — AMLODIPINE BESYLATE 5 MG PO TABS: 5.0000 mg | ORAL_TABLET | Freq: Every day | ORAL | Status: DC

## 2014-03-11 NOTE — Assessment & Plan Note (Addendum)
Most likely OA at carpo-metacarpal joint with osteophyte palpated on exam, due to chronic crutches use. Also consider gout.  - Xray left hand to capture carpo-metacarpal joint - Rx tramadol #30 with 1 refill. Needs UDS and pain contract at f/u. Use only for severe pain. - ROM exercises, ice, heat therapy.  - Continue splint. - Mobic daily x 1 week, then advil 2-400mg  twice daily. - If no improvement in 1 month, f/u with me. Consider tx for gout and hand surgeon referral at that time.

## 2014-03-11 NOTE — Assessment & Plan Note (Signed)
Chronic. Continues to hurt and she is using chronic narcotic. - PT referral. - [facility] referral for eval for orthotic with left ankle inversion deformity after polio.

## 2014-03-11 NOTE — Progress Notes (Signed)
Patient ID: Sarah [name], female   DOB: [birth year], 63 y.o.   MRN: [id number] Subjective:   CC: Left wrist pain, f/u HTN  HPI:   Left wrist pain Patient states base of left thumb has been painful for months and is no worse today. It has also been chronically swollen. She wears thumb splint during the daytime which helps keep things from touching/irritating thumb, though she does not feel it helps much. Tramadol helps but she has run out. She has switched PCP to [name] and so [facility] will no longer be rx'ing this. Warmth helps occasionally. No preceding injury. She has been evaluated by Sarah [name] who believes this is due to chronic crutches use due to foot abnormality. She has not had injections. Denies redness or heat at the joint.  Hypertension Patient reports taking metoprolol, spironolactone, and lisinopril-HCTZ as prescribed. She is about to run out of medications and states they did not get to her pharmacy when last prescribed. SBP values at home 160s. Does not report chest pain or dyspnea, dizziness or fainting.  Left foot pain Chronic complaint. Pt reports lateral foot hurts bad if anything touches it.   Review of Systems - Per HPI.   PMH: H/o polio affecting left foot    Objective:  Physical Exam BP 161/83  Pulse 63  Temp(Src) 98.1 F (36.7 C) (Oral)  Ht 5\' 5"  (1.651 m)  Wt 210 lb (95.255 kg)  BMI 34.95 kg/m2 GEN: NAD, pleasant HEENT: Atraumatic, normocephalic, neck supple, EOMI, sclera clear  CV: RRR, no murmurs, rubs, or gallops PULM: CTAB, normal effort SKIN: No rash or cyanosis; warm and well-perfused EXTR: No lower extremity edema or calf tenderness;  Left wrist: Tender at 1st carpo-metacarpal joint with palmar surface with bony prominence;  Moderate swelling. Left foot inverted, tender along lateral foot Grip and wrist flexion/extension/pronation/supination 5/5 strength. PSYCH: Mood and affect euthymic, normal rate and volume of speech NEURO: Awake, alert, no focal  deficits grossly, normal speech    Assessment:     Sarah [name] is a 64 y.o. female with h/o chronic left foot and left wrist pain here for eval of left wrist and f/u of BP.    Plan:     Left wrist pain Most likely OA at carpo-metacarpal joint with osteophyte palpated on exam, due to chronic crutches use. Also consider gout.  - Xray left hand to capture carpo-metacarpal joint - Rx tramadol #30 with 1 refill. Needs UDS and pain contract at f/u. Use only for severe pain. - ROM exercises, ice, heat therapy.  - Continue splint. - Mobic daily x 1 week, then advil 2-400mg  twice daily. - If no improvement in 1 month, f/u with me. Consider tx for gout and hand surgeon referral at that time.  Foot pain Chronic. Continues to hurt and she is using chronic narcotic. - PT referral. - Sarah [name] referral for eval for orthotic with left ankle inversion deformity after polio.  Hypertension Uncontrolled based on values at home and in clinic.  - Add norvasc 5mg  daily to current management. - At f/u, refer to pharm clinic. - Check 3 home values and call me.  - Stay active. - Represcribed all meds today.  # Health Maintenance: Not discussed but patient needs appt in future for this.  Follow-up: Follow up in 1 month for re-eval of wrist pain.   Sarah Sinclair, MD [place]

## 2014-03-11 NOTE — Assessment & Plan Note (Signed)
Uncontrolled based on values at home and in clinic.  - Add norvasc 5mg  daily to current management. - At f/u, refer to pharm clinic. - Check 3 home values and call me.  - Stay active. - Represcribed all meds today.

## 2014-03-11 NOTE — Patient Instructions (Addendum)
For your hand, this is likely from your arthritis. Use ice. Use mobic once daily for 1-2 weeks. Then use advil 2-400mg  twice daily. Follow up with me in 4 weeks. Use your splint. Use tramadol only for severe pain. We are getting a left thumb xray.  For your foot,  I am referring you to PT.  For your BP, Start taking norvasc daily as well. Check BP at home and call me with 2-3 values. Stay as active as possible. I represcribed all meds to [facility]. Call me today if tyhey have not gotten the prescription.  Best,  Hilton Sinclair, MD

## 2014-03-25 ENCOUNTER — Ambulatory Visit
Admission: RE | Admit: 2014-03-25 | Discharge: 2014-03-25 | Disposition: A | Discharge status: Home / Self Care | Payer: Medicare Other | Source: Ambulatory Visit | Attending: Family Medicine | Admitting: Family Medicine

## 2014-03-25 DIAGNOSIS — M79645 Pain in left finger(s): Secondary | ICD-10-CM

## 2014-03-28 ENCOUNTER — Encounter: Payer: Self-pay | Admitting: Family Medicine

## 2014-03-28 NOTE — Progress Notes (Unsigned)
Pt dropped off form to be filled out regarding medical evaluation from division of social services.

## 2014-03-28 NOTE — Progress Notes (Unsigned)
Placed in Dr. Dianah Field box. Fleeger, Salome Spotted

## 2014-04-05 ENCOUNTER — Ambulatory Visit (INDEPENDENT_AMBULATORY_CARE_PROVIDER_SITE_OTHER): Payer: Medicare Other | Admitting: Sports Medicine

## 2014-04-05 ENCOUNTER — Ambulatory Visit: Payer: Medicare Other | Attending: Family Medicine | Admitting: Physical Therapy

## 2014-04-05 ENCOUNTER — Encounter: Payer: Self-pay | Admitting: Sports Medicine

## 2014-04-05 ENCOUNTER — Telehealth: Payer: Self-pay | Admitting: Family Medicine

## 2014-04-05 VITALS — Ht 63.0 in | Wt 210.0 lb

## 2014-04-05 DIAGNOSIS — M25579 Pain in unspecified ankle and joints of unspecified foot: Secondary | ICD-10-CM | POA: Insufficient documentation

## 2014-04-05 DIAGNOSIS — M25572 Pain in left ankle and joints of left foot: Secondary | ICD-10-CM

## 2014-04-05 DIAGNOSIS — M25676 Stiffness of unspecified foot, not elsewhere classified: Secondary | ICD-10-CM | POA: Insufficient documentation

## 2014-04-05 DIAGNOSIS — IMO0001 Reserved for inherently not codable concepts without codable children: Secondary | ICD-10-CM | POA: Insufficient documentation

## 2014-04-05 DIAGNOSIS — M25673 Stiffness of unspecified ankle, not elsewhere classified: Secondary | ICD-10-CM | POA: Insufficient documentation

## 2014-04-05 DIAGNOSIS — M21969 Unspecified acquired deformity of unspecified lower leg: Secondary | ICD-10-CM

## 2014-04-05 NOTE — Telephone Encounter (Signed)
Please let her know I have completed it but there is a section I have to ask her questions about to answer. I am post-call today so will need to do this tomorrow or later this week.   Thanks.  Hilton Sinclair, MD

## 2014-04-05 NOTE — Progress Notes (Signed)
   Subjective:    Patient ID: Sarah [name], female    DOB: 1950-12-27, 63 y.o.   MRN: [id number]  HPI chief complaint: Left foot pain  Patient comes in today with chronic left ankle pain. She has been seen in our clinic before. Please see previous office notes from this clinic for details regarding previous history. In short, she has a chronic foot and ankle deformity as a result of polio. 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 She has been in the same [employer] for about 2 years. She comes to [place] as a referral from physical therapy with a suggestion of possibly trying an AFO. She has not had one previously. Majority of her pain is along the lateral aspect of her foot. Denies any recent trauma.  Interim medical history and current medications reviewed. Allergies are reviewed.    Review of Systems     Objective:   Physical Exam No acute distress.  Left foot: Moderate talipes equinovarus. Severely limited active and passive ankle dorsiflexion and plantar flexion. No subtalar motion. She is diffusely tender to palpation along the lateral. There is a majority of her weight along the lateral foot. No skin breakdown.       Assessment & Plan:  Chronic left foot and ankle deformity post polio  This is a difficult case. I am going to try her in an AFO and have her followup with me in 4 weeks. She will continue with physical therapy (Working with Anderson Malta at [facility]). If she cannot tolerate the AFO I may have to resort to placing her back into her Cam Walker. I will also get some updated xrays of both her foot and ankle. Of note, she has seen both podiatry and orthopedics in the past and neither were able to offer her much in the way of treatment.

## 2014-04-05 NOTE — Telephone Encounter (Signed)
Will forward to MD to see if this has been taken care of. [place] [facility]

## 2014-04-05 NOTE — Telephone Encounter (Signed)
Pt called about form for Social Services.  Wanted to know if it had been completed

## 2014-04-06 NOTE — Telephone Encounter (Signed)
I have completed form and placed in Tamika's box. She has an appt with me tomorrow, so if Elwin Sleight is okay with the form, she can put it back in my box once she has reviewed it.  Spoke with pt - she has not had any exposure to TB, no night sweats, fevers, cough, bloody sputum, or unexpected weight loss, and she has not lilved outside of the [place] for >1 month at any time. She does not work, is on disability, and is not around anyone with risk of TB that she is aware of.  Hilton Sinclair, MD PGY-2, [place]

## 2014-04-07 ENCOUNTER — Ambulatory Visit (HOSPITAL_COMMUNITY)
Admission: RE | Admit: 2014-04-07 | Discharge: 2014-04-07 | Disposition: A | Discharge status: Home / Self Care | Payer: Medicare Other | Source: Ambulatory Visit | Attending: Sports Medicine | Admitting: Sports Medicine

## 2014-04-07 ENCOUNTER — Ambulatory Visit (INDEPENDENT_AMBULATORY_CARE_PROVIDER_SITE_OTHER): Payer: Medicare Other | Admitting: Family Medicine

## 2014-04-07 ENCOUNTER — Encounter: Payer: Self-pay | Admitting: Family Medicine

## 2014-04-07 VITALS — BP 155/85 | HR 63 | Temp 98.6°F | Resp 18 | Wt 204.0 lb

## 2014-04-07 DIAGNOSIS — M79672 Pain in left foot: Secondary | ICD-10-CM

## 2014-04-07 DIAGNOSIS — M79609 Pain in unspecified limb: Secondary | ICD-10-CM

## 2014-04-07 DIAGNOSIS — I10 Essential (primary) hypertension: Secondary | ICD-10-CM

## 2014-04-07 DIAGNOSIS — M25569 Pain in unspecified knee: Secondary | ICD-10-CM | POA: Diagnosis not present

## 2014-04-07 DIAGNOSIS — M25539 Pain in unspecified wrist: Secondary | ICD-10-CM

## 2014-04-07 DIAGNOSIS — M25572 Pain in left ankle and joints of left foot: Secondary | ICD-10-CM

## 2014-04-07 DIAGNOSIS — M25532 Pain in left wrist: Secondary | ICD-10-CM

## 2014-04-07 NOTE — Assessment & Plan Note (Signed)
Chronic, related to h/o polio. Patient being evaluated by sports medicine -Followup ordered x-rays -Patient is waiting on new [place] continuing physical therapy

## 2014-04-07 NOTE — Patient Instructions (Signed)
Good to see you  Take your mobic or let me know if you already took it for wrist pain. Buy a compression sleeve just for the wrist, to compress it. If you have trouble finding a good one, let me know.   Check 3 BP values at home and call me with these in 2 weeks Check when you are NOT stressed. Work on adding exercises.  DASH Eating Plan DASH stands for "Dietary Approaches to Stop Hypertension." The DASH eating plan is a healthy eating plan that has been shown to reduce high blood pressure (hypertension). Additional health benefits may include reducing the risk of type 2 diabetes mellitus, heart disease, and stroke. The DASH eating plan may also help with weight loss. WHAT DO I NEED TO KNOW ABOUT THE DASH EATING PLAN? For the DASH eating plan, you will follow these general guidelines:  Choose foods with a percent daily value for sodium of less than 5% (as listed on the food label).  Use salt-free seasonings or herbs instead of table salt or sea salt.  Check with your health care provider or pharmacist before using salt substitutes.  Eat lower-sodium products, often labeled as "lower sodium" or "no salt added."  Eat fresh foods.  Eat more vegetables, fruits, and low-fat dairy products.  Choose whole grains. Look for the word "whole" as the first word in the ingredient list.  Choose fish and skinless chicken or [place] more often than red meat. Limit fish, poultry, and meat to 6 oz (170 g) each day.  Limit sweets, desserts, sugars, and sugary drinks.  Choose heart-healthy fats.  Limit cheese to 1 oz (28 g) per day.  Eat more home-cooked food and less restaurant, buffet, and fast food.  Limit fried foods.  Cook foods using methods other than frying.  Limit canned vegetables. If you do use them, rinse them well to decrease the sodium.  When eating at a restaurant, ask that your food be prepared with less salt, or no salt if possible. WHAT FOODS CAN I EAT? Seek help from a  dietitian for individual calorie needs. Grains Whole grain or whole wheat bread. Brown rice. Whole grain or whole wheat pasta. Quinoa, bulgur, and whole grain cereals. Low-sodium cereals. Corn or whole wheat flour tortillas. Whole grain cornbread. Whole grain crackers. Low-sodium crackers. Vegetables Fresh or frozen vegetables (raw, steamed, roasted, or grilled). Low-sodium or reduced-sodium tomato and vegetable juices. Low-sodium or reduced-sodium tomato sauce and paste. Low-sodium or reduced-sodium canned vegetables.  Fruits All fresh, canned (in natural juice), or frozen fruits. Meat and Other Protein Products Ground beef (85% or leaner), grass-fed beef, or beef trimmed of fat. Skinless chicken or [place]. Ground chicken or [place]. Pork trimmed of fat. All fish and seafood. Eggs. Dried beans, peas, or lentils. Unsalted nuts and seeds. Unsalted canned beans. Dairy Low-fat dairy products, such as skim or 1% milk, 2% or reduced-fat cheeses, low-fat ricotta or cottage cheese, or plain low-fat yogurt. Low-sodium or reduced-sodium cheeses. Fats and Oils Tub margarines without trans fats. Light or reduced-fat mayonnaise and salad dressings (reduced sodium). Avocado. Safflower, olive, or canola oils. Natural peanut or almond butter. Other Unsalted popcorn and pretzels. The items listed above may not be a complete list of recommended foods or beverages. Contact your dietitian for more options. WHAT FOODS ARE NOT RECOMMENDED? Grains White bread. White pasta. White rice. Refined cornbread. Bagels and croissants. Crackers that contain trans fat. Vegetables Creamed or fried vegetables. Vegetables in a cheese sauce. Regular canned vegetables. Regular canned  tomato sauce and paste. Regular tomato and vegetable juices. Fruits Dried fruits. Canned fruit in light or heavy syrup. Fruit juice. Meat and Other Protein Products Fatty cuts of meat. Ribs, chicken wings, bacon, sausage, bologna, salami,  chitterlings, fatback, hot dogs, bratwurst, and packaged luncheon meats. Salted nuts and seeds. Canned beans with salt. Dairy Whole or 2% milk, cream, half-and-half, and cream cheese. Whole-fat or sweetened yogurt. Full-fat cheeses or blue cheese. Nondairy creamers and whipped toppings. Processed cheese, cheese spreads, or cheese curds. Condiments Onion and garlic salt, seasoned salt, table salt, and sea salt. Canned and packaged gravies. Worcestershire sauce. Tartar sauce. Barbecue sauce. Teriyaki sauce. Soy sauce, including reduced sodium. Steak sauce. Fish sauce. Oyster sauce. Cocktail sauce. Horseradish. Ketchup and mustard. Meat flavorings and tenderizers. Bouillon cubes. Hot sauce. Tabasco sauce. Marinades. Taco seasonings. Relishes. Fats and Oils Butter, stick margarine, lard, shortening, ghee, and bacon fat. Coconut, palm kernel, or palm oils. Regular salad dressings. Other Pickles and olives. Salted popcorn and pretzels. The items listed above may not be a complete list of foods and beverages to avoid. Contact your dietitian for more information. WHERE CAN I FIND MORE INFORMATION? National Heart, Lung, and Blood Institute: travelstabloid.com Document Released: 09/12/2011 Document Revised: 09/28/2013 Document Reviewed: 07/28/2013 [employer] Patient Information 2015 [place], [place]. This information is not intended to replace advice given to you by your health care provider. Make sure you discuss any questions you have with your health care provider.  Best,  Hilton Sinclair, MD

## 2014-04-07 NOTE — Assessment & Plan Note (Signed)
155/85 in clinic today, which is the best it has been in the past few visits. However it continues to be elevated at home. -Patient check 3 more values at home and call in 2 weeks with these values -At the time we can decide about increasing amlodipine to 10 mg daily if needed -Check BMET on followup in one month -If blood pressure still increased at that time, will refer to pharmacy clinic -Patient has a hard time staying active duty her left foot deformity. Recommended pool exercises or seated exercises.

## 2014-04-07 NOTE — Assessment & Plan Note (Signed)
Exam and x-ray consistent with first and second [facility] joint degenerative changes of osteoarthritis. -Continue range of motion exercises, splint, and patient is to check on if she ever took meloxicam. -Pain contract and UDS today. -Recommended wrist compression sleeve. -If pain worsens, can consider referral to hand Center for paraffin wax therapy. In the meantime she can apply heat therapy to the hand at home.

## 2014-04-07 NOTE — Telephone Encounter (Signed)
Gave to patient.  Sarah Sinclair, MD

## 2014-04-07 NOTE — Telephone Encounter (Signed)
Form given back to Dr. Dianah Field, pt has an appt with provider.  Derl Barrow, RN

## 2014-04-07 NOTE — Progress Notes (Signed)
Patient ID: Sarah [name], female   DOB: 05-02-1951, 63 y.o.   MRN: [id number] Subjective:   CC: Followup wrist pain, uncontrolled hypertension, and foot pain  HPI:   Re-evaluation of wrist pain Thought at previous visit to be OA at [facility] joint with osteophyte palpated on exam. X-ray of left hand showed advanced degenerative change at first and second [facility] joint. Patient takes tramadol daily which helps. She was prescribed to [facility] one [facility] and follow that with Advil but she is unsure if she did this. She will go home and check. She denies any worsening. She reports mild first [facility] joint swelling that extends proximally to mid forearm.  DG and complete left:IMPRESSION:  Degenerative changes in the first and second carpometacarpal joints.  Uncontrolled HTN Added norvasc last visit to other meds. Patient reports having taken this regularly. She denies any chest pain, shortness of breath, leg swelling, dizziness, or syncope. She is also taking her lisinopril-hydrochlorothiazide and metoprolol as prescribed. Home blood pressure values were in the 884-166 systolic. She has not been doing much exercise due to left foot pain.  Left foot pain The patient was seen at sports medicine, with a recommended that she try a new shoe to decrease pressure. Pain is chronic secondary to ankle deformity with history of polio. New x-rays of left foot has been ordered.   Review of Systems - Per HPI.   Smoking status: Never smoker    Objective:  Physical Exam BP 155/85  Pulse 63  Temp(Src) 98.6 F (37 C) (Oral)  Resp 18  Wt 204 lb (92.534 kg)  SpO2 97% GEN: NAD, well-dressed and well-groomed HEENT: Atraumatic, normocephalic, neck supple, EOMI, sclera clear  CV: RRR, no murmurs, rubs, or gallops, 1+ bilateral radial pulses PULM: CTAB, normal effort ABD: Soft, nontender, nondistended, obese SKIN: No rash or cyanosis; warm and well-perfused EXTR: No lower extremity edema or calf tenderness  Left foot  in boot PSYCH: Mood and affect euthymic, normal rate and volume of speech NEURO: Awake, alert, no focal deficits grossly, normal speech    Assessment:     Sarah [name] is a 63 y.o. female with h/o polio, chronic left foot pain, and htn here for f/u of HTN, left foot pain, and left wrist pain.    Plan:     Reevaluation of LEFT wrist pain Exam and x-ray consistent with first and second [facility] joint degenerative changes of osteoarthritis. -Continue range of motion exercises, splint, and patient is to check on if she ever took meloxicam. -Pain contract and UDS today. -Recommended wrist compression sleeve. -If pain worsens, can consider referral to hand Center for paraffin wax therapy. In the meantime she can apply heat therapy to the hand at home.  Uncontrolled hypertension 155/85 in clinic today, which is the best it has been in the past few visits. However it continues to be elevated at home. -Patient check 3 more values at home and call in 2 weeks with these values -At the time we can decide about increasing amlodipine to 10 mg daily if needed -Check BMET on followup in one month -If blood pressure still increased at that time, will refer to pharmacy clinic -Patient has a hard time staying active duty her left foot deformity. Recommended pool exercises or seated exercises.  Left foot pain Chronic, related to h/o polio. Patient being evaluated by sports medicine -Followup ordered x-rays -Patient is waiting on new [place] continuing physical therapy   # Health Maintenance: Needs  f/u for this  Follow-up: Follow up in 1 month for f/u of HTN.   Hilton Sinclair, MD [place]

## 2014-04-08 LAB — DRUG SCR UR, PAIN MGMT, REFLEX CONF
Amphetamine Screen, Ur: NEGATIVE
Barbiturate Quant, Ur: NEGATIVE
Benzodiazepines.: NEGATIVE
Cocaine Metabolites: NEGATIVE
Creatinine,U: 191.12 mg/dL
Marijuana Metabolite: NEGATIVE
Methadone: NEGATIVE
Opiates: NEGATIVE
Phencyclidine (PCP): NEGATIVE
Propoxyphene: NEGATIVE

## 2014-04-11 ENCOUNTER — Ambulatory Visit: Payer: Medicare Other | Attending: Family Medicine

## 2014-04-11 DIAGNOSIS — M25579 Pain in unspecified ankle and joints of unspecified foot: Secondary | ICD-10-CM | POA: Diagnosis not present

## 2014-04-11 DIAGNOSIS — M25673 Stiffness of unspecified ankle, not elsewhere classified: Secondary | ICD-10-CM | POA: Insufficient documentation

## 2014-04-11 DIAGNOSIS — IMO0001 Reserved for inherently not codable concepts without codable children: Secondary | ICD-10-CM | POA: Insufficient documentation

## 2014-04-11 DIAGNOSIS — M25676 Stiffness of unspecified foot, not elsewhere classified: Secondary | ICD-10-CM | POA: Diagnosis not present

## 2014-04-13 ENCOUNTER — Ambulatory Visit: Payer: Medicare Other

## 2014-04-13 DIAGNOSIS — IMO0001 Reserved for inherently not codable concepts without codable children: Secondary | ICD-10-CM | POA: Diagnosis not present

## 2014-04-18 ENCOUNTER — Ambulatory Visit: Payer: Medicare Other | Admitting: Physical Therapy

## 2014-04-18 DIAGNOSIS — IMO0001 Reserved for inherently not codable concepts without codable children: Secondary | ICD-10-CM | POA: Diagnosis not present

## 2014-04-19 ENCOUNTER — Ambulatory Visit: Payer: Medicare Other

## 2014-04-19 DIAGNOSIS — IMO0001 Reserved for inherently not codable concepts without codable children: Secondary | ICD-10-CM | POA: Diagnosis not present

## 2014-04-26 ENCOUNTER — Ambulatory Visit: Payer: Medicare Other | Admitting: Physical Therapy

## 2014-04-26 DIAGNOSIS — IMO0001 Reserved for inherently not codable concepts without codable children: Secondary | ICD-10-CM | POA: Diagnosis not present

## 2014-04-28 ENCOUNTER — Ambulatory Visit: Payer: Medicare Other | Admitting: Physical Therapy

## 2014-05-02 ENCOUNTER — Ambulatory Visit: Payer: Medicare Other | Admitting: Physical Therapy

## 2014-05-02 DIAGNOSIS — IMO0001 Reserved for inherently not codable concepts without codable children: Secondary | ICD-10-CM | POA: Diagnosis not present

## 2014-05-03 ENCOUNTER — Ambulatory Visit (INDEPENDENT_AMBULATORY_CARE_PROVIDER_SITE_OTHER): Payer: Medicare Other | Admitting: Sports Medicine

## 2014-05-03 ENCOUNTER — Encounter: Payer: Self-pay | Admitting: Sports Medicine

## 2014-05-03 ENCOUNTER — Encounter: Payer: Self-pay | Admitting: Family Medicine

## 2014-05-03 VITALS — BP 175/103 | Ht 66.0 in | Wt 180.0 lb

## 2014-05-03 DIAGNOSIS — M79672 Pain in left foot: Secondary | ICD-10-CM

## 2014-05-03 DIAGNOSIS — M79609 Pain in unspecified limb: Secondary | ICD-10-CM

## 2014-05-03 NOTE — Assessment & Plan Note (Signed)
PT form for new cam walker filled out by attending Dr. Micheline Chapman.  This is a very difficult case given hx of polio.  She has seen Orthopedic specialist and no other interventions have been offered/tried.  As a result, patient will have to continue using cam walker to allow ambulation and to aid pain. Follow up as needed (periodically, if in need of new cam walker).

## 2014-05-03 NOTE — Progress Notes (Signed)
  VERLIE LIOTTA - 63 y.o. female MRN [id number]  Date of birth: October 28, 1950  SUBJECTIVE:     63 year old female with long standing history of left foot/ankle pain secondary to deformity from polio presents for follow up.  At previous visit on 04/05/14, AFO brace was given in hopes that patient could tolerate it in lieu of Cam walker.  Patient reports that she had no success with this.  She is still going to physical therapy and has to wear her cam walker to aid in pain and mobility.  She has no other complaints currently (other than chronic pain and weakness of the left foot and ankle).  She states that she is managing with the old cam walker.    ROS:     Per HPI  PERTINENT  PMH / PSH FH / / SH:  Past Medical, Surgical, Social History Reviewed.  Pertinent Historical Findings include: Hx of polio as a child which has caused foot drop and foot/ankle issues.  OBJECTIVE: BP 175/103  Ht 5\' 6"  (1.676 m)  Wt 180 lb (81.647 kg)  BMI 29.07 kg/m2  Physical Exam:  Vital signs are reviewed. General: well appearing female; NAD. Left foot/ankle:  - Moderate to severe talipes equinovarus.  Marked supination noted.  No open skin lesions or signs of underlying infection.  - Active and passive plantar flexion and dorsiflexion very limited on exam. - Very tender to palpation of the lateral foot. - Plantarflexion and dorsiflexion strength 0-1/5.  Xrays reviewed today:  Left ankle - Advanced degenerative changes noted.  Left foot - chronic deformities consistent with hx of polio.    ASSESSMENT & PLAN: See problem based charting.

## 2014-05-04 ENCOUNTER — Ambulatory Visit: Payer: Medicare Other | Admitting: Physical Therapy

## 2014-06-30 ENCOUNTER — Other Ambulatory Visit: Payer: Self-pay | Admitting: *Deleted

## 2014-06-30 ENCOUNTER — Other Ambulatory Visit (HOSPITAL_COMMUNITY)
Admission: RE | Admit: 2014-06-30 | Discharge: 2014-06-30 | Disposition: A | Discharge status: Home / Self Care | Payer: Medicare Other | Source: Ambulatory Visit | Attending: Family Medicine | Admitting: Family Medicine

## 2014-06-30 ENCOUNTER — Ambulatory Visit (INDEPENDENT_AMBULATORY_CARE_PROVIDER_SITE_OTHER): Payer: Medicare Other | Admitting: Family Medicine

## 2014-06-30 ENCOUNTER — Encounter: Payer: Self-pay | Admitting: Family Medicine

## 2014-06-30 VITALS — BP 150/92 | HR 72 | Temp 98.6°F | Ht 66.0 in | Wt 200.0 lb

## 2014-06-30 DIAGNOSIS — R2681 Unsteadiness on feet: Secondary | ICD-10-CM

## 2014-06-30 DIAGNOSIS — Z202 Contact with and (suspected) exposure to infections with a predominantly sexual mode of transmission: Secondary | ICD-10-CM

## 2014-06-30 DIAGNOSIS — Z1151 Encounter for screening for human papillomavirus (HPV): Secondary | ICD-10-CM | POA: Diagnosis present

## 2014-06-30 DIAGNOSIS — Z113 Encounter for screening for infections with a predominantly sexual mode of transmission: Secondary | ICD-10-CM | POA: Diagnosis present

## 2014-06-30 DIAGNOSIS — R5381 Other malaise: Secondary | ICD-10-CM

## 2014-06-30 DIAGNOSIS — R269 Unspecified abnormalities of gait and mobility: Secondary | ICD-10-CM

## 2014-06-30 DIAGNOSIS — Z20828 Contact with and (suspected) exposure to other viral communicable diseases: Secondary | ICD-10-CM

## 2014-06-30 DIAGNOSIS — E785 Hyperlipidemia, unspecified: Secondary | ICD-10-CM

## 2014-06-30 DIAGNOSIS — Z0000 Encounter for general adult medical examination without abnormal findings: Secondary | ICD-10-CM

## 2014-06-30 DIAGNOSIS — I10 Essential (primary) hypertension: Secondary | ICD-10-CM

## 2014-06-30 DIAGNOSIS — M810 Age-related osteoporosis without current pathological fracture: Secondary | ICD-10-CM

## 2014-06-30 DIAGNOSIS — Z124 Encounter for screening for malignant neoplasm of cervix: Secondary | ICD-10-CM | POA: Diagnosis present

## 2014-06-30 LAB — POCT WET PREP (WET MOUNT): Clue Cells Wet Prep Whiff POC: NEGATIVE

## 2014-06-30 MED ORDER — AMLODIPINE BESYLATE 10 MG PO TABS
10.0000 mg | ORAL_TABLET | Freq: Every day | ORAL | Status: DC
Start: 2014-06-30 — End: 2015-03-13

## 2014-06-30 NOTE — Progress Notes (Signed)
Patient ID: [name], female   DOB: 15-Nov-1950, 63 y.o.   MRN: [id number] Subjective:   CC: pap smear, mammogram  HPI:   As of 06/30/2014: -Colonoscopy: Done last year - normal per pt. Didn't say when to get the next. -Mammogram: Needs to get done. Has always been normal. -BP: 150s-160s/80s at home, 782 systolic when getting oral surgery likely due to nervous per pt. No CP or sob, dizziness, or syncope. Mild blurred vision, thinks needs glasses. -STD check: Unsure if ever had HIV tested. Amenable today. -Pap smears: Last done 2008, always normal. -Lipids: Last checked 2011 direct LDL 134. -Depression screen: No symptoms. -Falls risk assessment: Fell and scraped knee at home wreaching to get boot, tripped and fell, scraped right shin.  -Smoking status: Quit smoking 12-13 years ago, after smoking socially -TDAP, Zostavax, pneumococcal, influenza: Declines flu and TDAP shot. -Regular eye checkups: Not in 2 years. -Dental: Recent dental surgery, pulled teeth so she can get partial dentures. -Review of weight: 200lbs from 180 last visit, but 204 early July. -LMP: Menopause years ago. -Review of PMH: OA, left wrist pain, left foot deformity and pain, HLD, HTN, peripheral neuropathy, chest pain -Review of meds: Reviewed.   Review of Systems - Per HPI.   SH: Single 5-6 years. Smoking status: Quit smoking 12-13 years ago.    Objective:  Physical Exam BP 150/92  Pulse 72  Temp(Src) 98.6 F (37 C) (Oral)  Ht 5\' 6"  (1.676 m)  Wt 200 lb (90.719 kg)  BMI 32.30 kg/m2 GEN: NAD, pleasant, seated in exam room CV: RRR PULM: CTAB EXTR: No Le edema, left foot in boot, moderately inverted; mildly antalgic gait ABD: s/nt/ND HEENT: Thyroid normal size and consistency; Neck supple GU: normal vagina and mildly stenosed cervix, no abnormal mass, no CMT    Assessment:     Sarah [name] is a 63 y.o. female with here for health maintenance visit.    Plan:     # See problem list and after visit  summary for problem-specific plans.  # Health Maintenance:  - Mammogram contact information provided  - Wet prep, Pap smear, GC/Chlamydia, HIV/RPR today - Eye checkup now. - Full dental cleaning recommended - lipids and BMET today. - TDAP and flu shot - declines - Work on keeping weight steady with watching what you eat and regular daily exercise.   Follow-up: Follow up in 1 mont for f/u of BP.   Sarah Sinclair, MD [place]

## 2014-06-30 NOTE — Patient Instructions (Addendum)
Call to get a mammogram. We did labs today and I will call if any are NOT normal. Be sure to call and get eye and dental checkups annually. Increase amlodipine to 10mg  daily and continue checking home BPs. Follow up with me in 1 month. If you have any chest pain, trouble breathing, or other concerns in the meantime, seek immediate care. Continue to keep an eye on your weight and do pool or seated exercises.  Best,  Hilton Sinclair, MD

## 2014-07-01 LAB — LIPID PANEL
Cholesterol: 210 mg/dL — ABNORMAL HIGH (ref 0–200)
HDL: 34 mg/dL — ABNORMAL LOW (ref >39)
LDL Cholesterol: 121 mg/dL — ABNORMAL HIGH (ref 0–99)
Total CHOL/HDL Ratio: 6.2 Ratio
Triglycerides: 276 mg/dL — ABNORMAL HIGH (ref <150)
VLDL: 55 mg/dL — ABNORMAL HIGH (ref 0–40)

## 2014-07-01 LAB — BASIC METABOLIC PANEL
BUN: 15 mg/dL (ref 6–23)
CO2: 30 mEq/L (ref 19–32)
Calcium: 9.8 mg/dL (ref 8.4–10.5)
Chloride: 99 mEq/L (ref 96–112)
Creat: 0.87 mg/dL (ref 0.50–1.10)
Glucose, Bld: 90 mg/dL (ref 70–99)
Potassium: 3.7 mEq/L (ref 3.5–5.3)
Sodium: 137 mEq/L (ref 135–145)

## 2014-07-01 LAB — CERVICOVAGINAL ANCILLARY ONLY
Chlamydia: NEGATIVE
Neisseria Gonorrhea: NEGATIVE

## 2014-07-01 LAB — HIV ANTIBODY (ROUTINE TESTING W REFLEX): HIV 1&2 Ab, 4th Generation: NONREACTIVE

## 2014-07-01 LAB — RPR

## 2014-07-02 DIAGNOSIS — Z0000 Encounter for general adult medical examination without abnormal findings: Secondary | ICD-10-CM | POA: Insufficient documentation

## 2014-07-02 DIAGNOSIS — R2681 Unsteadiness on feet: Secondary | ICD-10-CM

## 2014-07-02 HISTORY — DX: Unsteadiness on feet: R26.81

## 2014-07-02 NOTE — Assessment & Plan Note (Signed)
Mechanism was reaching to get boot and tripping. - Finished with PT and they will keep working with right foot but cannot help left foot. - Continue PT. - Stay active to keep strong. - Bring me records from [place] visit for fall.  - Consider geri clinic at f/u.

## 2014-07-02 NOTE — Assessment & Plan Note (Signed)
Uncontrolled. - Continue monitor BP  - increased norvasc to 10mg  daily.  - F/u in 1 mo. If elevated at that time, refer to pharm clinic. - Encouraged making exercise part of daily life (pool or seated exercises to start).

## 2014-07-02 NOTE — Assessment & Plan Note (Signed)
-   Mammogram contact information provided  - Wet prep, Pap smear, GC/Chlamydia, HIV/RPR today - Eye checkup now. - Full dental cleaning recommended - lipids and BMET today. - TDAP and flu shot - declines - Work on keeping weight steady with watching what you eat and regular daily exercise.

## 2014-07-04 ENCOUNTER — Telehealth: Payer: Self-pay | Admitting: Family Medicine

## 2014-07-04 LAB — CYTOLOGY - PAP

## 2014-07-04 NOTE — Telephone Encounter (Signed)
No answer when I called Ms Cabeza. Please call to inform her that her ASCVD risk (for heart attack or stroke in next 10 years) is 14.6%, making increasing from low-intensity statin to high-intensity statin appropriate. If she is amenable to medication change (stopping lovastatin, starting atorvastatin (lipitor) or rosouvastatin (crestor)), I will send this in.  We also need to continue working on BP control, diet and exercise for weight management.   Thanks.  Hilton Sinclair, MD

## 2014-07-04 NOTE — Telephone Encounter (Signed)
LMOVM for pt to return call .Fleeger, Jessica Dawn  

## 2014-07-05 MED ORDER — ATORVASTATIN CALCIUM 40 MG PO TABS: 40.0000 mg | ORAL_TABLET | Freq: Every day | ORAL | Status: DC

## 2014-07-05 NOTE — Telephone Encounter (Signed)
Please let her know I have sent in atorvastatin. She will take this daily and stop taking the lovastatin. Side effects are rare, but she should be re-evaluated if she develops severe worsened muscle pain, swelling, redness, or other concerns.  Thanks.  Hilton Sinclair, MD

## 2014-07-05 NOTE — Addendum Note (Signed)
Addended by: Conni Slipper T on: 07/05/2014 02:58 PM   Modules accepted: Orders, Medications

## 2014-07-05 NOTE — Telephone Encounter (Signed)
LM on identified machine with message from MD. Sarah [name]

## 2014-07-05 NOTE — Telephone Encounter (Signed)
Pt returned call and message read.  She is fine with starting a new medication. Jazmin Hartsell,CMA

## 2014-07-11 ENCOUNTER — Other Ambulatory Visit: Payer: Self-pay | Admitting: *Deleted

## 2014-07-11 ENCOUNTER — Other Ambulatory Visit: Payer: Self-pay

## 2014-07-11 DIAGNOSIS — E2839 Other primary ovarian failure: Secondary | ICD-10-CM

## 2014-07-11 DIAGNOSIS — Z1231 Encounter for screening mammogram for malignant neoplasm of breast: Secondary | ICD-10-CM

## 2014-07-13 ENCOUNTER — Ambulatory Visit
Admission: RE | Admit: 2014-07-13 | Discharge: 2014-07-13 | Disposition: A | Discharge status: Home / Self Care | Payer: Medicare Other | Source: Ambulatory Visit

## 2014-07-13 DIAGNOSIS — Z1231 Encounter for screening mammogram for malignant neoplasm of breast: Secondary | ICD-10-CM

## 2014-07-17 ENCOUNTER — Encounter: Payer: Self-pay | Admitting: Family Medicine

## 2014-07-20 ENCOUNTER — Ambulatory Visit: Payer: Medicare Other

## 2014-09-27 ENCOUNTER — Ambulatory Visit
Admission: RE | Admit: 2014-09-27 | Discharge: 2014-09-27 | Disposition: A | Discharge status: Home / Self Care | Payer: Medicare Other | Source: Ambulatory Visit | Attending: *Deleted | Admitting: *Deleted

## 2014-09-27 DIAGNOSIS — E2839 Other primary ovarian failure: Secondary | ICD-10-CM

## 2015-01-27 ENCOUNTER — Encounter (HOSPITAL_COMMUNITY): Payer: Self-pay

## 2015-01-27 ENCOUNTER — Emergency Department (HOSPITAL_COMMUNITY): Payer: Medicare Other

## 2015-01-27 ENCOUNTER — Observation Stay (HOSPITAL_COMMUNITY)
Admission: EM | Admit: 2015-01-27 | Discharge: 2015-01-28 | Disposition: A | Discharge status: Home / Self Care | Payer: Medicare Other | Attending: Family Medicine | Admitting: Family Medicine

## 2015-01-27 DIAGNOSIS — R5383 Other fatigue: Secondary | ICD-10-CM | POA: Insufficient documentation

## 2015-01-27 DIAGNOSIS — Z880 Allergy status to penicillin: Secondary | ICD-10-CM | POA: Diagnosis not present

## 2015-01-27 DIAGNOSIS — R0602 Shortness of breath: Secondary | ICD-10-CM | POA: Diagnosis not present

## 2015-01-27 DIAGNOSIS — Z7982 Long term (current) use of aspirin: Secondary | ICD-10-CM | POA: Diagnosis not present

## 2015-01-27 DIAGNOSIS — M1990 Unspecified osteoarthritis, unspecified site: Secondary | ICD-10-CM | POA: Diagnosis not present

## 2015-01-27 DIAGNOSIS — Z791 Long term (current) use of non-steroidal anti-inflammatories (NSAID): Secondary | ICD-10-CM | POA: Diagnosis not present

## 2015-01-27 DIAGNOSIS — Z79899 Other long term (current) drug therapy: Secondary | ICD-10-CM | POA: Diagnosis not present

## 2015-01-27 DIAGNOSIS — I10 Essential (primary) hypertension: Secondary | ICD-10-CM | POA: Diagnosis not present

## 2015-01-27 DIAGNOSIS — R079 Chest pain, unspecified: Principal | ICD-10-CM | POA: Insufficient documentation

## 2015-01-27 LAB — BASIC METABOLIC PANEL
Anion gap: 11 (ref 5–15)
BUN: 14 mg/dL (ref 6–23)
CO2: 27 mmol/L (ref 19–32)
Calcium: 9.3 mg/dL (ref 8.4–10.5)
Chloride: 100 mmol/L (ref 96–112)
Creatinine, Ser: 0.89 mg/dL (ref 0.50–1.10)
GFR calc Af Amer: 79 mL/min — ABNORMAL LOW (ref >90)
GFR calc non Af Amer: 69 mL/min — ABNORMAL LOW (ref >90)
Glucose, Bld: 141 mg/dL — ABNORMAL HIGH (ref 70–99)
Potassium: 4.1 mmol/L (ref 3.5–5.1)
Sodium: 138 mmol/L (ref 135–145)

## 2015-01-27 LAB — CBC WITH DIFFERENTIAL/PLATELET
Basophils Absolute: 0 10*3/uL (ref 0.0–0.1)
Basophils Relative: 1 % (ref 0–1)
Eosinophils Absolute: 0.4 10*3/uL (ref 0.0–0.7)
Eosinophils Relative: 8 % — ABNORMAL HIGH (ref 0–5)
HCT: 42.5 % (ref 36.0–46.0)
Hemoglobin: 14 g/dL (ref 12.0–15.0)
Lymphocytes Relative: 47 % — ABNORMAL HIGH (ref 12–46)
Lymphs Abs: 2.6 10*3/uL (ref 0.7–4.0)
MCH: 30.5 pg (ref 26.0–34.0)
MCHC: 32.9 g/dL (ref 30.0–36.0)
MCV: 92.6 fL (ref 78.0–100.0)
Monocytes Absolute: 0.3 10*3/uL (ref 0.1–1.0)
Monocytes Relative: 6 % (ref 3–12)
Neutro Abs: 2.1 10*3/uL (ref 1.7–7.7)
Neutrophils Relative %: 38 % — ABNORMAL LOW (ref 43–77)
Platelets: 216 10*3/uL (ref 150–400)
RBC: 4.59 MIL/uL (ref 3.87–5.11)
RDW: 12.2 % (ref 11.5–15.5)
WBC: 5.4 10*3/uL (ref 4.0–10.5)

## 2015-01-27 LAB — TROPONIN I
Troponin I: <0.03 ng/mL (ref <0.031)
Troponin I: <0.03 ng/mL (ref <0.031)

## 2015-01-27 LAB — BRAIN NATRIURETIC PEPTIDE: B Natriuretic Peptide: 16.2 pg/mL (ref 0.0–100.0)

## 2015-01-27 MED ORDER — METOPROLOL TARTRATE 25 MG PO TABS
25.0000 mg | ORAL_TABLET | Freq: Two times a day (BID) | ORAL | Status: DC
  Administered 2015-01-27 – 2015-01-28 (×2): 25 mg via ORAL
  Filled 2015-01-27 (×2): qty 1

## 2015-01-27 MED ORDER — MELOXICAM 7.5 MG PO TABS
15.0000 mg | ORAL_TABLET | Freq: Every day | ORAL | Status: DC
  Administered 2015-01-27: 15 mg via ORAL
  Filled 2015-01-27: qty 2

## 2015-01-27 MED ORDER — SPIRONOLACTONE 25 MG PO TABS
25.0000 mg | ORAL_TABLET | Freq: Every day | ORAL | Status: DC
Start: 2015-01-27 — End: 2015-01-28
  Administered 2015-01-27 – 2015-01-28 (×2): 25 mg via ORAL
  Filled 2015-01-27 (×2): qty 1

## 2015-01-27 MED ORDER — CALCIUM CARBONATE-VITAMIN D 500-200 MG-UNIT PO TABS
1.0000 | ORAL_TABLET | Freq: Two times a day (BID) | ORAL | Status: DC
  Administered 2015-01-27 – 2015-01-28 (×2): 1 via ORAL
  Filled 2015-01-27 (×2): qty 1

## 2015-01-27 MED ORDER — AMLODIPINE BESYLATE 10 MG PO TABS
10.0000 mg | ORAL_TABLET | Freq: Every day | ORAL | Status: DC
  Administered 2015-01-28: 10 mg via ORAL
  Filled 2015-01-27: qty 1

## 2015-01-27 MED ORDER — HYDROCHLOROTHIAZIDE 12.5 MG PO CAPS
12.5000 mg | ORAL_CAPSULE | Freq: Every day | ORAL | Status: DC
  Administered 2015-01-27 – 2015-01-28 (×2): 12.5 mg via ORAL
  Filled 2015-01-27 (×2): qty 1

## 2015-01-27 MED ORDER — HEPARIN SODIUM (PORCINE) 5000 UNIT/ML IJ SOLN
5000.0000 [IU] | Freq: Three times a day (TID) | INTRAMUSCULAR | Status: DC
  Filled 2015-01-27: qty 1

## 2015-01-27 MED ORDER — ASPIRIN 81 MG PO CHEW
81.0000 mg | CHEWABLE_TABLET | Freq: Every day | ORAL | Status: DC
  Administered 2015-01-28: 81 mg via ORAL
  Filled 2015-01-27 (×2): qty 1

## 2015-01-27 MED ORDER — ATORVASTATIN CALCIUM 40 MG PO TABS
40.0000 mg | ORAL_TABLET | Freq: Every day | ORAL | Status: DC
  Administered 2015-01-27 – 2015-01-28 (×2): 40 mg via ORAL
  Filled 2015-01-27 (×2): qty 1

## 2015-01-27 MED ORDER — ONDANSETRON HCL 4 MG/2ML IJ SOLN: 4.0000 mg | Freq: Four times a day (QID) | INTRAMUSCULAR | Status: DC | PRN

## 2015-01-27 MED ORDER — LISINOPRIL 20 MG PO TABS
20.0000 mg | ORAL_TABLET | Freq: Every day | ORAL | Status: DC
  Administered 2015-01-27 – 2015-01-28 (×2): 20 mg via ORAL
  Filled 2015-01-27: qty 2
  Filled 2015-01-27: qty 1

## 2015-01-27 MED ORDER — ACETAMINOPHEN 325 MG PO TABS
650.0000 mg | ORAL_TABLET | ORAL | Status: DC | PRN
  Administered 2015-01-27: 650 mg via ORAL
  Filled 2015-01-27: qty 2

## 2015-01-27 MED ORDER — LISINOPRIL-HYDROCHLOROTHIAZIDE 20-12.5 MG PO TABS: 2.0000 | ORAL_TABLET | Freq: Every day | ORAL | Status: DC

## 2015-01-27 MED ORDER — TRAMADOL HCL 50 MG PO TABS
50.0000 mg | ORAL_TABLET | Freq: Two times a day (BID) | ORAL | Status: DC | PRN
  Administered 2015-01-27: 50 mg via ORAL
  Filled 2015-01-27: qty 1

## 2015-01-27 NOTE — H&P (Signed)
[facility] Admission History and Physical Service Pager: [phone]  Patient name: Sarah [name] Medical record number: [id number] Date of birth: Mar 12, 1951 Age: 64 y.o. Gender: female  Primary Care Provider: Conni Slipper, MD Consultants: None Code Status: Full  Chief Complaint: chest pain  Assessment and Plan: Sarah [name] is a 64 y.o. female presenting with chest pain. PMH is significant for HTN, HLD, remote h/o Polio with residual right leg weakness   Atypical chest pain: Concern for ACS given symptoms, neg troponin in the ED, normal EKG but only 1 hour after onset of symptoms, will need continued evaluation. HEART score 3. Doubt PE. No CXR infiltrate or cardiomegaly. Euvolemic, BNP 16.2.  - Admit to FMTS on telemetry unit for ACS rule out.  - cycle troponins (initial negative) - repeat EKG in AM - telemetry - risk stratifying labs: Hb A1c (no record of this), TSH (no record of this), lipids (marked dyslipidemia in Nov 2015: HDL 34, LDL 121) - O2 by [place] prn hypoxemia  HTN: Potentially a contributor to chest pain as she did not take her blood pressure medications this AM. Historically uncontrolled despite reported adherence to beta blocker, ACE-HCTZ, CCB, AND spironolactone. Not severe range, no evidence of acute end-organ damage. No stigmata of CHF, no prior echocardiogram.  - continue all home medications. OK to restart ACE and diuretics as no signs of AKI or CKD and no contrast studies anticipated.   Dyslipidemia: ASCVD 10-year risk: 14.5% (as nonsmoker; quit ca. 2012) Last lipid panel 06/2014 elevated LDL 121, trigly 276, cholesterol 210, low HDL - continue home lipitor 40 mg, consider increasing dose to 80 - AM fasting lipid panel  Gait instability: Secondary to h/o Polio - Fall precautions  FEN/GI: cardiac diet, KVO Prophylaxis: Heparin, SCDs  Disposition: Pending clinical imporvement  History of Present Illness: Sarah [name] is a 64 y.o.  female presenting with chest pain that started this AM at 11:30 and lasted for approximatekly 15-20 mins. Associated with radiated to her left arm, SOB 2/2 difficultly breathing 2/2 pain. She reports that nothing made the pain better or worse (e.g. not exertional, positional, or pleuritic). It was sharp in nature. When it started she called her daughter who was in the home with her.  At that time she instead slid to the floor. Per her daughter she did not have trauma. She then helped her to stand and walk. She was able to walk but only very slowly. She did not speak in full sentences but her speech was normal in quality but she appeared to be in a fog. Her daughter then called EMS. The patient's pain resolved with aspirin administation by EMS. She did not get SL nitrates. The patient does not remember between the onset of her pain and being in the ambulance.  She reports now right shoulder pain that is aching and constant, mostly noted on movement. She states that this is new and does not remember trauma or anything that may have caused this.  Denies nausea/emesis, current chest pain or SOB. She did forget to take her blood pressure medications this AM  Review Of Systems: Per HPI with the following additions:  Otherwise 12 point review of systems was performed and was unremarkable.  Patient Active Problem List   Diagnosis Date Noted  . Gait instability 07/02/2014  . Health care maintenance 07/02/2014  . Wrist pain, left 10/23/2011  . Foot pain, left 08/01/2011  . FOOT DEFORMITY, ACQUIRED 05/23/2009  . CHEST PAIN  01/01/2008  . HYPERLIPIDEMIA 07/27/2007  . OBESITY, NOS 12/04/2006  . NEUROPATHY, PERIPHERAL 12/04/2006  . HYPERTENSION, BENIGN SYSTEMIC 12/04/2006  . OSTEOARTHRITIS, MULTI SITES 12/04/2006   Past Medical History: Past Medical History  Diagnosis Date  . Hypertension   . Arthritis    Past Surgical History: History reviewed. No pertinent past surgical history. Social  History: History  Substance Use Topics  . Smoking status: Never Smoker   . Smokeless tobacco: Not on file  . Alcohol Use: No   Additional social history: denies etoh, tobacco, drug use Please also refer to relevant sections of EMR.  Family History: No family history on file. Allergies and Medications: Allergies  Allergen Reactions  . Penicillins Hives   No current facility-administered medications on file prior to encounter.   Current Outpatient Prescriptions on File Prior to Encounter  Medication Sig Dispense Refill  . amLODipine (NORVASC) 10 MG tablet Take 1 tablet (10 mg total) by mouth daily. 30 tablet 3  . aspirin 81 MG EC tablet Take 81 mg by mouth daily.      Marland Kitchen atorvastatin (LIPITOR) 40 MG tablet Take 1 tablet (40 mg total) by mouth daily. 30 tablet 3  . Calcium Carbonate-Vitamin D (CALTRATE 600+D) 600-400 MG-UNIT per tablet Take 1 tablet by mouth 2 (two) times daily.      . colchicine 0.6 MG tablet Please take 2 tablets by mouth (total 0.12 mg) and then one hour later please take one tablet (total 0.6 mg) 3 tablet 0  . lisinopril-hydrochlorothiazide (PRINZIDE,ZESTORETIC) 20-12.5 MG per tablet Take 2 tablets by mouth daily. 60 tablet 11  . meloxicam (MOBIC) 15 MG tablet Take 15 mg by mouth daily.    . metoprolol tartrate (LOPRESSOR) 25 MG tablet Take 1 tablet (25 mg total) by mouth 2 (two) times daily. 60 tablet 11  . spironolactone (ALDACTONE) 25 MG tablet Take 1 tablet (25 mg total) by mouth daily. 30 tablet 11  . traMADol (ULTRAM) 50 MG tablet Take 1 tablet (50 mg total) by mouth every 12 (twelve) hours as needed for moderate pain. 30 tablet 1    Objective: BP 149/77 mmHg  Pulse 67  Temp(Src) 98.7 F (37.1 C) (Oral)  Resp 16  SpO2 95% Exam: General: NAD, lying comfortably in bed HEENT: NCAT, MMM Cardiovascular: RRR, no murmurs Respiratory: CTB, no wheezes Abdomen: soft, non tender, non distended Extremities: boot on left foot, right extremity + DP pulse Skin:  warm, well perfused Neuro: Cranial Nerves II - XII - II - Visual field intact OU. III, IV, VI - Extraocular movements intact. V - Facial sensation intact bilaterally. VII - Facial movement intact bilaterally. VIII - Hearing intact bilaterally. X -  no dysarthria. XI - Chin turning & shoulder shrug intact bilaterally. XII - Tongue protrusion intact.  Motor Strength - The patient's strength was 5/5 in all extremities right upper, 4/5 left upper   Bulk was normal and fasciculations were absent.  Motor Tone - Muscle tone was assessed at the neck and appendages and was normal . Sensory - Light touch, temperature/pinprick were assessed and were symmetrical.   Coordination - The patient had normal movements in the hands with no ataxia or dysmetria. Tremor was absent.  Gait and Station - deferred due to safety concerns.   Labs and Imaging: CBC BMET   Recent Labs Lab 01/27/15 1244  WBC 5.4  HGB 14.0  HCT 42.5  PLT 216    Recent Labs Lab 01/27/15 1244  NA 138  K 4.1  CL 100  CO2 27  BUN 14  CREATININE 0.89  GLUCOSE 141*  CALCIUM 9.3      Veatrice Bourbon, MD 01/27/2015, 3:57 PM PGY-1, [facility] pager: [phone], text pages welcome  I have seen and evaluated the patient with Dr. Lincoln Brigham. I am in agreement with the note above in its revised form. My additions are in red.  Stellarose Cerny B. Bonner Puna, MD, PGY-2 01/28/2015 12:11 AM

## 2015-01-27 NOTE — ED Provider Notes (Signed)
CSN: [id number]     Arrival date & time 01/27/15  1219 History   First MD Initiated Contact with Patient 01/27/15 1230     Chief Complaint  Patient presents with  . Chest Pain     (Consider location/radiation/quality/duration/timing/severity/associated sxs/prior Treatment) HPI Comments: Patient presents to the ER for evaluation of chest pain. Patient reports that she has been experiencing pain in the left side of her chest since this morning. She was on her way to the emergency department when the pain worsened. She has noticed that she has been short of breath with the pain. EMS reports that she is extremely weak, could not stand on her own because of lower extremity weakness. She has noticed this this morning herself. Patient took aspirin prior to arrival to the emergency department. At arrival to the ER she complains of headache, reports that her chest pain has resolved.  Patient is a 64 y.o. female presenting with chest pain.  Chest Pain Associated symptoms: fatigue and shortness of breath     Past Medical History  Diagnosis Date  . Hypertension   . Arthritis    History reviewed. No pertinent past surgical history. No family history on file. History  Substance Use Topics  . Smoking status: Never Smoker   . Smokeless tobacco: Not on file  . Alcohol Use: No   OB History    No data available     Review of Systems  Constitutional: Positive for fatigue.  Respiratory: Positive for shortness of breath.   Cardiovascular: Positive for chest pain.  All other systems reviewed and are negative.     Allergies  Penicillins  Home Medications   Prior to Admission medications   Medication Sig Start Date End Date Taking? Authorizing Provider  amLODipine (NORVASC) 10 MG tablet Take 1 tablet (10 mg total) by mouth daily. 06/30/14   Hilton Sinclair, MD  aspirin 81 MG EC tablet Take 81 mg by mouth daily.      Historical Provider, MD  atorvastatin (LIPITOR) 40 MG tablet Take 1  tablet (40 mg total) by mouth daily. 07/05/14   Hilton Sinclair, MD  Calcium Carbonate-Vitamin D (CALTRATE 600+D) 600-400 MG-UNIT per tablet Take 1 tablet by mouth 2 (two) times daily.      Historical Provider, MD  colchicine 0.6 MG tablet Please take 2 tablets by mouth (total 0.12 mg) and then one hour later please take one tablet (total 0.6 mg) 10/09/13   Marissa Sciacca, PA-C  lisinopril-hydrochlorothiazide (PRINZIDE,ZESTORETIC) 20-12.5 MG per tablet Take 2 tablets by mouth daily. 03/11/14   Hilton Sinclair, MD  meloxicam (MOBIC) 15 MG tablet Take 15 mg by mouth daily.    Historical Provider, MD  metoprolol tartrate (LOPRESSOR) 25 MG tablet Take 1 tablet (25 mg total) by mouth 2 (two) times daily. 03/11/14   Hilton Sinclair, MD  spironolactone (ALDACTONE) 25 MG tablet Take 1 tablet (25 mg total) by mouth daily. 03/11/14   Hilton Sinclair, MD  traMADol (ULTRAM) 50 MG tablet Take 1 tablet (50 mg total) by mouth every 12 (twelve) hours as needed for moderate pain. 03/11/14   Hilton Sinclair, MD   BP 159/85 mmHg  Pulse 78  Temp(Src) 98.7 F (37.1 C) (Oral)  Resp 20  SpO2 99% Physical Exam  Constitutional: She is oriented to person, place, and time. She appears well-developed and well-nourished. No distress.  HENT:  Head: Normocephalic and atraumatic.  Right Ear: Hearing normal.  Left Ear: Hearing normal.  Nose:  Nose normal.  Mouth/Throat: Oropharynx is clear and moist and mucous membranes are normal.  Eyes: Conjunctivae and EOM are normal. Pupils are equal, round, and reactive to light.  Neck: Normal range of motion. Neck supple.  Cardiovascular: Regular rhythm, S1 normal and S2 normal.  Exam reveals no gallop and no friction rub.   No murmur heard. Pulmonary/Chest: Effort normal and breath sounds normal. No respiratory distress. She exhibits no tenderness.  Abdominal: Soft. Normal appearance and bowel sounds are normal. There is no hepatosplenomegaly. There is no tenderness.  There is no rebound, no guarding, no tenderness at McBurney's point and negative Murphy's sign. No hernia.  Musculoskeletal: Normal range of motion.  Neurological: She is alert and oriented to person, place, and time. She has normal strength. No cranial nerve deficit or sensory deficit. Coordination normal. GCS eye subscore is 4. GCS verbal subscore is 5. GCS motor subscore is 6.  Skin: Skin is warm, dry and intact. No rash noted. No cyanosis.  Psychiatric: She has a normal mood and affect. Her speech is normal and behavior is normal. Thought content normal.  Nursing note and vitals reviewed.   ED Course  Procedures (including critical care time) Labs Review Labs Reviewed  CBC WITH DIFFERENTIAL/PLATELET  BASIC METABOLIC PANEL  TROPONIN I  BRAIN NATRIURETIC PEPTIDE    Imaging Review No results found.   EKG Interpretation   Date/Time:  Friday January 27 2015 12:21:59 EDT Ventricular Rate:  69 PR Interval:  162 QRS Duration: 88 QT Interval:  432 QTC Calculation: 463 R Axis:   -19 Text Interpretation:  Sinus rhythm Borderline left axis deviation No  significant change since last tracing Confirmed by POLLINA  MD,  CHRISTOPHER [phone]) on 01/27/2015 12:29:17 PM      MDM   Final diagnoses:  Chest pain    Patient presented to the ER for evaluation of chest pain. Patient was dispensed left-sided chest pain with radiation to left arm earlier today. She appeared to be having symptoms intermittently. She did not identify alleviating or exacerbating factors. Patient was given aspirin and had resolution of her pain. She is currently pain-free. Patient reports that she has been treated with nitroglycerin in the past, but is not sure if she has a history of heart disease. She does have multiple cardiac risk factors including hypertension, high cholesterol, age and appears to have borderline elevation of her blood sugars on previous records. Patient will require hospitalization for further  workup chest pain.    Orpah Greek, MD 01/27/15 [phone]

## 2015-01-27 NOTE — ED Notes (Signed)
Pt. Was on her way to ED via  Vehicle, and pt. Developed chest pain,  They pulled off the road and called the ambulance.  When paramedics arrived, she went to get up and her knees buckled.  When she got into the ambulance, everything cleared up.  She took 81mg   Aspirin prior to EMS arrival ,  Pt. Was given  3 , 81mg  ASA by EMS  Pt. Denies any chest pain , she is having a headache .GCS 15.   BP 210/120 hx of BP and pt. Forgot to take her BP medications today.

## 2015-01-27 NOTE — ED Notes (Signed)
Pt monitored by pulse ox, bp cuff, and 12-lead. 

## 2015-01-27 NOTE — ED Notes (Signed)
MD paged for telemetry bed request

## 2015-01-27 NOTE — ED Notes (Signed)
Family Practice MDs at bedside 

## 2015-01-27 NOTE — ED Notes (Signed)
Pt. Unable to stand at the bedside to ambulate to the bathroom, due to pt.'s legs were weak.  Used bedpan, and pt. Was able to move all extremities without any difficulty.

## 2015-01-27 NOTE — ED Notes (Signed)
Spoke to [place]; pt to be placed on telemetry unit, bed request to be placed by MD; bed control made aware

## 2015-01-28 DIAGNOSIS — R0602 Shortness of breath: Secondary | ICD-10-CM | POA: Diagnosis not present

## 2015-01-28 DIAGNOSIS — R5383 Other fatigue: Secondary | ICD-10-CM | POA: Diagnosis not present

## 2015-01-28 DIAGNOSIS — R072 Precordial pain: Secondary | ICD-10-CM | POA: Diagnosis not present

## 2015-01-28 DIAGNOSIS — I10 Essential (primary) hypertension: Secondary | ICD-10-CM | POA: Diagnosis not present

## 2015-01-28 DIAGNOSIS — R079 Chest pain, unspecified: Secondary | ICD-10-CM | POA: Diagnosis not present

## 2015-01-28 LAB — TROPONIN I
Troponin I: <0.03 ng/mL (ref <0.031)
Troponin I: <0.03 ng/mL (ref <0.031)

## 2015-01-28 MED ORDER — ATORVASTATIN CALCIUM 80 MG PO TABS: 80.0000 mg | ORAL_TABLET | Freq: Every day | ORAL | Status: DC

## 2015-01-28 MED ORDER — MELOXICAM 7.5 MG PO TABS: 15.0000 mg | ORAL_TABLET | Freq: Every day | ORAL | Status: DC | PRN

## 2015-01-28 NOTE — Discharge Instructions (Signed)
Good to see you. You were in the hospital due to chest pain. This did not seem to be your heart according to the EKG and labwork.  Continue your aspirin, blood pressure medications, and INCREASE lipitor to 80mg  daily. You can take 2 of the 40mg  tablets and when you run out, I have sent in a new prescription for 80mg  daily. Rare side effects of this medication include severe muscle pain. This medicine helps further protect against stroke/heart attack. Follow up with me in clinic within 1-2 weeks. If I am not available, you can see anyone who is. Call Monday to make this appointment. Seek immediate care if you again have chest pain or trouble breathing or any other concerns.  Best,  Hilton Sinclair, MD   Chest Pain (Nonspecific) It is often hard to give a specific diagnosis for the cause of chest pain. There is always a chance that your pain could be related to something serious, such as a heart attack or a blood clot in the lungs. You need to follow up with your health care provider for further evaluation. CAUSES   Heartburn.  Pneumonia or bronchitis.  Anxiety or stress.  Inflammation around your heart (pericarditis) or lung (pleuritis or pleurisy).  A blood clot in the lung.  A collapsed lung (pneumothorax). It can develop suddenly on its own (spontaneous pneumothorax) or from trauma to the chest.  Shingles infection (herpes zoster virus). The chest wall is composed of bones, muscles, and cartilage. Any of these can be the source of the pain.  The bones can be bruised by injury.  The muscles or cartilage can be strained by coughing or overwork.  The cartilage can be affected by inflammation and become sore (costochondritis). DIAGNOSIS  Lab tests or other studies may be needed to find the cause of your pain. Your health care provider may have you take a test called an ambulatory electrocardiogram (ECG). An ECG records your heartbeat patterns over a 24-hour period. You may also  have other tests, such as:  Transthoracic echocardiogram (TTE). During echocardiography, sound waves are used to evaluate how blood flows through your heart.  Transesophageal echocardiogram (TEE).  Cardiac monitoring. This allows your health care provider to monitor your heart rate and rhythm in real time.  Holter monitor. This is a portable device that records your heartbeat and can help diagnose heart arrhythmias. It allows your health care provider to track your heart activity for several days, if needed.  Stress tests by exercise or by giving medicine that makes the heart beat faster. TREATMENT   Treatment depends on what may be causing your chest pain. Treatment may include:  Acid blockers for heartburn.  Anti-inflammatory medicine.  Pain medicine for inflammatory conditions.  Antibiotics if an infection is present.  You may be advised to change lifestyle habits. This includes stopping smoking and avoiding alcohol, caffeine, and chocolate.  You may be advised to keep your head raised (elevated) when sleeping. This reduces the chance of acid going backward from your stomach into your esophagus. Most of the time, nonspecific chest pain will improve within 2-3 days with rest and mild pain medicine.  HOME CARE INSTRUCTIONS   If antibiotics were prescribed, take them as directed. Finish them even if you start to feel better.  For the next few days, avoid physical activities that bring on chest pain. Continue physical activities as directed.  Do not use any tobacco products, including cigarettes, chewing tobacco, or electronic cigarettes.  Avoid drinking alcohol.  Only take medicine as directed by your health care provider.  Follow your health care provider's suggestions for further testing if your chest pain does not go away.  Keep any follow-up appointments you made. If you do not go to an appointment, you could develop lasting (chronic) problems with pain. If there is any  problem keeping an appointment, call to reschedule. SEEK MEDICAL CARE IF:   Your chest pain does not go away, even after treatment.  You have a rash with blisters on your chest.  You have a fever. SEEK IMMEDIATE MEDICAL CARE IF:   You have increased chest pain or pain that spreads to your arm, neck, jaw, back, or abdomen.  You have shortness of breath.  You have an increasing cough, or you cough up blood.  You have severe back or abdominal pain.  You feel nauseous or vomit.  You have severe weakness.  You faint.  You have chills. This is an emergency. Do not wait to see if the pain will go away. Get medical help at once. Call your local emergency services (911 in [place] Do not drive yourself to the hospital. MAKE SURE YOU:   Understand these instructions.  Will watch your condition.  Will get help right away if you are not doing well or get worse. Document Released: 07/03/2005 Document Revised: 09/28/2013 Document Reviewed: 04/28/2008 [facility] Patient Information 2015 [place], [place]. This information is not intended to replace advice given to you by your health care provider. Make sure you discuss any questions you have with your health care provider.

## 2015-01-28 NOTE — Progress Notes (Signed)
UR completed 

## 2015-01-28 NOTE — Discharge Summary (Signed)
[facility] Discharge Summary  Patient name: Sarah [name] Medical record number: [id number] Date of birth: January 22, 1951 Age: 64 y.o. Gender: female Date of Admission: 01/27/2015  Date of Discharge: 01/28/2015  Admitting Physician: Blane Ohara McDiarmid, MD  Primary Care Provider: Conni Slipper, MD Consultants: None  Indication for Hospitalization: Atypical chest pain  Discharge Diagnoses/Problem List:  Atypical chest pain HTN Dyslipidemia Gait instability  Disposition: To home  Discharge Condition: Improved  Discharge Exam: BP 148/87 mmHg  Pulse 64  Temp(Src) 98 F (36.7 C) (Oral)  Resp 18  Ht 5\' 4"  (1.626 m)  Wt 209 lb 6.4 oz (94.983 kg)  BMI 35.93 kg/m2  SpO2 100%  GEN: NAD, pleasant, laying in bed CV: RRR, no m/r/g; no reproduced chest tenderenss, 2+ B radial pulses PULM: CTAB, normal effort ABD: S/NT/ND EXTR: No LE edema or calf tenderness Right shoulder no deformity or swelling Appears to have preserved ROM Right knee no effusion or erythema, mild generalized anterior tenderness  Brief Hospital Course:  Patient was admitted 01/27/15 for atypical sounding chest pain though concern for ACS. HEART score 3. Pain resolved with aspirin en route to ED. Troponins x 4 were negative, EKG was normal, CXR benign, and pt euvolemic appearing with normal BNP. Monitored overnight with patient continuing to be pain or dyspnea-free. Repeat AM EKG was also benign other than mild bradycardia at 59. No findings on telemetry. Risk stratification labs ordered and pending on discharge. Atorvastatin increased to 80mg  daily and continued aspirin and antihypertensives on discharge.    Issues for Follow Up:  1. F/u pending risk stratification labs 2. Consider cardiology referral outpatient especially if sx recur 3. Consider further workup of difficult to manage BP on multiple antihypertensives 4. Patient brought up right shoulder pain with grossly benign exam; Also  right knee pain with no effusion. F/u outpatient.  Significant Procedures: None  Significant Labs and Imaging:   Recent Labs Lab 01/27/15 1244  WBC 5.4  HGB 14.0  HCT 42.5  PLT 216    Recent Labs Lab 01/27/15 1244  NA 138  K 4.1  CL 100  CO2 27  GLUCOSE 141*  BUN 14  CREATININE 0.89  CALCIUM 9.3      Results/Tests Pending at Time of Discharge: lipid panel, A1c, TSH  Discharge Medications:    Medication List    TAKE these medications        amLODipine 10 MG tablet  Commonly known as:  NORVASC  Take 1 tablet (10 mg total) by mouth daily.     aspirin 81 MG EC tablet  Take 81 mg by mouth daily.     atorvastatin 80 MG tablet  Commonly known as:  LIPITOR  Take 1 tablet (80 mg total) by mouth daily.     CALTRATE 600+D 600-400 MG-UNIT per tablet  Generic drug:  Calcium Carbonate-Vitamin D  Take 1 tablet by mouth 2 (two) times daily.     colchicine 0.6 MG tablet  Please take 2 tablets by mouth (total 0.12 mg) and then one hour later please take one tablet (total 0.6 mg)     lisinopril-hydrochlorothiazide 20-12.5 MG per tablet  Commonly known as:  PRINZIDE,ZESTORETIC  Take 2 tablets by mouth daily.     meloxicam 15 MG tablet  Commonly known as:  MOBIC  Take 15 mg by mouth daily.     metoprolol tartrate 25 MG tablet  Commonly known as:  LOPRESSOR  Take 1 tablet (25 mg total) by mouth 2 (  two) times daily.     spironolactone 25 MG tablet  Commonly known as:  ALDACTONE  Take 1 tablet (25 mg total) by mouth daily.     traMADol 50 MG tablet  Commonly known as:  ULTRAM  Take 1 tablet (50 mg total) by mouth every 12 (twelve) hours as needed for moderate pain.        Discharge Instructions: Please refer to Patient Instructions section of EMR for full details.  Patient was counseled important signs and symptoms that should prompt return to medical care, changes in medications, dietary instructions, activity restrictions, and follow up appointments.    Follow-Up Appointments: Follow-up Information    Follow up with Conni Slipper, MD. Schedule an appointment as soon as possible for a visit in 1 week.   Specialty:  Family Medicine   Why:  for hospital follow up   Contact information:   [place] [address] [phone]       Hilton Sinclair, MD 01/28/2015, 11:14 AM PGY-3, [place]

## 2015-01-30 LAB — HEMOGLOBIN A1C
Hgb A1c MFr Bld: 5.9 % — ABNORMAL HIGH (ref 4.8–5.6)
Mean Plasma Glucose: 123 mg/dL

## 2015-02-14 ENCOUNTER — Encounter: Payer: Self-pay | Admitting: Family Medicine

## 2015-02-14 ENCOUNTER — Telehealth: Payer: Self-pay | Admitting: Family Medicine

## 2015-02-14 ENCOUNTER — Ambulatory Visit (INDEPENDENT_AMBULATORY_CARE_PROVIDER_SITE_OTHER): Payer: Medicare Other | Admitting: Family Medicine

## 2015-02-14 VITALS — BP 144/72 | HR 84 | Temp 98.3°F | Ht 64.0 in | Wt 214.0 lb

## 2015-02-14 DIAGNOSIS — R7309 Other abnormal glucose: Secondary | ICD-10-CM

## 2015-02-14 DIAGNOSIS — E785 Hyperlipidemia, unspecified: Secondary | ICD-10-CM | POA: Diagnosis not present

## 2015-02-14 DIAGNOSIS — R0789 Other chest pain: Secondary | ICD-10-CM

## 2015-02-14 DIAGNOSIS — M21969 Unspecified acquired deformity of unspecified lower leg: Secondary | ICD-10-CM

## 2015-02-14 DIAGNOSIS — R2681 Unsteadiness on feet: Secondary | ICD-10-CM

## 2015-02-14 DIAGNOSIS — M25511 Pain in right shoulder: Secondary | ICD-10-CM | POA: Diagnosis present

## 2015-02-14 DIAGNOSIS — R7303 Prediabetes: Secondary | ICD-10-CM

## 2015-02-14 MED ORDER — ATORVASTATIN CALCIUM 80 MG PO TABS: 80.0000 mg | ORAL_TABLET | Freq: Every day | ORAL | Status: DC

## 2015-02-14 NOTE — Telephone Encounter (Signed)
Will forward to MD to write script. Nathyn Luiz,CMA

## 2015-02-14 NOTE — Progress Notes (Signed)
Patient ID: Sarah [name], female   DOB: Apr 24, 1951, 64 y.o.   MRN: [id number] Subjective:   CC: Hospital follow-up, shoulder pain  HPI:   Hospital follow-up Patient was hospitalized at [facility] 4/22-23 for atypical chest pain, workup was negative. Her main complaint at hospital was right shoulder pain. Chest pain was thought to be musculoskeletal. Risk stratification labs were positive for prediabetic A1c at 5.9. Patient denies further episodes of chest pain, shortness of breath, or any dizziness, syncope, or other complaints. She has been taking daily aspirin, atorvastatin, and antihypertensives. She tries to watch what she eats, avoiding fried foods, eating baked fish, trying to eat more vegetables, and only having about 2 glasses of sweet beverage per week. She is a nonsmoker.   Right Shoulder pain Patient states this has been present since hospitalization at the end of April. She is unclear of the cause and denies any trauma or falls. Pain is worse at night especially if she lays on it. It is constant. It occasionally wakes her. She denies swelling, fevers, chills, or redness. It is anterior and extends to mid biceps area. It hurts to raise overhead. Massage and Tylenol helped. She denies weakness or neck pain.     Review of Systems - Per HPI.   PMH - chest pain (atypical), acquired foot deformity, gait instability, hld, htn, peripheral neurpathy, obesity, OA, left wrist pain Smoking status: nonsmoker     Objective:  Physical Exam BP 144/72 mmHg  Pulse 84  Temp(Src) 98.3 F (36.8 C) (Oral)  Ht 5\' 4"  (1.626 m)  Wt 214 lb (97.07 kg)  BMI 36.72 kg/m2 GEN:NAD Cardiovascular: Regular rate and rhythm, no murmurs rubs or gallops Pulmonary: Clear to auscultation bilaterally, normal effort Extremities: Left lower extremity in boot, right lower extremity with no erythema, pitting edema, or calf tenderness Right shoulder with abduction and forward flexion limited to 90, limited by  significant pain, no deformity visualized, tenderness markedly over bicipital groove and less so over remainder of anterior shoulder, positive speed's test, positive empty can and Hawkins test No C-spine tenderness or paraspinal tenderness, full neck ROM    Assessment:     Sarah [name] is a 64 y.o. female here for hospital follow-up for chest pain and right shoulder pain.    Plan:     # See problem list and after visit summary for problem-specific plans. -For patient's rollator walker request, asked her to inquire with advanced home care and if prescription is needed, I will be happy to provide.   Health Maintenance: discussed weight loss.   Follow-up in one month for follow-up of blood pressure, dietary recall, and right shoulder pain.   Hilton Sinclair, MD [place]

## 2015-02-14 NOTE — Patient Instructions (Addendum)
For your prior chest pain, I think this was musculoskeletal. Continue your aspirin, cholesterol medicine (atorvastatin only), and BP medicines. Follow up with me in 1 month to go over diet and blood pressure. In the meantime, check 3-4 BPs at home and write down everything you eat or drink including condiments in a typical 2 day period. Try to be active including walking 15-20 minutes daily.  For your right shoulder, we are getting an xray. This seems most likely biceps tendonitis from your exam but could also be rotator cuff partial tear. Do exercises I have printed. Avoid overhead activities. Continue tylenol and take breaks from meloxicam use.  Hilton Sinclair, MD

## 2015-02-14 NOTE — Telephone Encounter (Signed)
Pt called because [facility] will not give her a chair without the prescription from the doctor. She said that Dr. Darene Lamer would write this for her if they needed a prescription. Can we fax this to [facility]. jw

## 2015-02-14 NOTE — Assessment & Plan Note (Signed)
A1c 5.9. Per initial diet review, not eating many sweet beverages/carbs. - Dietary journal for a typical 1-2 days and f/u 1 month. - Increase to 15-20 min exercise daily.

## 2015-02-14 NOTE — Assessment & Plan Note (Signed)
Chest pain from recent hospitalization thought to be musculoskeletal. Patient denies further episodes. Managing risk. -Continue watching diet and asked patient to keep a dietary journal for a typical 1-2 days and keep a blood pressure log. -Follow-up in one month to review this, as goal is weight loss. At that time, can consider dietitian referral. -Urged walking 15-20 minutes daily, difficult for patient with foot deformity. -Continue Lipitor, metoprolol, lisinopril HCTZ, and aspirin 81 mg. -If chest pain symptoms recur, will refer to cardiology. -May in the future need workup for BP elevation on multiple antihypertensives.

## 2015-02-14 NOTE — Assessment & Plan Note (Signed)
Per exam, most likely biceps tendinitis. Partial rotator cuff tear is also possible given positive impingement testing. -Reviewed range of motion and strengthening exercises with her. Avoid overhead exercise / lifting. -Right shoulder x-ray to evaluate for arthritis. -Continue Tylenol. Trial heating pad. Take breaks from meloxicam to void renal injury or gastritis.  -Call in 2 weeks if exercises are not significantly helping with pain, and can trial amitriptyline 25 mg daily at bedtime. -Follow-up 1 month.

## 2015-02-15 NOTE — Telephone Encounter (Signed)
Left message for patient to call back. When she does, please clarify what she wants. At our visit, I thought she wanted a rollator walker (walker with 4 wheels). I don't think she qualifies for a wheelchair, though. Let me know, and I'll be happy to write the rx. Thank you,  Hilton Sinclair, MD

## 2015-02-15 NOTE — Telephone Encounter (Signed)
Spoke with patient and clarified that she does want the walker with 4 wheels.  She would like the one with a seat so she can seat when needed. Jazmin Hartsell,CMA

## 2015-02-17 NOTE — Telephone Encounter (Signed)
Rx printed and placed in bin for faxing to [facility].  Hilton Sinclair, MD

## 2015-03-03 ENCOUNTER — Encounter: Payer: Self-pay | Admitting: Family Medicine

## 2015-03-03 NOTE — Progress Notes (Signed)
Patient's daughter, Sarah [name], is dropping off a form for the patient to be completed for insurance purposes. It is basically to verify when and why she was admitted into the hospital recently. Please contact Mrs. Sarah [name] at [phone] once it is completed and ready for pick up. Thank you, Sarah [name], ASA

## 2015-03-03 NOTE — Progress Notes (Signed)
Paperwork placed in PCP box for completion. Elison Worrel, CMA.

## 2015-03-08 NOTE — Progress Notes (Signed)
Completed and placed in Tamika's box.  Hilton Sinclair, MD

## 2015-03-08 NOTE — Progress Notes (Signed)
Pt informed that form is complete and ready for pick up.  [place] L, RN  

## 2015-03-13 ENCOUNTER — Ambulatory Visit (INDEPENDENT_AMBULATORY_CARE_PROVIDER_SITE_OTHER): Payer: Medicare Other | Admitting: Family Medicine

## 2015-03-13 VITALS — BP 140/82 | HR 57 | Temp 98.2°F | Wt 213.1 lb

## 2015-03-13 DIAGNOSIS — E785 Hyperlipidemia, unspecified: Secondary | ICD-10-CM

## 2015-03-13 DIAGNOSIS — M25511 Pain in right shoulder: Secondary | ICD-10-CM

## 2015-03-13 MED ORDER — METHYLPREDNISOLONE ACETATE 40 MG/ML IJ SUSP
40.0000 mg | Freq: Once | INTRAMUSCULAR | Status: AC
  Administered 2015-03-13: 40 mg via INTRA_ARTICULAR

## 2015-03-13 MED ORDER — AMLODIPINE BESYLATE 10 MG PO TABS: 10.0000 mg | ORAL_TABLET | Freq: Every day | ORAL | Status: DC

## 2015-03-13 MED ORDER — ATORVASTATIN CALCIUM 80 MG PO TABS: 80.0000 mg | ORAL_TABLET | Freq: Every day | ORAL | Status: DC

## 2015-03-13 NOTE — Progress Notes (Signed)
Patient ID: Sarah [name], female   DOB: Feb 28, 1951, 64 y.o.   MRN: [id number] Subjective:   CC: Arm pain  HPI:   Right arm pain Patient present with arm pain. She has had right shoulder pain since hospitalization at the end of April. At that time, she had denied trauma or falls. Pain was worse at night when she laid on that arm. Pain is constant. Anterior shoulder extending to mid biceps. Pain with raising her arm overhead. Improved with massage and Tylenol. No weakness or neck pain reported. At last visit, thought to be biceps tendinitis with partial rotator cuff tear possible given positive impingement testing. Reviewed range of motion and strengthening exercises and asked to avoid overhead lifting. Right shoulder x-ray was never done by pt.  Since then, she states she still has nightly aching. Tylenol still helps and she takes daily. Does not take mobic daily. Tramadol BID until it ran out. She does exercises.  HLD States she takes her atorvastatin regularly and is due for refill. Denies any side effects/new myalgias.  Review of Systems - Per HPI.   PMH - acquired deformity of ankle and foot with history of polio, prediabetes, osteoarthritis of multiple sites, obesity, peripheral neuropathy, hypertension, hyperlipidemia, right shoulder pain and left wrist pain history, gait instability    Objective:  Physical Exam BP 140/82 mmHg  Pulse 57  Temp(Src) 98.2 F (36.8 C) (Oral)  Wt 213 lb 1 oz (96.645 kg) GEN: NAD CV: RRR, no m/r/g PULM: Normal effort EXTR: No neck ROM difficulty Right shoulder with no deformity, erythema, or swelling ROM limited to fwd flexion and abduction to about 90 degrees before significant pain Tenderness at bicipital groove and with positive Speeds testing Also positive empty can and Hawkins impingement testing    Procedure:  Injection of right subacromial Consent obtained and verified. Time-out conducted.. Noted no overlying erythema, induration, or other  signs of local infection. Skin prepped in a sterile fashion. Topical analgesic spray: Ethyl chloride. Completed without difficulty. Meds:depomedrol/lidocaine 1:3 ratio Pain immediately improved suggesting accurate placement of the medication. Advised to call if fevers/chills, erythema, induration, drainage, or persistent bleeding.   Assessment:     Sarah [name] is a 64 y.o. female here for right shoulder/arm pain.    Plan:     # See problem list and after visit summary for problem-specific plans. - Refilled norvasc and atorvastatin  # Health Maintenance: Not discused  Follow-up: Follow up in 2-4 weeks for right shoulder pain.  Hilton Sinclair, MD [place]

## 2015-03-13 NOTE — Patient Instructions (Signed)
You had an injection in your right shoulder today. Watch out for any signs of infection such as redness, warmth, swelling, fevers or chills, and get immediate care if that occurs. Follow-up in 2-4 weeks to see how you're doing. In the meantime continue doing the rehabilitation exercises we discussed and get the x-ray that I ordered. It was good to see you today.  Hilton Sinclair, MD

## 2015-03-14 ENCOUNTER — Ambulatory Visit (HOSPITAL_COMMUNITY)
Admission: RE | Admit: 2015-03-14 | Discharge: 2015-03-14 | Disposition: A | Discharge status: Home / Self Care | Payer: Medicare Other | Source: Ambulatory Visit | Attending: Family Medicine | Admitting: Family Medicine

## 2015-03-14 DIAGNOSIS — M8580 Other specified disorders of bone density and structure, unspecified site: Secondary | ICD-10-CM | POA: Insufficient documentation

## 2015-03-14 DIAGNOSIS — M25511 Pain in right shoulder: Secondary | ICD-10-CM

## 2015-03-17 NOTE — Assessment & Plan Note (Signed)
Taking statin regularly, lipid panel due 06/2015. -Refilled atorvastatin.

## 2015-03-17 NOTE — Assessment & Plan Note (Signed)
Right shoulder pain with radiation down arm, no numbness/tingling, neck ROM preserved. Exam consistent with biceps tendonitis and rotator cuff tear/tendinopathy. -Urged pt to get shoulder xray -Subacromial injection; monitor for infection. Daily tylenol, keep tramadol to minimum. -Rehab exercises (ROM, mostly) and avoid overhead heavy lifting -F/u 2-4 weeks.

## 2015-03-20 ENCOUNTER — Telehealth: Payer: Self-pay | Admitting: Family Medicine

## 2015-03-20 NOTE — Telephone Encounter (Signed)
Called to notify patient of xray results (glenohumeral and AC arthritis along with osteopenia). States injection helped with symptoms though still painful occasionally. Continue shoulder ROM exercises. She does not think she has ever had a DEXA scan. Plan to discuss at f/u.    Sarah Sinclair, MD

## 2015-03-30 ENCOUNTER — Other Ambulatory Visit: Payer: Self-pay | Admitting: Family Medicine

## 2015-03-30 NOTE — Telephone Encounter (Signed)
Follow up to discuss BP. Hilton Sinclair, MD

## 2015-05-01 ENCOUNTER — Ambulatory Visit (INDEPENDENT_AMBULATORY_CARE_PROVIDER_SITE_OTHER): Payer: Medicare Other | Admitting: Family Medicine

## 2015-05-01 VITALS — BP 144/74 | HR 59 | Temp 98.2°F | Wt 209.0 lb

## 2015-05-01 DIAGNOSIS — Z0000 Encounter for general adult medical examination without abnormal findings: Secondary | ICD-10-CM

## 2015-05-01 DIAGNOSIS — M6740 Ganglion, unspecified site: Secondary | ICD-10-CM

## 2015-05-01 NOTE — Assessment & Plan Note (Signed)
DIP joint of right middle finger. No signs or symptoms of infection/inflammation. No pain, however flexion at DIP joint is somewhat reduced. Offered aspiration today, however patient declined. Will refer to orthopedics.

## 2015-05-01 NOTE — Progress Notes (Signed)
    Subjective:  Sarah [name] is a 64 y.o. female who presents to the [facility] today with a chief complaint of right finger cyst and healthcare maintenance.   HPI:  Right Finger Cyst. Present for the past 2-3 weeks. Located on ulnar side of right middle finger. No pain, however limits flexion in finger. No redness. No fevers or chills. No obvious trauma or other precipitating event. Has not tried anything to reduce size.  ROS: No chest pain or shortness of breath, otherwise all systems reviewed and are negative   Objective:  Physical Exam: BP 144/74 mmHg  Pulse 59  Temp(Src) 98.2 F (36.8 C) (Oral)  Wt 209 lb (94.802 kg)  Gen: NAD, resting comfortably CV: RRR with no murmurs appreciated Lungs: NWOB, CTAB with no crackles, wheezes, or rhonchi GI: Normal bowel sounds present. Soft, Nontender, Nondistended. MSK: Approximately 57mm ganglion cyst noted on ulnar aspect of right DIP joint of the middle finger. No redness or erythema.  Skin: warm, dry Neuro: grossly normal, moves all extremities Psych: Normal affect and thought content  Assessment/Plan:  Ganglion cyst DIP joint of right middle finger. No signs or symptoms of infection/inflammation. No pain, however flexion at DIP joint is somewhat reduced. Offered aspiration today, however patient declined. Will refer to orthopedics.  Health care maintenance UTD on cervical, breast, and colon cancer screening. Will need repeat mammography in October. Next Pap in 2020.     Algis Greenhouse. Jerline Pain, [place] [facility] Resident PGY-2 05/01/2015 2:32 PM

## 2015-05-01 NOTE — Assessment & Plan Note (Signed)
UTD on cervical, breast, and colon cancer screening. Will need repeat mammography in October. Next Pap in 2020.

## 2015-05-01 NOTE — Patient Instructions (Addendum)
Thank you for coming to the clinic today. It was nice seeing you.  You did not need a pap smear today. You will need a repeat one in 2020.  You will need a mammogram in October.  For the cyst on your hand, this is called a ganglion cyst. We will refer you to orthopedics for further management.  Take care,  Dr Jerline Pain  Ganglion Cyst A ganglion cyst is a noncancerous, fluid-filled lump that occurs near joints or tendons. The ganglion cyst grows out of a joint or the lining of a tendon. It most often develops in the hand or wrist but can also develop in the shoulder, elbow, hip, knee, ankle, or foot. The round or oval ganglion can be pea sized or larger than a grape. Increased activity may enlarge the size of the cyst because more fluid starts to build up.  CAUSES  It is not completely known what causes a ganglion cyst to grow. However, it may be related to:  Inflammation or irritation around the joint.  An injury.  Repetitive movements or overuse.  Arthritis. SYMPTOMS  A lump most often appears in the hand or wrist, but can occur in other areas of the body. Generally, the lump is painless without other symptoms. However, sometimes pain can be felt during activity or when pressure is applied to the lump. The lump may even be tender to the touch. Tingling, pain, numbness, or muscle weakness can occur if the ganglion cyst presses on a nerve. Your grip may be weak and you may have less movement in your joints.  DIAGNOSIS  Ganglion cysts are most often diagnosed based on a physical exam, noting where the cyst is and how it looks. Your caregiver will feel the lump and may shine a light alongside it. If it is a ganglion, a light often shines through it. Your caregiver may order an X-ray, ultrasound, or MRI to rule out other conditions. TREATMENT  Ganglions usually go away on their own without treatment. If pain or other symptoms are involved, treatment may be needed. Treatment is also needed if  the ganglion limits your movement or if it gets infected. Treatment options include:  Wearing a wrist or finger brace or splint.  Taking anti-inflammatory medicine.  Draining fluid from the lump with a needle (aspiration).  Injecting a steroid into the joint.  Surgery to remove the ganglion cyst and its stalk that is attached to the joint or tendon. However, ganglion cysts can grow back. HOME CARE INSTRUCTIONS   Do not press on the ganglion, poke it with a needle, or hit it with a heavy object. You may rub the lump gently and often. Sometimes fluid moves out of the cyst.  Only take medicines as directed by your caregiver.  Wear your brace or splint as directed by your caregiver. SEEK MEDICAL CARE IF:   Your ganglion becomes larger or more painful.  You have increased redness, red streaks, or swelling.  You have pus coming from the lump.  You have weakness or numbness in the affected area. MAKE SURE YOU:   Understand these instructions.  Will watch your condition.  Will get help right away if you are not doing well or get worse. Document Released: 09/20/2000 Document Revised: 06/17/2012 Document Reviewed: 11/17/2007 [facility] Patient Information 2015 [place], [place]. This information is not intended to replace advice given to you by your health care provider. Make sure you discuss any questions you have with your health care provider.

## 2015-08-14 ENCOUNTER — Other Ambulatory Visit: Payer: Self-pay | Admitting: Family Medicine

## 2015-09-26 ENCOUNTER — Other Ambulatory Visit: Payer: Self-pay

## 2015-09-26 DIAGNOSIS — Z1231 Encounter for screening mammogram for malignant neoplasm of breast: Secondary | ICD-10-CM

## 2015-10-17 ENCOUNTER — Ambulatory Visit: Payer: Medicare Other

## 2015-11-02 ENCOUNTER — Ambulatory Visit
Admission: RE | Admit: 2015-11-02 | Discharge: 2015-11-02 | Disposition: A | Discharge status: Home / Self Care | Payer: Medicare Other | Source: Ambulatory Visit

## 2015-11-02 DIAGNOSIS — Z1231 Encounter for screening mammogram for malignant neoplasm of breast: Secondary | ICD-10-CM

## 2015-12-04 ENCOUNTER — Ambulatory Visit (INDEPENDENT_AMBULATORY_CARE_PROVIDER_SITE_OTHER): Payer: Medicare Other | Admitting: Sports Medicine

## 2015-12-04 ENCOUNTER — Encounter: Payer: Self-pay | Admitting: Sports Medicine

## 2015-12-04 VITALS — BP 157/82 | Ht 64.0 in | Wt 209.0 lb

## 2015-12-04 DIAGNOSIS — M79672 Pain in left foot: Secondary | ICD-10-CM

## 2015-12-04 NOTE — Progress Notes (Signed)
  CARLINDA TAPE - 65 y.o. female MRN [id number]  Date of birth: 1951/02/21  SUBJECTIVE:     65 year old female with long standing history of left foot/ankle pain secondary to deformity from polio presents for follow up.  At previous visit in July 2015 patient was given new air cast that she has been wearing since. Initially it helped a lot with her pain and improve her mobility but it has become broken down and no longer hold the air needed to provide cushioning. The lateral edge of the left foot has become very sore to touch and painful with walking even in the boot. She has no other complaints currently (other than chronic pain and weakness of the left foot and ankle).  She states that she is managing with the old cam walker but thinks a new one might help   ROS:     Per HPI  PERTINENT  PMH / [place] [facility] / / SH:  Past Medical, Surgical, Social History Reviewed.  Pertinent Historical Findings include: Hx of polio as a child which has caused foot drop and foot/ankle issues.  OBJECTIVE: BP 157/82 mmHg  Ht 5\' 4"  (1.626 m)  Wt 209 lb (94.802 kg)  BMI 35.86 kg/m2  Physical Exam:  Vital signs are reviewed. General: well appearing female; NAD. Left foot/ankle:  - Moderate to severe talipes equinovarus.  Marked supination noted.  No open skin lesions or signs of underlying infection.  - Active and passive plantar flexion and dorsiflexion very limited on exam. - Very tender to palpation of the lateral foot over significant callous - Plantarflexion and dorsiflexion strength 0-1/5.   ASSESSMENT & PLAN: Foot pain, left New cam walker to provided today, hopefully shorter for improved tolerability  This is a very difficult case given hx of polio.  She has seen Orthopedic specialist and no other interventions have been offered/tried.  As a result, patient will have to continue using cam walker to allow ambulation and to aid pain. Follow up as needed (periodically, if in need of new cam  walker).     Patient seen and evaluated with the resident. I agree with the above plan of care. Patient had an immediate improvement in her pain with her new Cam Walker in place.

## 2015-12-04 NOTE — Assessment & Plan Note (Signed)
New cam walker to provided today, hopefully shorter for improved tolerability  This is a very difficult case given hx of polio.  She has seen Orthopedic specialist and no other interventions have been offered/tried.  As a result, patient will have to continue using cam walker to allow ambulation and to aid pain. Follow up as needed (periodically, if in need of new cam walker).

## 2016-01-30 ENCOUNTER — Ambulatory Visit (INDEPENDENT_AMBULATORY_CARE_PROVIDER_SITE_OTHER): Payer: Medicare Other | Admitting: *Deleted

## 2016-01-30 ENCOUNTER — Encounter: Payer: Self-pay | Admitting: *Deleted

## 2016-01-30 VITALS — BP 156/74 | HR 64 | Temp 98.1°F | Ht 65.0 in | Wt 218.6 lb

## 2016-01-30 DIAGNOSIS — Z1159 Encounter for screening for other viral diseases: Secondary | ICD-10-CM | POA: Diagnosis not present

## 2016-01-30 DIAGNOSIS — Z0000 Encounter for general adult medical examination without abnormal findings: Secondary | ICD-10-CM | POA: Diagnosis not present

## 2016-01-30 MED ORDER — ZOSTAVAX 19400 UNT/0.65ML ~~LOC~~ SOLR: 0.6500 mL | Freq: Once | SUBCUTANEOUS | Status: DC

## 2016-01-30 NOTE — Progress Notes (Signed)
ADDENDUM:  I have reviewed this visit and discussed with Lauren Ducatte, RN, BSN, and agree with her documentation.  

## 2016-01-30 NOTE — Patient Instructions (Addendum)
Fall Prevention in the Home  Falls can cause injuries. They can happen to people of all ages. There are many things you can do to make your home safe and to help prevent falls.  WHAT CAN I DO ON THE OUTSIDE OF MY HOME?  Regularly fix the edges of walkways and driveways and fix any cracks.  Remove anything that might make you trip as you walk through a door, such as a raised step or threshold.  Trim any bushes or trees on the path to your home.  Use bright outdoor lighting.  Clear any walking paths of anything that might make someone trip, such as rocks or tools.  Regularly check to see if handrails are loose or broken. Make sure that both sides of any steps have handrails.  Any raised decks and porches should have guardrails on the edges.  Have any leaves, snow, or ice cleared regularly.  Use sand or salt on walking paths during winter.  Clean up any spills in your garage right away. This includes oil or grease spills. WHAT CAN I DO IN THE BATHROOM?   Use night lights.  Install grab bars by the toilet and in the tub and shower. Do not use towel bars as grab bars.  Use non-skid mats or decals in the tub or shower.  If you need to sit down in the shower, use a plastic, non-slip stool.  Keep the floor dry. Clean up any water that spills on the floor as soon as it happens.  Remove soap buildup in the tub or shower regularly.  Attach bath mats securely with double-sided non-slip rug tape.  Do not have throw rugs and other things on the floor that can make you trip. WHAT CAN I DO IN THE BEDROOM?  Use night lights.  Make sure that you have a light by your bed that is easy to reach.  Do not use any sheets or blankets that are too big for your bed. They should not hang down onto the floor.  Have a firm chair that has side arms. You can use this for support while you get dressed.  Do not have throw rugs and other things on the floor that can make you trip. WHAT CAN I DO IN  THE KITCHEN?  Clean up any spills right away.  Avoid walking on wet floors.  Keep items that you use a lot in easy-to-reach places.  If you need to reach something above you, use a strong step stool that has a grab bar.  Keep electrical cords out of the way.  Do not use floor polish or wax that makes floors slippery. If you must use wax, use non-skid floor wax.  Do not have throw rugs and other things on the floor that can make you trip. WHAT CAN I DO WITH MY STAIRS?  Do not leave any items on the stairs.  Make sure that there are handrails on both sides of the stairs and use them. Fix handrails that are broken or loose. Make sure that handrails are as long as the stairways.  Check any carpeting to make sure that it is firmly attached to the stairs. Fix any carpet that is loose or worn.  Avoid having throw rugs at the top or bottom of the stairs. If you do have throw rugs, attach them to the floor with carpet tape.  Make sure that you have a light switch at the top of the stairs and the bottom of the stairs.  stairs. If you do not have them, ask someone to add them for you. WHAT ELSE CAN I DO TO HELP PREVENT FALLS?  Wear shoes that:  Do not have high heels.  Have rubber bottoms.  Are comfortable and fit you well.  Are closed at the toe. Do not wear sandals.  If you use a stepladder:  Make sure that it is fully opened. Do not climb a closed stepladder.  Make sure that both sides of the stepladder are locked into place.  Ask someone to hold it for you, if possible.  Clearly mark and make sure that you can see:  Any grab bars or handrails.  First and last steps.  Where the edge of each step is.  Use tools that help you move around (mobility aids) if they are needed. These include:  Canes.  Walkers.  Scooters.  Crutches.  Turn on the lights when you go into a dark area. Replace any light bulbs as soon as they burn out.  Set up your furniture so you have a clear  path. Avoid moving your furniture around.  If any of your floors are uneven, fix them.  If there are any pets around you, be aware of where they are.  Review your medicines with your doctor. Some medicines can make you feel dizzy. This can increase your chance of falling. Ask your doctor what other things that you can do to help prevent falls.   This information is not intended to replace advice given to you by your health care provider. Make sure you discuss any questions you have with your health care provider.   Document Released: 07/20/2009 Document Revised: 02/07/2015 Document Reviewed: 10/28/2014 Elsevier Interactive Patient Education 2016 [employer]  Health Maintenance, Female Adopting a healthy lifestyle and getting preventive care can go a long way to promote health and wellness. Talk with your health care provider about what schedule of regular examinations is right for you. This is a good chance for you to check in with your provider about disease prevention and staying healthy. In between checkups, there are plenty of things you can do on your own. Experts have done a lot of research about which lifestyle changes and preventive measures are most likely to keep you healthy. Ask your health care provider for more information. WEIGHT AND DIET  Eat a healthy diet  Be sure to include plenty of vegetables, fruits, low-fat dairy products, and lean protein.  Do not eat a lot of foods high in solid fats, added sugars, or salt.  Get regular exercise. This is one of the most important things you can do for your health.  Most adults should exercise for at least 150 minutes each week. The exercise should increase your heart rate and make you sweat (moderate-intensity exercise).  Most adults should also do strengthening exercises at least twice a week. This is in addition to the moderate-intensity exercise.  Maintain a healthy weight  Body mass index (BMI) is a measurement that can  be used to identify possible weight problems. It estimates body fat based on height and weight. Your health care provider can help determine your BMI and help you achieve or maintain a healthy weight.  For females 20 years of age and older:   A BMI below 18.5 is considered underweight.  A BMI of 18.5 to 24.9 is normal.  A BMI of 25 to 29.9 is considered overweight.  A BMI of 30 and above is considered obese.  Watch levels   of cholesterol and blood lipids  You should start having your blood tested for lipids and cholesterol at 63 years of age, then have this test every 5 years.  You may need to have your cholesterol levels checked more often if:  Your lipid or cholesterol levels are high.  You are older than 63 years of age.  You are at high risk for heart disease.  CANCER SCREENING   Lung Cancer  Lung cancer screening is recommended for adults 55-80 years old who are at high risk for lung cancer because of a history of smoking.  A yearly low-dose CT scan of the lungs is recommended for people who:  Currently smoke.  Have quit within the past 15 years.  Have at least a 30-pack-year history of smoking. A pack year is smoking an average of one pack of cigarettes a day for 1 year.  Yearly screening should continue until it has been 15 years since you quit.  Yearly screening should stop if you develop a health problem that would prevent you from having lung cancer treatment.  Breast Cancer  Practice breast self-awareness. This means understanding how your breasts normally appear and feel.  It also means doing regular breast self-exams. Let your health care provider know about any changes, no matter how small.  If you are in your 20s or 30s, you should have a clinical breast exam (CBE) by a health care provider every 1-3 years as part of a regular health exam.  If you are 40 or older, have a CBE every year. Also consider having a breast X-ray (mammogram) every year.  If  you have a family history of breast cancer, talk to your health care provider about genetic screening.  If you are at high risk for breast cancer, talk to your health care provider about having an MRI and a mammogram every year.  Breast cancer gene (BRCA) assessment is recommended for women who have family members with BRCA-related cancers. BRCA-related cancers include:  Breast.  Ovarian.  Tubal.  Peritoneal cancers.  Results of the assessment will determine the need for genetic counseling and BRCA1 and BRCA2 testing. Cervical Cancer Your health care provider may recommend that you be screened regularly for cancer of the pelvic organs (ovaries, uterus, and vagina). This screening involves a pelvic examination, including checking for microscopic changes to the surface of your cervix (Pap test). You may be encouraged to have this screening done every 3 years, beginning at age 21.  For women ages 30-65, health care providers may recommend pelvic exams and Pap testing every 3 years, or they may recommend the Pap and pelvic exam, combined with testing for human papilloma virus (HPV), every 5 years. Some types of HPV increase your risk of cervical cancer. Testing for HPV may also be done on women of any age with unclear Pap test results.  Other health care providers may not recommend any screening for nonpregnant women who are considered low risk for pelvic cancer and who do not have symptoms. Ask your health care provider if a screening pelvic exam is right for you.  If you have had past treatment for cervical cancer or a condition that could lead to cancer, you need Pap tests and screening for cancer for at least 20 years after your treatment. If Pap tests have been discontinued, your risk factors (such as having a new sexual partner) need to be reassessed to determine if screening should resume. Some women have medical problems that increase the chance   getting cervical cancer. In these cases,  your health care provider may recommend more frequent screening and Pap tests. Colorectal Cancer  This type of cancer can be detected and often prevented.  Routine colorectal cancer screening usually begins at 63 years of age and continues through 63 years of age.  Your health care provider may recommend screening at an earlier age if you have risk factors for colon cancer.  Your health care provider may also recommend using home test kits to check for hidden blood in the stool.  A small camera at the end of a tube can be used to examine your colon directly (sigmoidoscopy or colonoscopy). This is done to check for the earliest forms of colorectal cancer.  Routine screening usually begins at age 37.  Direct examination of the colon should be repeated every 5-10 years through 63 years of age. However, you may need to be screened more often if early forms of precancerous polyps or small growths are found. Skin Cancer  Check your skin from head to toe regularly.  Tell your health care provider about any new moles or changes in moles, especially if there is a change in a mole's shape or color.  Also tell your health care provider if you have a mole that is larger than the size of a pencil eraser.  Always use sunscreen. Apply sunscreen liberally and repeatedly throughout the day.  Protect yourself by wearing long sleeves, pants, a wide-brimmed hat, and sunglasses whenever you are outside. HEART DISEASE, DIABETES, AND HIGH BLOOD PRESSURE   High blood pressure causes heart disease and increases the risk of stroke. High blood pressure is more likely to develop in:  People who have blood pressure in the high end of the normal range (130-139/85-89 mm Hg).  People who are overweight or obese.  People who are African American.  If you are 107-70 years of age, have your blood pressure checked every 3-5 years. If you are 35 years of age or older, have your blood pressure checked every year. You  should have your blood pressure measured twice--once when you are at a hospital or clinic, and once when you are not at a hospital or clinic. Record the average of the two measurements. To check your blood pressure when you are not at a hospital or clinic, you can use:  An automated blood pressure machine at a pharmacy.  A home blood pressure monitor.  If you are between 72 years and 52 years old, ask your health care provider if you should take aspirin to prevent strokes.  Have regular diabetes screenings. This involves taking a blood sample to check your fasting blood sugar level.  If you are at a normal weight and have a low risk for diabetes, have this test once every three years after 63 years of age.  If you are overweight and have a high risk for diabetes, consider being tested at a younger age or more often. PREVENTING INFECTION  Hepatitis B  If you have a higher risk for hepatitis B, you should be screened for this virus. You are considered at high risk for hepatitis B if:  You were born in a country where hepatitis B is common. Ask your health care provider which countries are considered high risk.  Your parents were born in a high-risk country, and you have not been immunized against hepatitis B (hepatitis B vaccine).  You have HIV or AIDS.  You use needles to inject street drugs.  You live  with someone who has hepatitis B.  You have had sex with someone who has hepatitis B.  You get hemodialysis treatment.  You take certain medicines for conditions, including cancer, organ transplantation, and autoimmune conditions. Hepatitis C  Blood testing is recommended for:  Everyone born from [birth year] through 1963.  Anyone with known risk factors for hepatitis C. Sexually transmitted infections (STIs)  You should be screened for sexually transmitted infections (STIs) including gonorrhea and chlamydia if:  You are sexually active and are younger than 63 years of age.  You  are older than 63 years of age and your health care provider tells you that you are at risk for this type of infection.  Your sexual activity has changed since you were last screened and you are at an increased risk for chlamydia or gonorrhea. Ask your health care provider if you are at risk.  If you do not have HIV, but are at risk, it may be recommended that you take a prescription medicine daily to prevent HIV infection. This is called pre-exposure prophylaxis (PrEP). You are considered at risk if:  You are sexually active and do not regularly use condoms or know the HIV status of your partner(s).  You take drugs by injection.  You are sexually active with a partner who has HIV. Talk with your health care provider about whether you are at high risk of being infected with HIV. If you choose to begin PrEP, you should first be tested for HIV. You should then be tested every 3 months for as long as you are taking PrEP.  PREGNANCY   If you are premenopausal and you may become pregnant, ask your health care provider about preconception counseling.  If you may become pregnant, take 400 to 800 micrograms (mcg) of folic acid every day.  If you want to prevent pregnancy, talk to your health care provider about birth control (contraception). OSTEOPOROSIS AND MENOPAUSE   Osteoporosis is a disease in which the bones lose minerals and strength with aging. This can result in serious bone fractures. Your risk for osteoporosis can be identified using a bone density scan.  If you are 49 years of age or older, or if you are at risk for osteoporosis and fractures, ask your health care provider if you should be screened.  Ask your health care provider whether you should take a calcium or vitamin D supplement to lower your risk for osteoporosis.  Menopause may have certain physical symptoms and risks.  Hormone replacement therapy may reduce some of these symptoms and risks. Talk to your health care  provider about whether hormone replacement therapy is right for you.  HOME CARE INSTRUCTIONS   Schedule regular health, dental, and eye exams.  Stay current with your immunizations.   Do not use any tobacco products including cigarettes, chewing tobacco, or electronic cigarettes.  If you are pregnant, do not drink alcohol.  If you are breastfeeding, limit how much and how often you drink alcohol.  Limit alcohol intake to no more than 1 drink per day for nonpregnant women. One drink equals 12 ounces of beer, 5 ounces of wine, or 1 ounces of hard liquor.  Do not use street drugs.  Do not share needles.  Ask your health care provider for help if you need support or information about quitting drugs.  Tell your health care provider if you often feel depressed.  Tell your health care provider if you have ever been abused or do not feel safe at  home.   This information is not intended to replace advice given to you by your health care provider. Make sure you discuss any questions you have with your health care provider.   Document Released: 04/08/2011 Document Revised: 10/14/2014 Document Reviewed: 08/25/2013 Elsevier Interactive Patient Education 2016 [place] Medicare initial physical female patient instructions here.

## 2016-01-30 NOTE — Progress Notes (Signed)
Subjective:   Sarah [name] is a 65 y.o. female who presents for an Initial Medicare Annual Wellness Visit.       Objective:    Today's Vitals   01/30/16 1347 01/30/16 1348  BP:  156/74  Pulse:  64  Temp:  98.1 F (36.7 C)  TempSrc:  Oral  Height: 5\' 5"  (1.651 m)   Weight: 218 lb 9.6 oz (99.156 kg)   SpO2:  98%  PainSc:  7   PainLoc:  Shoulder   Body mass index is 36.38 kg/(m^2).  Patient missed AM dose of amlodipine BP 156/74  Current Medications (verified) Outpatient Encounter Prescriptions as of 01/30/2016  Medication Sig  . amLODipine (NORVASC) 10 MG tablet Take 1 tablet (10 mg total) by mouth daily.  Marland Kitchen aspirin 81 MG EC tablet Take 81 mg by mouth daily.    Marland Kitchen atorvastatin (LIPITOR) 80 MG tablet Take 1 tablet (80 mg total) by mouth daily.  . Calcium Carbonate-Vitamin D (CALTRATE 600+D) 600-400 MG-UNIT per tablet Take 1 tablet by mouth 2 (two) times daily.    Marland Kitchen lisinopril-hydrochlorothiazide (PRINZIDE,ZESTORETIC) 20-12.5 MG per tablet Take 2 tablets by mouth daily.  . meloxicam (MOBIC) 15 MG tablet Take 15 mg by mouth daily.  . metoprolol tartrate (LOPRESSOR) 25 MG tablet Take 1 tablet (25 mg total) by mouth 2 (two) times daily. Need appt to discuss BP.  Marland Kitchen spironolactone (ALDACTONE) 25 MG tablet Take 1 tablet (25 mg total) by mouth daily. Need appt to discuss BP.  . traMADol (ULTRAM) 50 MG tablet Take 1 tablet (50 mg total) by mouth every 12 (twelve) hours as needed for moderate pain.   No facility-administered encounter medications on file as of 01/30/2016.    Allergies (verified) Penicillins   History: Past Medical History  Diagnosis Date  . Hypertension   . Arthritis    No past surgical history on file. No family history on file. Social History   Occupational History  . Not on file.   Social History Main Topics  . Smoking status: Never Smoker   . Smokeless tobacco: Not on file  . Alcohol Use: No  . Drug Use: Not on file  . Sexual Activity: Not on file      Tobacco Counseling Patient has never smoked and doesn't plan to start.  Activities of Daily Living In your present state of health, do you have any difficulty performing the following activities: 01/30/2016  Hearing? N  Vision? Y  Difficulty concentrating or making decisions? N  Walking or climbing stairs? Y  Dressing or bathing? N  Doing errands, shopping? N   Tug Test: 19 seconds. Patient wears boot left foot. Patient usually walks with cane but left it in car. Used both hands to push out of chair and to sit back down. Had a slow tentative pace, held onto exam table to walk across floor. Discussed fall prevention in the home and patient given literature on fall prevention.  Immunizations and Health Maintenance Immunization History  Administered Date(s) Administered  . Influenza Whole 07/27/2007  . Td 02/04/1990   Health Maintenance Due  Topic Date Due  . Hepatitis C Screening  07/02/1951  . TETANUS/TDAP  02/05/2000  . ZOSTAVAX  05/01/2013   Rx given to patient for Zostavax and TDaP to be given at local pharmacy.  Patient Care Team: Vivi Barrack, MD as PCP - General (Family Medicine)  Indicate any recent Medical Services you may have received from other than [facility] providers in the past  year (date may be approximate). None    Assessment:   This is a routine wellness examination for Sarah [name].   Hearing/Vision screen  Hearing Screening   125Hz  250Hz  500Hz  1000Hz  2000Hz  4000Hz  8000Hz   Right ear:   25 25 25 25    Left ear:   25 25 25 25      Dietary issues and exercise activities discussed: Current Exercise Habits: Home exercise routine, Type of exercise: walking (stationary bike), Time (Minutes): 10, Frequency (Times/Week): 3, Weekly Exercise (Minutes/Week): 30, Intensity: Mild, Exercise limited by: orthopedic condition(s)  Goals    . Blood Pressure < 150/90    . Weight < 207 lb (93.895 kg)     5% weight loss     Increase moderate physical activity to 150 minutes per  week  Depression Screen PHQ 2/9 Scores 01/30/2016 12/04/2015 03/13/2015 02/14/2015 06/30/2014 03/11/2014  PHQ - 2 Score 0 0 0 0 0 0  Exception Documentation - Other- indicate reason in comment box - - - -    Fall Risk Fall Risk  01/30/2016 12/04/2015  Falls in the past year? Yes No  Number falls in past yr: 2 or more -  Injury with Fall? Yes -  Risk Factor Category  High Fall Risk -  Risk for fall due to : History of fall(s);Impaired balance/gait;Impaired mobility Other (Comment)  Follow up Falls prevention discussed -    Cognitive Function: Mini-Cog: passed with score of 3  Screening Tests Health Maintenance  Topic Date Due  . Hepatitis C Screening  11-07-1950  . TETANUS/TDAP  02/05/2000  . ZOSTAVAX  05/01/2013  . INFLUENZA VACCINE  05/07/2016  . PAP SMEAR  06/30/2017  . MAMMOGRAM  11/01/2017  . COLONOSCOPY  06/08/2023  . HIV Screening  Completed      Plan:   Hep C Antibody Patient will go to pharmacy for TDaP and Zostavax  During the course of the visit, Jadiah was educated and counseled about the following appropriate screening and preventive services:   Vaccines to include Influenza, Td, Zostavax  Colorectal cancer screening  Bone density screening  Mammography/PAP  Nutrition counseling  Patient Instructions (the written plan) were given to the patient.    Velora Heckler, RN   01/30/2016

## 2016-01-31 LAB — HEPATITIS C ANTIBODY: HCV Ab: NEGATIVE

## 2016-02-01 ENCOUNTER — Encounter: Payer: Self-pay | Admitting: Family Medicine

## 2016-02-06 ENCOUNTER — Telehealth: Payer: Self-pay | Admitting: *Deleted

## 2016-02-06 NOTE — Telephone Encounter (Signed)
Followed up with patient regarding last week's Annual Wellness Visit. Patient notified of Hep C negative result Patient states she received Zostavax and TDaP at [place] at  [facility] on 01/31/16 Added to Historical Immunization. Velora Heckler, RN

## 2016-02-07 NOTE — Addendum Note (Signed)
Addended by: Rockie Neighbours on: 02/07/2016 03:16 PM   Modules accepted: Level of Service

## 2016-02-27 ENCOUNTER — Encounter: Payer: Self-pay | Admitting: Family Medicine

## 2016-02-27 ENCOUNTER — Ambulatory Visit (INDEPENDENT_AMBULATORY_CARE_PROVIDER_SITE_OTHER): Payer: Medicare Other | Admitting: Family Medicine

## 2016-02-27 VITALS — BP 136/82 | HR 55 | Temp 98.2°F | Ht 65.0 in | Wt 214.4 lb

## 2016-02-27 DIAGNOSIS — R7309 Other abnormal glucose: Secondary | ICD-10-CM | POA: Diagnosis not present

## 2016-02-27 DIAGNOSIS — R7303 Prediabetes: Secondary | ICD-10-CM | POA: Diagnosis not present

## 2016-02-27 DIAGNOSIS — E785 Hyperlipidemia, unspecified: Secondary | ICD-10-CM

## 2016-02-27 DIAGNOSIS — M25511 Pain in right shoulder: Secondary | ICD-10-CM | POA: Diagnosis not present

## 2016-02-27 DIAGNOSIS — I10 Essential (primary) hypertension: Secondary | ICD-10-CM | POA: Diagnosis not present

## 2016-02-27 DIAGNOSIS — M159 Polyosteoarthritis, unspecified: Secondary | ICD-10-CM

## 2016-02-27 LAB — COMPLETE METABOLIC PANEL WITH GFR
ALT: 18 U/L (ref 6–29)
AST: 18 U/L (ref 10–35)
Albumin: 3.9 g/dL (ref 3.6–5.1)
Alkaline Phosphatase: 79 U/L (ref 33–130)
BUN: 11 mg/dL (ref 7–25)
CO2: 27 mmol/L (ref 20–31)
Calcium: 9.3 mg/dL (ref 8.6–10.4)
Chloride: 103 mmol/L (ref 98–110)
Creat: 0.76 mg/dL (ref 0.50–0.99)
GFR, Est African American: >89 mL/min (ref >=60)
GFR, Est Non African American: 84 mL/min (ref >=60)
Glucose, Bld: 126 mg/dL — ABNORMAL HIGH (ref 65–99)
Potassium: 4.1 mmol/L (ref 3.5–5.3)
Sodium: 140 mmol/L (ref 135–146)
Total Bilirubin: 0.8 mg/dL (ref 0.2–1.2)
Total Protein: 6.8 g/dL (ref 6.1–8.1)

## 2016-02-27 LAB — CBC
HCT: 39.6 % (ref 35.0–45.0)
Hemoglobin: 13 g/dL (ref 11.7–15.5)
MCH: 30.4 pg (ref 27.0–33.0)
MCHC: 32.8 g/dL (ref 32.0–36.0)
MCV: 92.7 fL (ref 80.0–100.0)
MPV: 11.7 fL (ref 7.5–12.5)
Platelets: 227 10*3/uL (ref 140–400)
RBC: 4.27 MIL/uL (ref 3.80–5.10)
RDW: 12.9 % (ref 11.0–15.0)
WBC: 5.1 10*3/uL (ref 3.8–10.8)

## 2016-02-27 LAB — LIPID PANEL
Cholesterol: 134 mg/dL (ref 125–200)
HDL: 35 mg/dL — ABNORMAL LOW (ref >=46)
LDL Cholesterol: 83 mg/dL (ref <130)
Total CHOL/HDL Ratio: 3.8 Ratio (ref <=5.0)
Triglycerides: 80 mg/dL (ref <150)
VLDL: 16 mg/dL (ref <30)

## 2016-02-27 MED ORDER — AMLODIPINE BESYLATE 10 MG PO TABS: 10.0000 mg | ORAL_TABLET | Freq: Every day | ORAL | Status: DC

## 2016-02-27 MED ORDER — TRAMADOL HCL 50 MG PO TABS: 50.0000 mg | ORAL_TABLET | Freq: Two times a day (BID) | ORAL | Status: DC | PRN

## 2016-02-27 NOTE — Progress Notes (Signed)
    Subjective:  Sarah [name] is a 65 y.o. female who presents to the [facility] today with a chief complaint of hand a should pain.   HPI:  Left Hand and Right Shoulder Pain. Chronic pain secondary to arthritis. A little worse over the past few days after running out of tramadol. Takes tylenol, meloxicam, and tramadol which all help with the pain. No fevers or chills. Left wrist is a little more swollen.   Hypertension BP Readings from Last 3 Encounters:  02/27/16 136/82  01/30/16 156/74  12/04/15 157/82   Home BP monitoring-No Compliant with medications-yes, without side effects ROS-Denies any CP, HA, SOB, blurry vision, LE edema, transient weakness, orthopnea, PND.   HLD Tolerating atorvastatin without side effects.  ROS: Per HPI  PMH: Smoking history reviewed.     Objective:  Physical Exam: BP 136/82 mmHg  Pulse 55  Temp(Src) 98.2 F (36.8 C) (Oral)  Ht 5\' 5"  (1.651 m)  Wt 214 lb 6.4 oz (97.251 kg)  BMI 35.68 kg/m2  Gen: NAD, resting comfortably CV: RRR with no murmurs appreciated Pulm: NWOB, CTAB with no crackles, wheezes, or rhonchi GI: Normal bowel sounds present. Soft, Nontender, Nondistended. MSK:  - Left Hand: Small effusion at first MCP joint. Tender to palpation. FROM. - Right shoulder: No deformities or effusions. Tender to palpation. FROM. Painful with passive and active range of motion. Strength 5/5 in all directions. Skin: warm, dry Neuro: grossly normal, moves all extremities Psych: Normal affect and thought content  Assessment/Plan:  Osteoarthritis Stable. Refilled tramadol. Encouraged patient to use tylenol up to 3000mg  a day. Continue meloxicam as needed. Consider scheduled tylenol if pain control worsens. May benefit from intra-articular glenohumeral injection - would need referral to sports med or ortho. Patient deferred today.   HYPERTENSION, BENIGN SYSTEMIC Elevated today. Continue current medications. Recheck in nurse clinic in 1-2 weeks. If still  elevated, will need increased treatment.   HLD (hyperlipidemia) Continue atorvastatin. Check lipid panel today.   Prediabetes Check A1c today.   Algis Greenhouse. Jerline Pain, [place] [facility] Resident PGY-2 02/27/2016 4:37 PM

## 2016-02-27 NOTE — Patient Instructions (Signed)
We will not make any changes to your medications today.  We will check blood work today.   We will refill your tramadol today. Please continue using the tylenol and mobic as needed. We can give you an injection if you are not getting better.  Your blood pressure was a little high today. Please come back in 1-2 weeks for a nurse visit to recheck.  Please come back to see me in 3 months.  Take care,  Dr Jerline Pain

## 2016-02-27 NOTE — Assessment & Plan Note (Signed)
Check A1c today.

## 2016-02-27 NOTE — Assessment & Plan Note (Signed)
Elevated today. Continue current medications. Recheck in nurse clinic in 1-2 weeks. If still elevated, will need increased treatment.

## 2016-02-27 NOTE — Assessment & Plan Note (Addendum)
Stable. Refilled tramadol. Encouraged patient to use tylenol up to 3000mg  a day. Continue meloxicam as needed. Consider scheduled tylenol if pain control worsens. May benefit from intra-articular glenohumeral injection - would need referral to sports med or ortho. Patient deferred today.

## 2016-02-27 NOTE — Assessment & Plan Note (Signed)
Continue atorvastatin. Check lipid panel today.  

## 2016-02-28 LAB — HEMOGLOBIN A1C
Hgb A1c MFr Bld: 6.3 % — ABNORMAL HIGH (ref <5.7)
Mean Plasma Glucose: 134 mg/dL

## 2016-02-29 ENCOUNTER — Encounter: Payer: Self-pay | Admitting: Family Medicine

## 2016-03-08 ENCOUNTER — Telehealth: Payer: Self-pay | Admitting: *Deleted

## 2016-03-08 ENCOUNTER — Ambulatory Visit (INDEPENDENT_AMBULATORY_CARE_PROVIDER_SITE_OTHER): Payer: Medicare Other | Admitting: *Deleted

## 2016-03-08 VITALS — BP 160/90 | HR 60

## 2016-03-08 DIAGNOSIS — Z136 Encounter for screening for cardiovascular disorders: Secondary | ICD-10-CM

## 2016-03-08 DIAGNOSIS — Z0130 Encounter for examination of blood pressure without abnormal findings: Secondary | ICD-10-CM

## 2016-03-08 DIAGNOSIS — I10 Essential (primary) hypertension: Secondary | ICD-10-CM

## 2016-03-08 NOTE — Progress Notes (Signed)
   Patient in nurse clinic for blood pressure check.  Patient denies any chest pain, SOB, headache, dizziness. Patient informed nurse that her blood pressure could high today due to eating a lot of salty foods at a birthday party yesterday.  Patient is taking her medications as prescribed.  Medication was taken at 8:30 AM this morning.  Advised patient to watch her salt intake.  Will forward to PCP. Derl Barrow, RN   Today's Vitals   03/08/16 0924 03/08/16 0933  BP: 178/100 160/90  Pulse: 60 60  SpO2: 97%   PainSc: 0-No pain

## 2016-03-08 NOTE — Telephone Encounter (Signed)
LM for patient to call back and schedule an appt for her blood pressure. Nadie Fiumara,CMA

## 2016-03-08 NOTE — Telephone Encounter (Signed)
-----   Message from Vivi Barrack, MD sent at 03/08/2016 10:07 AM EDT ----- Can you ask her to schedule an appointment soon to discuss her BP? Thanks! -Dimas Chyle  ----- Message -----    From: Derl Barrow, RN    Sent: 03/08/2016   9:35 AM      To: Vivi Barrack, MD

## 2016-03-18 DIAGNOSIS — I10 Essential (primary) hypertension: Secondary | ICD-10-CM | POA: Diagnosis not present

## 2016-03-18 DIAGNOSIS — E119 Type 2 diabetes mellitus without complications: Secondary | ICD-10-CM | POA: Diagnosis not present

## 2016-03-18 DIAGNOSIS — I872 Venous insufficiency (chronic) (peripheral): Secondary | ICD-10-CM | POA: Diagnosis not present

## 2016-03-22 ENCOUNTER — Encounter: Payer: Self-pay | Admitting: Family Medicine

## 2016-03-22 ENCOUNTER — Ambulatory Visit (INDEPENDENT_AMBULATORY_CARE_PROVIDER_SITE_OTHER): Payer: Medicare Other | Admitting: Family Medicine

## 2016-03-22 VITALS — BP 131/78 | HR 59 | Temp 98.4°F | Wt 210.0 lb

## 2016-03-22 DIAGNOSIS — M159 Polyosteoarthritis, unspecified: Secondary | ICD-10-CM

## 2016-03-22 DIAGNOSIS — I10 Essential (primary) hypertension: Secondary | ICD-10-CM

## 2016-03-22 MED ORDER — ACETAMINOPHEN 500 MG PO TABS: 1000.0000 mg | ORAL_TABLET | Freq: Three times a day (TID) | ORAL | Status: DC

## 2016-03-22 MED ORDER — NAPROXEN 500 MG PO TABS: 500.0000 mg | ORAL_TABLET | Freq: Two times a day (BID) | ORAL | Status: DC | PRN

## 2016-03-22 NOTE — Assessment & Plan Note (Signed)
Well controlled today. Continue current medications 

## 2016-03-22 NOTE — Assessment & Plan Note (Signed)
Worse since last visit. Again deferred injections. Start scheduled tyleonol 1000mg  three times daily. Start naproxyn 500mg  twice daily as needed. Continue tramadol 50mg  two to three times a day as needed. If pain continues to worsen, consider topical treatment with voltaren gel or capsacin cream. Consider referral to sports medicine for intra-articular injection.

## 2016-03-22 NOTE — Patient Instructions (Signed)
Your blood pressure looks good today. Will not make any changes.  For your arthritis, please take 1000mg  of tylenol three times a day. You can take naproxen 500mg  twice a day as needed.  You can take tramadol 50mg  three times a day as needed.  Please come back in 2-3 months for your next follow up, or sooner if you need anything else.   Take care,  Dr Jerline Pain

## 2016-03-22 NOTE — Progress Notes (Signed)
   Subjective:  Sarah [name] is a 65 y.o. female who presents to the [facility] today with a chief complaint of HTN follow up.   HPI:  Hypertension BP Readings from Last 3 Encounters:  03/22/16 131/78  03/08/16 160/90  02/27/16 136/82   Home BP monitoring-Yes Compliant with medications-yes, without side effects ROS-Denies any CP, HA, SOB, blurry vision, LE edema, transient weakness, orthopnea, PND.   Right shoulder Pain Chronic pain secondary to osteoarthritis. Pain is a little worse since her last visit. Is taking tylenol, meloxicam, and tramadol without significant side effects Not sure if meloxicam is helping. Patient not interested in injections today.   ROS: Per HPI  Objective:  Physical Exam: BP 131/78 mmHg  Pulse 59  Temp(Src) 98.4 F (36.9 C) (Oral)  Wt 210 lb (95.255 kg)  SpO2 98%  Gen: NAD, resting comfortably CV: RRR with no murmurs appreciated Pulm: NWOB, CTAB with no crackles, wheezes, or rhonchi MSK:  - Right shoulder: No deformities or effusions. Tender to palpation. FROM. Painful with passive and active range of motion. Strength 5/5 in all directions. Skin: warm, dry Neuro: grossly normal, moves all extremities Psych: Normal affect and thought content  Assessment/Plan:  HYPERTENSION, BENIGN SYSTEMIC Well controlled today. Continue current medications.   Osteoarthritis Worse since last visit. Again deferred injections. Start scheduled tyleonol 1000mg  three times daily. Start naproxyn 500mg  twice daily as needed. Continue tramadol 50mg  two to three times a day as needed. If pain continues to worsen, consider topical treatment with voltaren gel or capsacin cream. Consider referral to sports medicine for intra-articular injection.   Algis Greenhouse. Jerline Pain, [place] [facility] Resident PGY-2 03/22/2016 3:55 PM

## 2016-05-03 DIAGNOSIS — R7303 Prediabetes: Secondary | ICD-10-CM | POA: Diagnosis not present

## 2016-05-03 DIAGNOSIS — M79671 Pain in right foot: Secondary | ICD-10-CM | POA: Diagnosis not present

## 2016-05-03 DIAGNOSIS — Z8612 Personal history of poliomyelitis: Secondary | ICD-10-CM | POA: Diagnosis not present

## 2016-05-03 DIAGNOSIS — I10 Essential (primary) hypertension: Secondary | ICD-10-CM | POA: Diagnosis not present

## 2016-05-09 DIAGNOSIS — Q660 Congenital talipes equinovarus: Secondary | ICD-10-CM | POA: Diagnosis not present

## 2016-05-26 DIAGNOSIS — I10 Essential (primary) hypertension: Secondary | ICD-10-CM | POA: Diagnosis not present

## 2016-07-09 DIAGNOSIS — I10 Essential (primary) hypertension: Secondary | ICD-10-CM | POA: Diagnosis not present

## 2016-07-09 DIAGNOSIS — M79672 Pain in left foot: Secondary | ICD-10-CM | POA: Diagnosis not present

## 2016-07-11 DIAGNOSIS — Q660 Congenital talipes equinovarus: Secondary | ICD-10-CM | POA: Diagnosis not present

## 2016-07-29 DIAGNOSIS — Q6689 Other  specified congenital deformities of feet: Secondary | ICD-10-CM | POA: Diagnosis not present

## 2016-07-29 DIAGNOSIS — M216X2 Other acquired deformities of left foot: Secondary | ICD-10-CM | POA: Diagnosis not present

## 2016-07-29 DIAGNOSIS — Z9889 Other specified postprocedural states: Secondary | ICD-10-CM | POA: Diagnosis not present

## 2016-07-29 DIAGNOSIS — M6701 Short Achilles tendon (acquired), right ankle: Secondary | ICD-10-CM | POA: Diagnosis not present

## 2016-08-01 DIAGNOSIS — T63444A Toxic effect of venom of bees, undetermined, initial encounter: Secondary | ICD-10-CM | POA: Diagnosis not present

## 2016-08-01 DIAGNOSIS — E785 Hyperlipidemia, unspecified: Secondary | ICD-10-CM | POA: Diagnosis not present

## 2016-10-04 ENCOUNTER — Other Ambulatory Visit: Payer: Self-pay | Admitting: Family Medicine

## 2016-10-04 DIAGNOSIS — Z1231 Encounter for screening mammogram for malignant neoplasm of breast: Secondary | ICD-10-CM

## 2016-10-15 DIAGNOSIS — M7552 Bursitis of left shoulder: Secondary | ICD-10-CM | POA: Diagnosis not present

## 2016-10-15 DIAGNOSIS — I10 Essential (primary) hypertension: Secondary | ICD-10-CM | POA: Diagnosis not present

## 2016-10-15 DIAGNOSIS — R0600 Dyspnea, unspecified: Secondary | ICD-10-CM | POA: Diagnosis not present

## 2016-11-04 ENCOUNTER — Ambulatory Visit
Admission: RE | Admit: 2016-11-04 | Discharge: 2016-11-04 | Disposition: A | Discharge status: Home / Self Care | Payer: Medicare Other | Source: Ambulatory Visit | Attending: Family Medicine | Admitting: Family Medicine

## 2016-11-04 DIAGNOSIS — Z1231 Encounter for screening mammogram for malignant neoplasm of breast: Secondary | ICD-10-CM

## 2016-12-03 DIAGNOSIS — I10 Essential (primary) hypertension: Secondary | ICD-10-CM | POA: Diagnosis not present

## 2016-12-03 DIAGNOSIS — M79671 Pain in right foot: Secondary | ICD-10-CM | POA: Diagnosis not present

## 2016-12-03 DIAGNOSIS — M25519 Pain in unspecified shoulder: Secondary | ICD-10-CM | POA: Diagnosis not present

## 2016-12-03 DIAGNOSIS — M25561 Pain in right knee: Secondary | ICD-10-CM | POA: Diagnosis not present

## 2017-01-27 DIAGNOSIS — I10 Essential (primary) hypertension: Secondary | ICD-10-CM | POA: Diagnosis not present

## 2017-01-27 DIAGNOSIS — M1990 Unspecified osteoarthritis, unspecified site: Secondary | ICD-10-CM | POA: Diagnosis not present

## 2017-01-27 DIAGNOSIS — Z029 Encounter for administrative examinations, unspecified: Secondary | ICD-10-CM | POA: Diagnosis not present

## 2017-04-08 ENCOUNTER — Encounter: Payer: Self-pay | Admitting: *Deleted

## 2017-04-08 ENCOUNTER — Ambulatory Visit (INDEPENDENT_AMBULATORY_CARE_PROVIDER_SITE_OTHER): Payer: Medicare Other | Admitting: *Deleted

## 2017-04-08 VITALS — BP 152/82 | HR 56 | Temp 97.8°F | Ht 65.0 in | Wt 199.6 lb

## 2017-04-08 DIAGNOSIS — R7303 Prediabetes: Secondary | ICD-10-CM

## 2017-04-08 DIAGNOSIS — Z0000 Encounter for general adult medical examination without abnormal findings: Secondary | ICD-10-CM | POA: Diagnosis not present

## 2017-04-08 LAB — POCT GLYCOSYLATED HEMOGLOBIN (HGB A1C): Hemoglobin A1C: 5.8

## 2017-04-08 NOTE — Progress Notes (Signed)
I have reviewed this visit and discussed with Howell Rucks, RN, BSN, and agree with her documentation.   Marjie Skiff, MD Family Medicine PGY-2

## 2017-04-08 NOTE — Progress Notes (Signed)
Subjective:   Sarah [name] is a 66 y.o. female who presents for Medicare Annual (Subsequent) preventive examination.  Cardiac Risk Factors include: dyslipidemia;hypertension;sedentary lifestyle;obesity (BMI >30kg/m2)     Objective:     Vitals: BP (!) 146/90 (BP Location: Left Arm, Patient Position: Sitting, Cuff Size: Normal)   Pulse (!) 56   Temp 97.8 F (36.6 C) (Oral)   Ht 5\' 5"  (1.651 m)   Wt 199 lb 9.6 oz (90.5 kg)   SpO2 98%   BMI 33.22 kg/m   Body mass index is 33.22 kg/m.  BP recheck 152/82 left arm manually with adult cuff Patient states she was rushing this AM and has not yet  taken BP meds. Will take as soon as she arrives home.   Tobacco History  Smoking Status  . Never Smoker  Smokeless Tobacco  . Never Used     Patient has never smoked and has no plans to start.   Past Medical History:  Diagnosis Date  . Arthritis   . Hyperlipidemia   . Hypertension   . Polio 1964   Past Surgical History:  Procedure Laterality Date  . FOOT SURGERY Left 1961   due to polio  . TONSILLECTOMY AND ADENOIDECTOMY     Family History  Problem Relation Age of Onset  . Hypertension Mother   . Stroke Mother   . Cancer Father 82       Unknown  . Stroke Sister        multiple  . Hypertension Sister   . Kidney disease Sister   . Diabetes Brother   . Hypertension Sister   . Hypertension Brother   . Kidney disease Son   . Diabetes Son   . Hypertension Daughter    History  Sexual Activity  . Sexual activity: Not Currently    Outpatient Encounter Prescriptions as of 04/08/2017  Medication Sig  . acetaminophen (TYLENOL) 500 MG tablet Take 2 tablets (1,000 mg total) by mouth 3 (three) times daily.  Marland Kitchen amLODipine (NORVASC) 10 MG tablet Take 1 tablet (10 mg total) by mouth daily.  Marland Kitchen aspirin 81 MG EC tablet Take 81 mg by mouth daily.    Marland Kitchen atorvastatin (LIPITOR) 80 MG tablet Take 1 tablet (80 mg total) by mouth daily.  . Calcium Carbonate-Vitamin D (CALTRATE 600+D)  600-400 MG-UNIT per tablet Take 1 tablet by mouth 2 (two) times daily.    . cloNIDine (CATAPRES) 0.1 MG tablet Take 0.1 mg by mouth 2 (two) times daily.  Marland Kitchen lisinopril-hydrochlorothiazide (PRINZIDE,ZESTORETIC) 20-12.5 MG per tablet Take 2 tablets by mouth daily.  . metoprolol tartrate (LOPRESSOR) 25 MG tablet Take 1 tablet (25 mg total) by mouth 2 (two) times daily. Need appt to discuss BP.  Marland Kitchen spironolactone (ALDACTONE) 25 MG tablet Take 1 tablet (25 mg total) by mouth daily. Need appt to discuss BP.  . traMADol (ULTRAM) 50 MG tablet Take 1 tablet (50 mg total) by mouth every 12 (twelve) hours as needed for moderate pain.  . naproxen (NAPROSYN) 500 MG tablet Take 1 tablet (500 mg total) by mouth 2 (two) times daily as needed. (Patient not taking: Reported on 04/08/2017)   No facility-administered encounter medications on file as of 04/08/2017.    Clonidine 0.1 mg bid added within last 3 months by Urgent Care provider.  Activities of Daily Living In your present state of health, do you have any difficulty performing the following activities: 04/08/2017  Hearing? N  Vision? N  Difficulty concentrating or making decisions?  N  Walking or climbing stairs? Y  Dressing or bathing? N  Doing errands, shopping? N  Preparing Food and eating ? N  Using the Toilet? N  In the past six months, have you accidently leaked urine? N  Do you have problems with loss of bowel control? N  Managing your Medications? N  Managing your Finances? N  Housekeeping or managing your Housekeeping? Y  Some recent data might be hidden   Home Safety:  My home has a working smoke alarm:  Yes X 2            My home throw rugs have been fastened down to the floor or removed:  Has one with non-slip back and one in [place] she will either remove or tack down.  I have a non-slip surface or non-slip mats in the bathtub and shower:  No, has grab bar on tub. Discussed getting mat or non-slip decals.         All my home's stairs have  handrails, including any outdoor stairs  One level home with 3 outside steps with handrails          My home's floors, stairs and hallways are free from clutter, wires and cords:  Yes     I have animals in my home  Yes, a terrier mix I wear seatbelts consistently:  Yes    Patient Care Team: Marjie Skiff, MD as PCP - General (Family Medicine)    Assessment:     Exercise Activities and Dietary recommendations Current Exercise Habits: Home exercise routine, Type of exercise: walking, Time (Minutes): 10, Frequency (Times/Week): 7, Weekly Exercise (Minutes/Week): 70, Intensity: Mild, Exercise limited by: orthopedic condition(s)  Goals    . Blood Pressure < 150/90    . Increase physical activity               . Weight (lb) < 189 lb (85.7 kg)          5 % weight loss       Patient completed last year's weight goal plus an additional 8 lb weight loss. Has set another 5% weight loss goal today.  Fall Risk Fall Risk  04/08/2017 02/27/2016 01/30/2016 12/04/2015  Falls in the past year? No Yes Yes No  Number falls in past yr: - 2 or more 2 or more -  Injury with Fall? - Yes Yes -  Risk Factor Category  - High Fall Risk High Fall Risk -  Risk for fall due to : History of fall(s);Impaired balance/gait;Impaired mobility - History of fall(s);Impaired balance/gait;Impaired mobility Other (Comment)  Follow up - - Falls prevention discussed -   Depression Screen PHQ 2/9 Scores 04/08/2017 03/22/2016 03/08/2016 02/27/2016  PHQ - 2 Score 0 0 0 0  Exception Documentation - - - -    TUG Test:  Done in 25 seconds. Patient used cane in right hand and left hand to push out of chair and to sit back down. Wears boot on left foot 2/2 multiple surgeries following polio as child. Falls prevention discussed in detail and literature given.  Cognitive Function: Mini-Cog  Passed with score 5/5   Immunization History  Administered Date(s) Administered  . Influenza Whole 07/27/2007  . Td 02/04/1990  .  Tdap 01/31/2016  . Zoster 01/31/2016   Screening Tests Health Maintenance  Topic Date Due  . INFLUENZA VACCINE  05/07/2017  . PAP SMEAR  06/30/2017  . MAMMOGRAM  11/04/2018  . COLONOSCOPY  06/08/2023  . TETANUS/TDAP  01/30/2026  .  Hepatitis C Screening  Completed  . HIV Screening  Completed      Plan:     Hgb A1c done today = 5.8 down from 6.3 on 02/27/2015 Patient states that she occasionally notes dark brown discharge when wiping after urination. Patient has been postmenopausal for years. Appt made for 04/22/2017 with new PCP to discuss.  I have personally reviewed and noted the following in the patient's chart:   . Medical and social history . Use of alcohol, tobacco or illicit drugs  . Current medications and supplements . Functional ability and status . Nutritional status . Physical activity . Advanced directives . List of other physicians . Hospitalizations, surgeries, and ER visits in previous 12 months . Vitals . Screenings to include cognitive, depression, and falls . Referrals and appointments  In addition, I have reviewed and discussed with patient certain preventive protocols, quality metrics, and best practice recommendations. A written personalized care plan for preventive services as well as general preventive health recommendations were provided to patient.     Velora Heckler, RN  04/08/2017

## 2017-04-08 NOTE — Patient Instructions (Signed)
Fall Prevention in the Home Falls can cause injuries. They can happen to people of all ages. There are many things you can do to make your home safe and to help prevent falls. What can I do on the outside of my home?  Regularly fix the edges of walkways and driveways and fix any cracks.  Remove anything that might make you trip as you walk through a door, such as a raised step or threshold.  Trim any bushes or trees on the path to your home.  Use bright outdoor lighting.  Clear any walking paths of anything that might make someone trip, such as rocks or tools.  Regularly check to see if handrails are loose or broken. Make sure that both sides of any steps have handrails.  Any raised decks and porches should have guardrails on the edges.  Have any leaves, snow, or ice cleared regularly.  Use sand or salt on walking paths during winter.  Clean up any spills in your garage right away. This includes oil or grease spills. What can I do in the bathroom?  Use night lights.  Install grab bars by the toilet and in the tub and shower. Do not use towel bars as grab bars.  Use non-skid mats or decals in the tub or shower.  If you need to sit down in the shower, use a plastic, non-slip stool.  Keep the floor dry. Clean up any water that spills on the floor as soon as it happens.  Remove soap buildup in the tub or shower regularly.  Attach bath mats securely with double-sided non-slip rug tape.  Do not have throw rugs and other things on the floor that can make you trip. What can I do in the bedroom?  Use night lights.  Make sure that you have a light by your bed that is easy to reach.  Do not use any sheets or blankets that are too big for your bed. They should not hang down onto the floor.  Have a firm chair that has side arms. You can use this for support while you get dressed.  Do not have throw rugs and other things on the floor that can make you trip. What can I do in  the kitchen?  Clean up any spills right away.  Avoid walking on wet floors.  Keep items that you use a lot in easy-to-reach places.  If you need to reach something above you, use a strong step stool that has a grab bar.  Keep electrical cords out of the way.  Do not use floor polish or wax that makes floors slippery. If you must use wax, use non-skid floor wax.  Do not have throw rugs and other things on the floor that can make you trip. What can I do with my stairs?  Do not leave any items on the stairs.  Make sure that there are handrails on both sides of the stairs and use them. Fix handrails that are broken or loose. Make sure that handrails are as long as the stairways.  Check any carpeting to make sure that it is firmly attached to the stairs. Fix any carpet that is loose or worn.  Avoid having throw rugs at the top or bottom of the stairs. If you do have throw rugs, attach them to the floor with carpet tape.  Make sure that you have a light switch at the top of the stairs and the bottom of the stairs. If you  do not have them, ask someone to add them for you. What else can I do to help prevent falls?  Wear shoes that: ? Do not have high heels. ? Have rubber bottoms. ? Are comfortable and fit you well. ? Are closed at the toe. Do not wear sandals.  If you use a stepladder: ? Make sure that it is fully opened. Do not climb a closed stepladder. ? Make sure that both sides of the stepladder are locked into place. ? Ask someone to hold it for you, if possible.  Clearly mark and make sure that you can see: ? Any grab bars or handrails. ? First and last steps. ? Where the edge of each step is.  Use tools that help you move around (mobility aids) if they are needed. These include: ? Canes. ? Walkers. ? Scooters. ? Crutches.  Turn on the lights when you go into a dark area. Replace any light bulbs as soon as they burn out.  Set up your furniture so you have a clear  path. Avoid moving your furniture around.  If any of your floors are uneven, fix them.  If there are any pets around you, be aware of where they are.  Review your medicines with your doctor. Some medicines can make you feel dizzy. This can increase your chance of falling. Ask your doctor what other things that you can do to help prevent falls. This information is not intended to replace advice given to you by your health care provider. Make sure you discuss any questions you have with your health care provider. Document Released: 07/20/2009 Document Revised: 02/29/2016 Document Reviewed: 10/28/2014 Elsevier Interactive Patient Education  2018 [employer]  Health Maintenance, Female Adopting a healthy lifestyle and getting preventive care can go a long way to promote health and wellness. Talk with your health care provider about what schedule of regular examinations is right for you. This is a good chance for you to check in with your provider about disease prevention and staying healthy. In between checkups, there are plenty of things you can do on your own. Experts have done a lot of research about which lifestyle changes and preventive measures are most likely to keep you healthy. Ask your health care provider for more information. Weight and diet Eat a healthy diet  Be sure to include plenty of vegetables, fruits, low-fat dairy products, and lean protein.  Do not eat a lot of foods high in solid fats, added sugars, or salt.  Get regular exercise. This is one of the most important things you can do for your health. ? Most adults should exercise for at least 150 minutes each week. The exercise should increase your heart rate and make you sweat (moderate-intensity exercise). ? Most adults should also do strengthening exercises at least twice a week. This is in addition to the moderate-intensity exercise.  Maintain a healthy weight  Body mass index (BMI) is a measurement that can be  used to identify possible weight problems. It estimates body fat based on height and weight. Your health care provider can help determine your BMI and help you achieve or maintain a healthy weight.  For females 20 years of age and older: ? A BMI below 18.5 is considered underweight. ? A BMI of 18.5 to 24.9 is normal. ? A BMI of 25 to 29.9 is considered overweight. ? A BMI of 30 and above is considered obese.  Watch levels of cholesterol and blood lipids  You should   start having your blood tested for lipids and cholesterol at 64 years of age, then have this test every 5 years.  You may need to have your cholesterol levels checked more often if: ? Your lipid or cholesterol levels are high. ? You are older than 64 years of age. ? You are at high risk for heart disease.  Cancer screening Lung Cancer  Lung cancer screening is recommended for adults 55-80 years old who are at high risk for lung cancer because of a history of smoking.  A yearly low-dose CT scan of the lungs is recommended for people who: ? Currently smoke. ? Have quit within the past 15 years. ? Have at least a 30-pack-year history of smoking. A pack year is smoking an average of one pack of cigarettes a day for 1 year.  Yearly screening should continue until it has been 15 years since you quit.  Yearly screening should stop if you develop a health problem that would prevent you from having lung cancer treatment.  Breast Cancer  Practice breast self-awareness. This means understanding how your breasts normally appear and feel.  It also means doing regular breast self-exams. Let your health care provider know about any changes, no matter how small.  If you are in your 20s or 30s, you should have a clinical breast exam (CBE) by a health care provider every 1-3 years as part of a regular health exam.  If you are 40 or older, have a CBE every year. Also consider having a breast X-ray (mammogram) every year.  If you have  a family history of breast cancer, talk to your health care provider about genetic screening.  If you are at high risk for breast cancer, talk to your health care provider about having an MRI and a mammogram every year.  Breast cancer gene (BRCA) assessment is recommended for women who have family members with BRCA-related cancers. BRCA-related cancers include: ? Breast. ? Ovarian. ? Tubal. ? Peritoneal cancers.  Results of the assessment will determine the need for genetic counseling and BRCA1 and BRCA2 testing.  Cervical Cancer Your health care provider may recommend that you be screened regularly for cancer of the pelvic organs (ovaries, uterus, and vagina). This screening involves a pelvic examination, including checking for microscopic changes to the surface of your cervix (Pap test). You may be encouraged to have this screening done every 3 years, beginning at age 21.  For women ages 30-65, health care providers may recommend pelvic exams and Pap testing every 3 years, or they may recommend the Pap and pelvic exam, combined with testing for human papilloma virus (HPV), every 5 years. Some types of HPV increase your risk of cervical cancer. Testing for HPV may also be done on women of any age with unclear Pap test results.  Other health care providers may not recommend any screening for nonpregnant women who are considered low risk for pelvic cancer and who do not have symptoms. Ask your health care provider if a screening pelvic exam is right for you.  If you have had past treatment for cervical cancer or a condition that could lead to cancer, you need Pap tests and screening for cancer for at least 20 years after your treatment. If Pap tests have been discontinued, your risk factors (such as having a new sexual partner) need to be reassessed to determine if screening should resume. Some women have medical problems that increase the chance of getting cervical cancer. In these cases, your    health care provider may recommend more frequent screening and Pap tests.  Colorectal Cancer  This type of cancer can be detected and often prevented.  Routine colorectal cancer screening usually begins at 64 years of age and continues through 64 years of age.  Your health care provider may recommend screening at an earlier age if you have risk factors for colon cancer.  Your health care provider may also recommend using home test kits to check for hidden blood in the stool.  A small camera at the end of a tube can be used to examine your colon directly (sigmoidoscopy or colonoscopy). This is done to check for the earliest forms of colorectal cancer.  Routine screening usually begins at age 50.  Direct examination of the colon should be repeated every 5-10 years through 64 years of age. However, you may need to be screened more often if early forms of precancerous polyps or small growths are found.  Skin Cancer  Check your skin from head to toe regularly.  Tell your health care provider about any new moles or changes in moles, especially if there is a change in a mole's shape or color.  Also tell your health care provider if you have a mole that is larger than the size of a pencil eraser.  Always use sunscreen. Apply sunscreen liberally and repeatedly throughout the day.  Protect yourself by wearing long sleeves, pants, a wide-brimmed hat, and sunglasses whenever you are outside.  Heart disease, diabetes, and high blood pressure  High blood pressure causes heart disease and increases the risk of stroke. High blood pressure is more likely to develop in: ? People who have blood pressure in the high end of the normal range (130-139/85-89 mm Hg). ? People who are overweight or obese. ? People who are African American.  If you are 18-39 years of age, have your blood pressure checked every 3-5 years. If you are 40 years of age or older, have your blood pressure checked every year. You  should have your blood pressure measured twice-once when you are at a hospital or clinic, and once when you are not at a hospital or clinic. Record the average of the two measurements. To check your blood pressure when you are not at a hospital or clinic, you can use: ? An automated blood pressure machine at a pharmacy. ? A home blood pressure monitor.  If you are between 55 years and 79 years old, ask your health care provider if you should take aspirin to prevent strokes.  Have regular diabetes screenings. This involves taking a blood sample to check your fasting blood sugar level. ? If you are at a normal weight and have a low risk for diabetes, have this test once every three years after 64 years of age. ? If you are overweight and have a high risk for diabetes, consider being tested at a younger age or more often. Preventing infection Hepatitis B  If you have a higher risk for hepatitis B, you should be screened for this virus. You are considered at high risk for hepatitis B if: ? You were born in a country where hepatitis B is common. Ask your health care provider which countries are considered high risk. ? Your parents were born in a high-risk country, and you have not been immunized against hepatitis B (hepatitis B vaccine). ? You have HIV or AIDS. ? You use needles to inject street drugs. ? You live with someone who has hepatitis B. ?   You have had sex with someone who has hepatitis B. ? You get hemodialysis treatment. ? You take certain medicines for conditions, including cancer, organ transplantation, and autoimmune conditions.  Hepatitis C  Blood testing is recommended for: ? Everyone born from 1945 through 1965. ? Anyone with known risk factors for hepatitis C.  Sexually transmitted infections (STIs)  You should be screened for sexually transmitted infections (STIs) including gonorrhea and chlamydia if: ? You are sexually active and are younger than 64 years of age. ? You  are older than 64 years of age and your health care provider tells you that you are at risk for this type of infection. ? Your sexual activity has changed since you were last screened and you are at an increased risk for chlamydia or gonorrhea. Ask your health care provider if you are at risk.  If you do not have HIV, but are at risk, it may be recommended that you take a prescription medicine daily to prevent HIV infection. This is called pre-exposure prophylaxis (PrEP). You are considered at risk if: ? You are sexually active and do not regularly use condoms or know the HIV status of your partner(s). ? You take drugs by injection. ? You are sexually active with a partner who has HIV.  Talk with your health care provider about whether you are at high risk of being infected with HIV. If you choose to begin PrEP, you should first be tested for HIV. You should then be tested every 3 months for as long as you are taking PrEP. Pregnancy  If you are premenopausal and you may become pregnant, ask your health care provider about preconception counseling.  If you may become pregnant, take 400 to 800 micrograms (mcg) of folic acid every day.  If you want to prevent pregnancy, talk to your health care provider about birth control (contraception). Osteoporosis and menopause  Osteoporosis is a disease in which the bones lose minerals and strength with aging. This can result in serious bone fractures. Your risk for osteoporosis can be identified using a bone density scan.  If you are 65 years of age or older, or if you are at risk for osteoporosis and fractures, ask your health care provider if you should be screened.  Ask your health care provider whether you should take a calcium or vitamin D supplement to lower your risk for osteoporosis.  Menopause may have certain physical symptoms and risks.  Hormone replacement therapy may reduce some of these symptoms and risks. Talk to your health care  provider about whether hormone replacement therapy is right for you. Follow these instructions at home:  Schedule regular health, dental, and eye exams.  Stay current with your immunizations.  Do not use any tobacco products including cigarettes, chewing tobacco, or electronic cigarettes.  If you are pregnant, do not drink alcohol.  If you are breastfeeding, limit how much and how often you drink alcohol.  Limit alcohol intake to no more than 1 drink per day for nonpregnant women. One drink equals 12 ounces of beer, 5 ounces of wine, or 1 ounces of hard liquor.  Do not use street drugs.  Do not share needles.  Ask your health care provider for help if you need support or information about quitting drugs.  Tell your health care provider if you often feel depressed.  Tell your health care provider if you have ever been abused or do not feel safe at home. This information is not intended to replace   advice given to you by your health care provider. Make sure you discuss any questions you have with your health care provider. Document Released: 04/08/2011 Document Revised: 02/29/2016 Document Reviewed: 06/27/2015 Elsevier Interactive Patient Education  2018 [employer] Inc.  

## 2017-04-10 ENCOUNTER — Encounter: Payer: Self-pay | Admitting: *Deleted

## 2017-04-22 ENCOUNTER — Ambulatory Visit: Payer: 59 | Admitting: Family Medicine

## 2017-04-28 ENCOUNTER — Ambulatory Visit: Payer: Medicare Other | Admitting: Family Medicine

## 2017-05-15 DIAGNOSIS — M79671 Pain in right foot: Secondary | ICD-10-CM | POA: Diagnosis not present

## 2017-05-28 ENCOUNTER — Other Ambulatory Visit: Payer: Self-pay | Admitting: Podiatry

## 2017-05-28 ENCOUNTER — Ambulatory Visit (INDEPENDENT_AMBULATORY_CARE_PROVIDER_SITE_OTHER): Payer: Medicare Other | Admitting: Podiatry

## 2017-05-28 ENCOUNTER — Ambulatory Visit (INDEPENDENT_AMBULATORY_CARE_PROVIDER_SITE_OTHER): Payer: Medicare Other

## 2017-05-28 ENCOUNTER — Encounter: Payer: Self-pay | Admitting: Podiatry

## 2017-05-28 VITALS — BP 150/82 | HR 58 | Resp 16

## 2017-05-28 DIAGNOSIS — M79671 Pain in right foot: Secondary | ICD-10-CM

## 2017-05-28 DIAGNOSIS — M779 Enthesopathy, unspecified: Secondary | ICD-10-CM

## 2017-05-28 NOTE — Progress Notes (Signed)
   Subjective:    Patient ID: Sarah [name], female    DOB: [birth year], 66 y.o.   MRN: [id number]  HPI Chief Complaint  Patient presents with  . Foot Pain    right foot; lateral side; pt stated, "I had Polio in my left foot and wear a boot for that; but my right foot has hurt for the past 2 months"      Review of Systems  Cardiovascular: Positive for leg swelling.  Musculoskeletal: Positive for arthralgias.       Objective:   Physical Exam        Assessment & Plan:

## 2017-05-29 NOTE — Progress Notes (Signed)
Subjective:    Patient ID: Sarah [name], female   DOB: 66 y.o.   MRN: [id number]   HPI patient presents stating she's had a lot of pain in the outside of the right foot and it's making walking difficult. States it's been going on now for a while and she has polio and put a lot of pressure on her right foot due to change in gait left    Review of Systems  All other systems reviewed and are negative.       Objective:  Physical Exam  Cardiovascular: Intact distal pulses.   Musculoskeletal: Normal range of motion.  Neurological: She is alert.  Skin: Skin is warm.  Nursing note and vitals reviewed.  neurovascular status intact muscle strength adequate range of motion within normal limits with patient noted to have exquisite discomfort around the fifth MPJ right with fluid buildup around the joint surface and pain when palpated. Patient's noted to have good digital perfusion and is well oriented 3 and does have significant deformity left with history of polio with a fixed cavus and equinus foot type     Assessment:  Inflammatory capsulitis fifth MPJ right with polio left with deformity       Plan:   H&P conditions reviewed to focus on the discomfort fifth MPJ right and I did careful injection of the subcapsular area with 2 mg dexamethasone 2 mg Kenalog 5 mill grams Xylocaine and padded the areas to take pressure off it. Patient will be seen back to recheck  X-ray indicates is no signs stress fracture or arthritis of this deformity

## 2017-07-02 ENCOUNTER — Ambulatory Visit: Payer: Medicare Other | Admitting: Family Medicine

## 2017-07-07 ENCOUNTER — Other Ambulatory Visit (HOSPITAL_COMMUNITY)
Admission: RE | Admit: 2017-07-07 | Discharge: 2017-07-07 | Disposition: A | Discharge status: Home / Self Care | Payer: Medicare Other | Source: Ambulatory Visit | Attending: Family Medicine | Admitting: Family Medicine

## 2017-07-07 ENCOUNTER — Ambulatory Visit (INDEPENDENT_AMBULATORY_CARE_PROVIDER_SITE_OTHER): Payer: Medicare Other | Admitting: Family Medicine

## 2017-07-07 ENCOUNTER — Encounter: Payer: Self-pay | Admitting: Family Medicine

## 2017-07-07 VITALS — BP 160/75 | HR 65 | Temp 98.2°F | Ht 65.0 in | Wt 204.8 lb

## 2017-07-07 DIAGNOSIS — N950 Postmenopausal bleeding: Secondary | ICD-10-CM | POA: Insufficient documentation

## 2017-07-07 HISTORY — DX: Postmenopausal bleeding: N95.0

## 2017-07-07 NOTE — Progress Notes (Signed)
    Subjective:  Sarah [name] is a 66 y.o. female who presents to the [facility] today with a chief complaint of vaginal bleeding.   HPI:  Vaginal bleeding - Went through menopause around age 74, has not had a period in about 10 years - Started having some spotting 2 months ago that lasted for about a week - This month she had what seems like a full period that lasted about 10 days without excessive bleeding but did pass some bright red clots and eventually stopped  - no vaginal  bleeding today - Denies dysuria or any urinary symptoms, no blood in stool, no abdominal pain or cramping. - No lightheadedness or dizziness, no weight loss, no night sweat   ROS: Per HPI  Objective:  Physical Exam: BP (!) 160/75 (BP Location: Left Arm, Patient Position: Sitting, Cuff Size: Normal)   Pulse 65   Temp 98.2 F (36.8 C) (Oral)   Wt 204 lb 12.8 oz (92.9 kg)   SpO2 98%   BMI 34.08 kg/m   Gen: NAD, resting comfortably GI: Normal bowel sounds present. Soft, Nontender, Nondistended. GU: normal female genitalia. normal external genitalia, vulva, vagina, cervix, uterus and adnexa, VAGINA: normal appearing vagina with normal color and discharge, no lesions, CERVIX: normal appearing cervix without discharge or lesions, no CMT. PAP: Pap smear done today. Skin: warm, dry Neuro: grossly normal, moves all extremities Psych: Normal affect and thought content   Assessment/Plan:  Postmenopausal bleeding Patient here with post menopausal bleeding. Recommended starting with transvaginal ultrasound but discussed with patient that she may need endometrial biopsy to r/o cancer. Will also check CBC to make sure patient is not overly anemic although she looks well and has no symptoms of anemia today.   Bufford Lope, DO PGY-2, [facility] 07/07/2017 9:07 AM

## 2017-07-07 NOTE — Patient Instructions (Addendum)
It was good to see you today!  For your post menopausal bleeding - We will check some bloodwork to make sure you aren't anemic.If results require attention, either myself or my nurse will get in touch with you. If everything is normal, you will get a letter in the mail or a message in My Chart. Please give [place] a call if you do not hear from [place] after 2 weeks. - We need to do a pelvic ultrasound to take a look at your uterus. Pending the ultrasound results, you may need an endometrial biopsy. I will call you regarding these results to make a follow up plan for your next appointment  We checked a pap smear today.  Stop taking your baby aspirin81mg  for now.  Please bring all of your medications with you to each visit.   Sign up for My Chart to have easy access to your labs results, and communication with your primary care physician.  Feel free to call with any questions or concerns at any time, at [phone].   Take care,  Dr. Bufford Lope, [place]

## 2017-07-07 NOTE — Assessment & Plan Note (Signed)
Patient here with post menopausal bleeding. Recommended starting with transvaginal ultrasound but discussed with patient that she may need endometrial biopsy to r/o cancer. Will also check CBC to make sure patient is not overly anemic although she looks well and has no symptoms of anemia today.

## 2017-07-08 ENCOUNTER — Encounter: Payer: Self-pay | Admitting: Family Medicine

## 2017-07-08 LAB — CBC
Hematocrit: 39.6 % (ref 34.0–46.6)
Hemoglobin: 13 g/dL (ref 11.1–15.9)
MCH: 30.7 pg (ref 26.6–33.0)
MCHC: 32.8 g/dL (ref 31.5–35.7)
MCV: 94 fL (ref 79–97)
Platelets: 199 10*3/uL (ref 150–379)
RBC: 4.23 x10E6/uL (ref 3.77–5.28)
RDW: 12.9 % (ref 12.3–15.4)
WBC: 5.5 10*3/uL (ref 3.4–10.8)

## 2017-07-08 LAB — CYTOLOGY - PAP: Diagnosis: NEGATIVE

## 2017-07-10 ENCOUNTER — Ambulatory Visit (INDEPENDENT_AMBULATORY_CARE_PROVIDER_SITE_OTHER): Payer: Medicare Other | Admitting: Podiatry

## 2017-07-10 DIAGNOSIS — M898X7 Other specified disorders of bone, ankle and foot: Secondary | ICD-10-CM

## 2017-07-10 DIAGNOSIS — M779 Enthesopathy, unspecified: Secondary | ICD-10-CM | POA: Diagnosis not present

## 2017-07-10 NOTE — Patient Instructions (Signed)
Pre-Operative Instructions  Congratulations, you have decided to take an important step towards improving your quality of life.  You can be assured that the doctors and staff at [facility] will be with you every step of the way.  Here are some important things you should know:  1. Plan to be at the surgery center/hospital at least 1 (one) hour prior to your scheduled time, unless otherwise directed by the surgical center/hospital staff.  You must have a responsible adult accompany you, remain during the surgery and drive you home.  Make sure you have directions to the surgical center/hospital to ensure you arrive on time. 2. If you are having surgery at [facility] or [facility], you will need a copy of your medical history and physical form from your family physician within one month prior to the date of surgery. We will give you a form for your primary physician to complete.  3. We make every effort to accommodate the date you request for surgery.  However, there are times where surgery dates or times have to be moved.  We will contact you as soon as possible if a change in schedule is required.   4. No aspirin/ibuprofen for one week before surgery.  If you are on aspirin, any non-steroidal anti-inflammatory medications (Mobic, Aleve, Ibuprofen) should not be taken seven (7) days prior to your surgery.  You make take Tylenol for pain prior to surgery.  5. Medications - If you are taking daily heart and blood pressure medications, seizure, reflux, allergy, asthma, anxiety, pain or diabetes medications, make sure you notify the surgery center/hospital before the day of surgery so they can tell you which medications you should take or avoid the day of surgery. 6. No food or drink after midnight the night before surgery unless directed otherwise by surgical center/hospital staff. 7. No alcoholic beverages 24-hours prior to surgery.  No smoking 24-hours prior or 24-hours after  surgery. 8. Wear loose pants or shorts. They should be loose enough to fit over bandages, boots, and casts. 9. Don't wear slip-on shoes. Sneakers are preferred. 10. Bring your boot with you to the surgery center/hospital.  Also bring crutches or a walker if your physician has prescribed it for you.  If you do not have this equipment, it will be provided for you after surgery. 11. If you have not been contacted by the surgery center/hospital by the day before your surgery, call to confirm the date and time of your surgery. 12. Leave-time from work may vary depending on the type of surgery you have.  Appropriate arrangements should be made prior to surgery with your employer. 13. Prescriptions will be provided immediately following surgery by your doctor.  Fill these as soon as possible after surgery and take the medication as directed. Pain medications will not be refilled on weekends and must be approved by the doctor. 14. Remove nail polish on the operative foot and avoid getting pedicures prior to surgery. 15. Wash the night before surgery.  The night before surgery wash the foot and leg well with water and the antibacterial soap provided. Be sure to pay special attention to beneath the toenails and in between the toes.  Wash for at least three (3) minutes. Rinse thoroughly with water and dry well with a towel.  Perform this wash unless told not to do so by your physician.  Enclosed: 1 Ice pack (please put in freezer the night before surgery)   1 Hibiclens skin cleaner     Pre-op instructions  If you have any questions regarding the instructions, please do not hesitate to call our office.  [place]: [address], [place], [place] [address] -- [phone]  [place]: [address] , [place] [address] -- [phone]  [place]: [address]  [place], [place] [address] -- [phone]  [place]: [address], [address], [place],  [address] -- [phone]  Website:  [email or web address] 

## 2017-07-11 ENCOUNTER — Ambulatory Visit (HOSPITAL_COMMUNITY)
Admission: RE | Admit: 2017-07-11 | Discharge: 2017-07-11 | Disposition: A | Discharge status: Home / Self Care | Payer: Medicare Other | Source: Ambulatory Visit | Attending: Family Medicine | Admitting: Family Medicine

## 2017-07-11 DIAGNOSIS — N950 Postmenopausal bleeding: Secondary | ICD-10-CM | POA: Diagnosis present

## 2017-07-11 DIAGNOSIS — R9389 Abnormal findings on diagnostic imaging of other specified body structures: Secondary | ICD-10-CM | POA: Insufficient documentation

## 2017-07-11 DIAGNOSIS — D251 Intramural leiomyoma of uterus: Secondary | ICD-10-CM | POA: Insufficient documentation

## 2017-07-11 NOTE — Progress Notes (Signed)
  Subjective:  Patient ID: Sarah [name], female    DOB: 1951/05/25,  MRN: [id number]  Chief Complaint  Patient presents with  . Foot Pain    bilateral   66 y.o. female returns for the above complaint. Reports history of polio for which she walks on the outside of her foot. Reports painful calluses from this.  Objective:  There were no vitals filed for this visit. General AA&O x3. Normal mood and affect.  Vascular Dorsalis pedis and posterior tibial pulses present 2+ bilaterally  Capillary refill normal to all digits. Pedal hair growth normal.  Neurologic Epicritic sensation grossly present.  Dermatologic No open lesions. Interspaces clear of maceration. Nails well groomed and normal in appearance. Hyperkeratotic lesions L 5th metatarsal head, 5th met base.  Orthopedic: Severe adductovarus foot type. Ambulates by putting pressure on the lateral column.   Assessment & Plan:  Patient was evaluated and treated and all questions answered.  Left fifth met head capsulitis with deep hyperkeratosis; left fifth tarsometatarsal joint capsulitis with deep hyperkeratosis -X-rays to be performed prior to surgery. -Discussed surgical option of shaving down the bones underneath her callus to make them go away. Would need complete excision of the 5th metatarsal head with exostectomy of the 5th met base. Patient wishes to proceed with this option. All risks/benefits/alternatives given. No guarantees given. Patient verbalized understanding. -Will plan for surgery at a date to be determined by surgical coordinator.

## 2017-07-14 ENCOUNTER — Telehealth: Payer: Self-pay | Admitting: *Deleted

## 2017-07-14 NOTE — Telephone Encounter (Signed)
Spoke with patient in order to schedule her appointment but she is not available after 10-17 due to having foot surgery that day.  Patient will call us back to schedule this one she is feeling better and able to get around. Jazmin Hartsell,CMA

## 2017-07-14 NOTE — Telephone Encounter (Signed)
"  My name is Venida Jarvis and I'm calling on behalf of my ma, Delara Shepheard.  She was there yesterday and she was supposed to call you back to give you her primary care physician name.  It's Dr. Cloyd Stagers.  You can give my mom a call back."

## 2017-07-14 NOTE — Telephone Encounter (Signed)
-----   Message from Bufford Lope, DO sent at 07/14/2017  4:20 PM EDT ----- Patient called.  Patient aware of thickened endometrium and of fibroids. Discussed endometrial biopsy and patient is agreeable. Can she please be scheduled in [place] clinic for this here at [facility]?

## 2017-07-15 NOTE — Telephone Encounter (Signed)
FYI to make you aware as PCP that patient needs EMB but is not scheduled yet.

## 2017-07-23 ENCOUNTER — Telehealth: Payer: Self-pay | Admitting: Podiatry

## 2017-07-23 ENCOUNTER — Encounter: Payer: Self-pay | Admitting: Podiatry

## 2017-07-23 DIAGNOSIS — M779 Enthesopathy, unspecified: Secondary | ICD-10-CM | POA: Diagnosis not present

## 2017-07-23 DIAGNOSIS — M25775 Osteophyte, left foot: Secondary | ICD-10-CM | POA: Diagnosis not present

## 2017-07-23 DIAGNOSIS — M216X2 Other acquired deformities of left foot: Secondary | ICD-10-CM | POA: Diagnosis not present

## 2017-07-23 DIAGNOSIS — E7800 Pure hypercholesterolemia, unspecified: Secondary | ICD-10-CM | POA: Diagnosis not present

## 2017-07-23 DIAGNOSIS — M898X7 Other specified disorders of bone, ankle and foot: Secondary | ICD-10-CM | POA: Diagnosis not present

## 2017-07-23 NOTE — Telephone Encounter (Signed)
This is Product/process development scientist calling in regards to Rx written by Dr. March Rummage for percocet [phone] and I need to get a diagnosis on the Rx. Please call me back at [phone]. Thank you.

## 2017-07-23 NOTE — Telephone Encounter (Signed)
This is Sarah [name], Maureena's daughter. She just had surgery done by Dr. March Rummage. There is a prescription being filled for percocet, for nausea, and an antibiotic. They would not fill the percocet because they need more information from your office. So we need someone from your office to call and get this taken care of. You can reach me at [phone].

## 2017-07-23 NOTE — Telephone Encounter (Signed)
I informed Museum/gallery curator that pt had surgery for exostosis of bone in ankle, and the rx was for surgical pain management. I informed pt's dtr, Tye [place] that pt would be able to pick up prescription today.

## 2017-07-25 ENCOUNTER — Telehealth: Payer: Self-pay | Admitting: *Deleted

## 2017-07-25 NOTE — Telephone Encounter (Signed)
Called and left a message for the patient asking to see how patient was after surgery and to call us and let us know and my phone number is [phone]. Lattie Haw

## 2017-07-28 NOTE — Progress Notes (Signed)
DOS 10.17.18 Lt foot 5th metatarsal head resection, 5th met base exostectomy

## 2017-07-30 ENCOUNTER — Ambulatory Visit (INDEPENDENT_AMBULATORY_CARE_PROVIDER_SITE_OTHER): Payer: Medicare Other | Admitting: Podiatry

## 2017-07-30 DIAGNOSIS — Z9889 Other specified postprocedural states: Secondary | ICD-10-CM

## 2017-07-30 MED ORDER — OXYCODONE-ACETAMINOPHEN 10-325 MG PO TABS: 1.0000 | ORAL_TABLET | ORAL | 0 refills | Status: DC | PRN

## 2017-07-31 NOTE — Progress Notes (Addendum)
  Subjective:  Patient ID: Sarah [name], female    DOB: Jul 12, 1951,  MRN: [id number]  DOS: 07/23/17 Procedure: L 5th metatarsal head excision, 5th met base exostectomy  66 y.o. female returns for operative follow-up of the above surgery.  Reports pain that is controlled by her pain medications denies nausea vomiting fever chills.  Denies other pedal issues.  Objective:  There were no vitals filed for this visit. General AA&O x3. Normal mood and affect.  Vascular Foot warm and well perfused.  Neurologic Gross sensation intact.  Dermatologic Skin healing well with intact suture material.  No erythema drainage or signs of infection.  Orthopedic: Tenderness to palpation noted about the surgical site.   Assessment & Plan:  Patient was evaluated and treated and all questions answered.  Status post left fifth metatarsal head excision, fifth met base exostectomy -Skin healing well without infection. Sutures left intact. -Foot redressed.  Return in about 1 week (around 08/06/2017) for post-op check.

## 2017-08-06 ENCOUNTER — Ambulatory Visit (INDEPENDENT_AMBULATORY_CARE_PROVIDER_SITE_OTHER): Payer: Self-pay | Admitting: Podiatry

## 2017-08-06 DIAGNOSIS — Z9889 Other specified postprocedural states: Secondary | ICD-10-CM

## 2017-08-06 NOTE — Progress Notes (Addendum)
  Subjective:  Patient ID: Sarah [name], female    DOB: 06/21/1951,  MRN: [id number]  DOS: 07/23/17 Procedure: L 5th metatarsal head excision, 5th met base exostectomy  66 y.o. female returns for post-op check. Denies N/V/F/Ch. Pain is controlled with current medications. Reports some swelling. Her grandson reports that she has been driving.  Objective:   General AA&O x3. Normal mood and affect.  Vascular Foot warm and well perfused.  Neurologic Gross sensation intact.  Dermatologic Skin healing well without signs of infection. Skin edges well coapted without signs of infection.  Orthopedic: Tenderness to palpation noted about the surgical site.    Assessment & Plan:  Patient was evaluated and treated and all questions answered.  S/p L 5th Met Head Excision, L 5th metatarsal base exostectomy -Progressing as expected post-operatively. -Sutures: removed.. -Medications refilled: none -Foot redressed.  F/u in 2 weeks.

## 2017-08-13 ENCOUNTER — Ambulatory Visit (INDEPENDENT_AMBULATORY_CARE_PROVIDER_SITE_OTHER): Payer: Medicare Other | Admitting: Podiatry

## 2017-08-13 ENCOUNTER — Encounter: Payer: Self-pay | Admitting: Podiatry

## 2017-08-13 DIAGNOSIS — Z9889 Other specified postprocedural states: Secondary | ICD-10-CM

## 2017-08-21 ENCOUNTER — Ambulatory Visit (INDEPENDENT_AMBULATORY_CARE_PROVIDER_SITE_OTHER): Payer: Medicare Other | Admitting: Podiatry

## 2017-08-21 ENCOUNTER — Encounter: Payer: Self-pay | Admitting: Podiatry

## 2017-08-21 ENCOUNTER — Ambulatory Visit (INDEPENDENT_AMBULATORY_CARE_PROVIDER_SITE_OTHER): Payer: Medicare Other

## 2017-08-21 DIAGNOSIS — Z9889 Other specified postprocedural states: Secondary | ICD-10-CM | POA: Diagnosis not present

## 2017-08-22 ENCOUNTER — Telehealth: Payer: Self-pay | Admitting: *Deleted

## 2017-08-22 MED ORDER — AMLODIPINE BESYLATE 10 MG PO TABS: 10.0000 mg | ORAL_TABLET | Freq: Every day | ORAL | 0 refills | Status: DC

## 2017-08-22 NOTE — Telephone Encounter (Signed)
Thank  You for taking care of that. Need to meet patient so we can discuss her meds. Thanks for arranging that too.  Marjie Skiff, MD [place], PGY-2

## 2017-08-22 NOTE — Telephone Encounter (Signed)
Pharmacy called and wanted to get a verbal for amlodipine refill, states that they sent a faxed refill request earlier this week.   Reviewed chart, nothing in chart.  Gave verbal for 3 months (mail order pharmacy) but they will inform pt that she will need to make an appt to followup on her BP and Meet her new provider.

## 2017-09-07 NOTE — Progress Notes (Signed)
  Subjective:  Patient ID: Sarah [name], female    DOB: 10-23-1950,  MRN: [id number]  Chief Complaint  Patient presents with  . Routine Post Op    L 5th metatarsal head excision, 5th met base exostectomy - "has pain on bottom of foot when putting pressure on it"     DOS: 07/23/17 Procedure: Left fifth metatarsal head excision, fifth met base vasectomy  66 y.o. female returns for post-op check.  Has been ambulating normal shoe gear.  States that she has pain on the bottom of the foot and putting pressure on Objective:   General AA&O x3. Normal mood and affect.  Vascular Foot warm and well perfused.  Neurologic Gross sensation intact.  Dermatologic Skin well-healed.  Orthopedic:  Slight tenderness to palpation noted about the fifth metatarsal base   Assessment & Plan:  Patient was evaluated and treated and all questions answered.  S/p fifth metatarsal head excision, fifth met base exostectomy -Progressing as expected post-operatively. -Padding dispensed -Discussed with patient that she could still benefit from custom shoe given the severity of her forefoot deformity  Return in about 1 month (around 09/20/2017) for Post-op.

## 2017-09-07 NOTE — Progress Notes (Signed)
  Subjective:  Patient ID: Sarah [name], female    DOB: [birth year],  MRN: [id number]  Chief Complaint  Patient presents with  . Routine Post Op    my left foot is doing better and the swelling has gone down    DOS: 07/23/17 Procedure: Left fifth metatarsal head excision, fifth met base exostectomy  66 y.o. female returns for post-op check. States her left foot is doing better swelling has gone down.  Minimal pain today Objective:   General AA&O x3. Normal mood and affect.  Vascular Foot warm and well perfused.  Neurologic Gross sensation intact.  Dermatologic Skin healing well without signs of infection. Skin edges well coapted without signs of infection.  Orthopedic: Tenderness to palpation noted about the surgical site.    Assessment & Plan:  Patient was evaluated and treated and all questions answered.  S/p L 5th met head excision, 5th met base exostectomy. -Progressing as expected post-operatively. -Sutures: out. -Medications refilled: none -Foot redressed.  Return in about 1 week (around 08/20/2017) for Post-op.

## 2017-09-18 ENCOUNTER — Ambulatory Visit (INDEPENDENT_AMBULATORY_CARE_PROVIDER_SITE_OTHER): Payer: Medicare Other | Admitting: Podiatry

## 2017-09-18 ENCOUNTER — Encounter: Payer: Self-pay | Admitting: Podiatry

## 2017-09-18 DIAGNOSIS — Z9889 Other specified postprocedural states: Secondary | ICD-10-CM

## 2017-10-01 ENCOUNTER — Other Ambulatory Visit: Payer: Self-pay | Admitting: Family Medicine

## 2017-10-01 DIAGNOSIS — Z139 Encounter for screening, unspecified: Secondary | ICD-10-CM

## 2017-10-02 ENCOUNTER — Ambulatory Visit: Payer: Medicare Other | Admitting: Orthotics

## 2017-10-02 DIAGNOSIS — M79671 Pain in right foot: Secondary | ICD-10-CM

## 2017-10-02 DIAGNOSIS — M898X7 Other specified disorders of bone, ankle and foot: Secondary | ICD-10-CM

## 2017-10-02 DIAGNOSIS — Z9889 Other specified postprocedural states: Secondary | ICD-10-CM

## 2017-10-03 ENCOUNTER — Telehealth: Payer: Self-pay | Admitting: Podiatry

## 2017-10-03 NOTE — Progress Notes (Signed)
Patient has severe cavus deformity RIGHT; however, insurance will be verified before ordering [place].  Measurements however and casting was done.

## 2017-10-03 NOTE — Telephone Encounter (Signed)
Left message for pt to call me to discuss custom shoes coverage by insurance. Not covered unless diabetic follows same guidelines as medicare.

## 2017-10-05 NOTE — Progress Notes (Signed)
  Subjective:  Patient ID: Sarah [name], female    DOB: [birth year],  MRN: [id number]  No chief complaint on file.   DOS: 07/23/17 Procedure: Left fifth metatarsal head resection, left fifth met base exostectomy  66 y.o. female returns for post-op check. Denies N/V/F/Ch.  Doing well having no pain at the fifth metatarsal head area still having pain and callus formation at the fifth metatarsal base.  Objective:   General AA&O x3. Normal mood and affect.  Vascular Foot warm and well perfused.  Neurologic Gross sensation intact.  Dermatologic  skin well-healed.  Continued callus formation at the fifth metatarsal base  Orthopedic: Tenderness to palpation noted about the surgical site.    Assessment & Plan:  Patient was evaluated and treated and all questions answered.  S/p left fifth metatarsal head resection, fifth met base exostectomy -Continued pain and callus formation.  Patient would benefit from custom shoes.  Consulted with orthotist for pocket.  Will proceed with fabrication of custom shoes.  Have patient follow-up in about 2 months  Return in about 2 months (around 11/19/2017).

## 2017-10-06 NOTE — Telephone Encounter (Signed)
Called pt again and gave her information about custom shoes not going to be covered and it would be 450.00 per Sarah [name]. Pt asked if she could do payments and I told her we would ask for about 200.00 down and she could set up payment plan after that. Pt stated she would not have the down  Payment until after the 3rd of jan. But would like to proceed with ordering the custom shoes.Marland Kitchen

## 2017-10-20 ENCOUNTER — Other Ambulatory Visit: Payer: Self-pay | Admitting: *Deleted

## 2017-10-21 NOTE — Telephone Encounter (Signed)
Jazmin,  Would you check with patient which pharmacy she wants [place] to use before I approved her meds.  Thank you   Marjie Skiff, MD [place], PGY-2

## 2017-10-22 MED ORDER — AMLODIPINE BESYLATE 10 MG PO TABS: 10.0000 mg | ORAL_TABLET | Freq: Every day | ORAL | 0 refills | Status: DC

## 2017-10-22 NOTE — Telephone Encounter (Signed)
Patient confirmed that [facility] uses [facility] as her [facility] order. Jazmin Hartsell,CMA

## 2017-10-29 ENCOUNTER — Telehealth: Payer: Self-pay | Admitting: Podiatry

## 2017-10-29 NOTE — Telephone Encounter (Signed)
Pt said her foot is still swollen/painful, just wanted to speak with you about what to do.

## 2017-11-05 ENCOUNTER — Ambulatory Visit
Admission: RE | Admit: 2017-11-05 | Discharge: 2017-11-05 | Disposition: A | Discharge status: Home / Self Care | Payer: Medicare Other | Source: Ambulatory Visit | Attending: Family Medicine | Admitting: Family Medicine

## 2017-11-05 DIAGNOSIS — Z1231 Encounter for screening mammogram for malignant neoplasm of breast: Secondary | ICD-10-CM | POA: Diagnosis not present

## 2017-11-05 DIAGNOSIS — Z139 Encounter for screening, unspecified: Secondary | ICD-10-CM

## 2017-11-06 ENCOUNTER — Other Ambulatory Visit: Payer: Self-pay | Admitting: Family Medicine

## 2017-11-06 DIAGNOSIS — R928 Other abnormal and inconclusive findings on diagnostic imaging of breast: Secondary | ICD-10-CM

## 2017-11-11 ENCOUNTER — Ambulatory Visit
Admission: RE | Admit: 2017-11-11 | Discharge: 2017-11-11 | Disposition: A | Discharge status: Home / Self Care | Payer: Medicare Other | Source: Ambulatory Visit | Attending: Family Medicine | Admitting: Family Medicine

## 2017-11-11 DIAGNOSIS — R928 Other abnormal and inconclusive findings on diagnostic imaging of breast: Secondary | ICD-10-CM

## 2017-11-11 DIAGNOSIS — N6489 Other specified disorders of breast: Secondary | ICD-10-CM | POA: Diagnosis not present

## 2017-11-17 NOTE — Telephone Encounter (Signed)
VM left informing pt to call the office to speak with me to schedule apt.

## 2017-11-17 NOTE — Telephone Encounter (Signed)
Page,   Could you let patient know that she needs to call the clinic and schedule an endometrial biopsy with our gyn clinic.   Thanks  Marjie Skiff, MD [place], PGY-2

## 2017-11-18 NOTE — Telephone Encounter (Signed)
Scheduled for 2/21 @10pm  for colpo clinic.

## 2017-11-19 ENCOUNTER — Ambulatory Visit (INDEPENDENT_AMBULATORY_CARE_PROVIDER_SITE_OTHER): Payer: Medicare Other | Admitting: Podiatry

## 2017-11-19 DIAGNOSIS — M779 Enthesopathy, unspecified: Secondary | ICD-10-CM

## 2017-11-20 ENCOUNTER — Other Ambulatory Visit: Payer: Self-pay | Admitting: Family Medicine

## 2017-11-20 ENCOUNTER — Ambulatory Visit: Payer: Medicare Other | Admitting: Podiatry

## 2017-11-20 NOTE — Progress Notes (Signed)
  Subjective:  Patient ID: Sarah [name], female    DOB: Sep 29, 1951,  MRN: [id number]  Chief Complaint  Patient presents with  . Foot Pain    patient has been wearing her boot again, cannot walk in regular shoes   67 y.o. female returns for the above complaint.  Still having difficulty wearing regular shoes.  Has been back to wearing her cam boot.  States that after her last visit the pain went away however once the callus came back she had her pain back.  Discussed with patient at previous visit custom shoes however they would not be covered by her insurance.  Objective:  There were no vitals filed for this visit. General AA&O x3. Normal mood and affect.  Vascular Pedal pulses palpable.  Neurologic Epicritic sensation grossly intact.  Dermatologic No open lesions. Skin normal texture and turgor.  Left fifth metatarsal base hyperkeratotic lesion with pain to palpation  Orthopedic: No pain to palpation either foot.  No pain to palpation at the left fifth met head area.  Pain to palpation left fifth met base.  Severe adductovarus foot deformity.   Assessment & Plan:  Patient was evaluated and treated and all questions answered.  Left fifth met styloid process capsulitis with hyperkeratotic lesion -Lesion debrided for patient courtesy. -Could not afford custom shoes.  But states that she is looking into saving money for it. -Injection delivered to the fifth met base capsule as below  Procedure: Joint Injection Location: Left 5th met base capsule Skin Prep: Alcohol. Injectate: 0.5 cc 1% lidocaine plain, 0.5 cc dexamethasone phosphate. Disposition: Patient tolerated procedure well. Injection site dressed with a band-aid.  Return in about 4 weeks (around 12/17/2017) for Capsulitis.

## 2017-11-21 ENCOUNTER — Telehealth: Payer: Self-pay | Admitting: Podiatry

## 2017-11-21 NOTE — Telephone Encounter (Signed)
Pt came in my office this week asking about her diabetic shoes and if she had to pay. I misunderstood her and told her that medicare covers 80% of diabetic shoes and we would bill insurance and the 20% would be her responsibility. I scheduled her for 2.22.19 to pu shoes.  After looking into chart she is not a diabetic and shoes are not covered.  I contacted pt and told her that her shoes are not covered and she was previously told that she would have to pay at least  200.00 down when she picks them up and total cost was 450.00.Marland Kitchen Pt stated she was told total cost was 400.00.

## 2017-11-27 ENCOUNTER — Other Ambulatory Visit: Payer: Self-pay

## 2017-11-27 ENCOUNTER — Ambulatory Visit (INDEPENDENT_AMBULATORY_CARE_PROVIDER_SITE_OTHER): Payer: Medicare Other | Admitting: Family Medicine

## 2017-11-27 DIAGNOSIS — N950 Postmenopausal bleeding: Secondary | ICD-10-CM | POA: Diagnosis not present

## 2017-11-27 NOTE — Progress Notes (Addendum)
   [facility] Phone: [phone]   Date of Visit: 11/27/2017   HPI:  Postmenopausal Bleeding:  - patient was seen in clinic on 07/07/17 for abnormal vaginal bleeding.  - she went through menopause around [age]e 56, and has not had a period in about 10 years.  - she started to have spotting about 6 months ago which lasted for about 1 week . In October she reports of having a "full period" which lasted about 10 days - she had a pelvic ultrasound in 07/11/2017 which showed Mildly thickened endometrium measuring up to 5.5 mm. - her last pap was 07/07/17 which was normal.  - she presents today for an endometrial biopsy. Reports she started having spotting again last night and today   ROS: See HPI.  [place]:  PMH: HTN, OA, HLD, Obesity   PHYSICAL EXAM: BP (!) 152/80   Pulse 64   Temp 99 F (37.2 C) (Oral)   Wt 205 lb (93 kg)   SpO2 98%   BMI 34.11 kg/m  Female genitalia: normal external genitalia, vulva, vagina, cervix  PROCEDURE NOTE: Endometrial Biopsy Patient given informed consent, signed copy in the chart.  Appropriate time out taken.  The patient was placed in the lithotomy position and the cervix brought into view with sterile speculum.  The portio of cervix was cleansed x 3 with betadine swabs.  A tenaculum was placed in the anterior lip of the cervix.  A uterine sound was used to measure the uterus, however it was not able to be passed past the cervix. Therefore the procedure was discontinued. All equipment was removed and accounted for.  Minimal spotting type bleeding.     ASSESSMENT/PLAN:  1. Postmenopausal bleeding Unable to pass the uterine sound through the cervix. Therefore procedure was discontinued. Referral to gynecology made for another attempt at endometrial biopsy.  - Ambulatory referral to Gynecology  Smiley Houseman, MD PGY [place]

## 2017-11-27 NOTE — Patient Instructions (Signed)
We were not able to complete the biopsy here. Therefore we referred you to the GYN doctors. They will call you to get this scheduled.

## 2017-11-28 ENCOUNTER — Ambulatory Visit: Payer: Medicare Other | Admitting: Orthotics

## 2017-11-28 DIAGNOSIS — M898X7 Other specified disorders of bone, ankle and foot: Secondary | ICD-10-CM

## 2017-11-28 DIAGNOSIS — M779 Enthesopathy, unspecified: Secondary | ICD-10-CM

## 2017-11-28 NOTE — Progress Notes (Signed)
Patient came in today to try on her custom shoes manufactured in accordance to her left deformity.   The shoes fit very well and accommodated deformity perfectly.   Patient will return next week when she gets paid and pay for the shoes $400.

## 2017-12-03 ENCOUNTER — Encounter: Payer: Self-pay | Admitting: Obstetrics and Gynecology

## 2017-12-05 DIAGNOSIS — M79676 Pain in unspecified toe(s): Secondary | ICD-10-CM

## 2017-12-16 DIAGNOSIS — I10 Essential (primary) hypertension: Secondary | ICD-10-CM | POA: Diagnosis not present

## 2017-12-16 DIAGNOSIS — Z13228 Encounter for screening for other metabolic disorders: Secondary | ICD-10-CM | POA: Diagnosis not present

## 2017-12-16 DIAGNOSIS — Z79899 Other long term (current) drug therapy: Secondary | ICD-10-CM | POA: Diagnosis not present

## 2017-12-18 ENCOUNTER — Ambulatory Visit (INDEPENDENT_AMBULATORY_CARE_PROVIDER_SITE_OTHER): Payer: Medicare Other | Admitting: Podiatry

## 2017-12-18 DIAGNOSIS — M779 Enthesopathy, unspecified: Secondary | ICD-10-CM

## 2017-12-18 DIAGNOSIS — M898X7 Other specified disorders of bone, ankle and foot: Secondary | ICD-10-CM

## 2017-12-25 ENCOUNTER — Ambulatory Visit: Payer: Medicare Other | Admitting: Orthotics

## 2017-12-25 DIAGNOSIS — M79671 Pain in right foot: Secondary | ICD-10-CM

## 2017-12-25 DIAGNOSIS — Z9889 Other specified postprocedural states: Secondary | ICD-10-CM

## 2017-12-25 DIAGNOSIS — M898X7 Other specified disorders of bone, ankle and foot: Secondary | ICD-10-CM

## 2017-12-25 NOTE — Progress Notes (Signed)
Added PPT padding to lateral aspect of foot orthotic; also valgus wedge, offload cuboid, FF and RF.Marland Kitchen This done to offset lateral loading in gait.

## 2017-12-31 DIAGNOSIS — I10 Essential (primary) hypertension: Secondary | ICD-10-CM | POA: Diagnosis not present

## 2017-12-31 DIAGNOSIS — E119 Type 2 diabetes mellitus without complications: Secondary | ICD-10-CM | POA: Diagnosis not present

## 2017-12-31 DIAGNOSIS — E039 Hypothyroidism, unspecified: Secondary | ICD-10-CM | POA: Diagnosis not present

## 2018-01-02 ENCOUNTER — Ambulatory Visit (INDEPENDENT_AMBULATORY_CARE_PROVIDER_SITE_OTHER): Payer: Medicare Other | Admitting: Obstetrics and Gynecology

## 2018-01-02 ENCOUNTER — Encounter: Payer: Self-pay | Admitting: Obstetrics and Gynecology

## 2018-01-02 ENCOUNTER — Other Ambulatory Visit (HOSPITAL_COMMUNITY)
Admission: RE | Admit: 2018-01-02 | Discharge: 2018-01-02 | Disposition: A | Discharge status: Home / Self Care | Payer: Medicare Other | Source: Ambulatory Visit | Attending: Obstetrics and Gynecology | Admitting: Obstetrics and Gynecology

## 2018-01-02 VITALS — BP 143/72 | HR 93 | Ht 65.0 in | Wt 205.0 lb

## 2018-01-02 DIAGNOSIS — N840 Polyp of corpus uteri: Secondary | ICD-10-CM | POA: Diagnosis not present

## 2018-01-02 DIAGNOSIS — N950 Postmenopausal bleeding: Secondary | ICD-10-CM | POA: Diagnosis present

## 2018-01-02 DIAGNOSIS — D251 Intramural leiomyoma of uterus: Secondary | ICD-10-CM | POA: Insufficient documentation

## 2018-01-02 NOTE — Patient Instructions (Signed)
Endometrial Biopsy, Care After This sheet gives you information about how to care for yourself after your procedure. Your health care provider may also give you more specific instructions. If you have problems or questions, contact your health care provider. What can I expect after the procedure? After the procedure, it is common to have:  Mild cramping.  A small amount of vaginal bleeding for a few days. This is normal.  Follow these instructions at home:  Take over-the-counter and prescription medicines only as told by your health care provider.  Do not douche, use tampons, or have sexual intercourse until your health care provider approves.  Return to your normal activities as told by your health care provider. Ask your health care provider what activities are safe for you.  Follow instructions from your health care provider about any activity restrictions, such as restrictions on strenuous exercise or heavy lifting. Contact a health care provider if:  You have heavy bleeding, or bleed for longer than 2 days after the procedure.  You have bad smelling discharge from your vagina.  You have a fever or chills.  You have a burning sensation when urinating or you have difficulty urinating.  You have severe pain in your lower abdomen. Get help right away if:  You have severe cramps in your stomach or back.  You pass large blood clots.  Your bleeding increases.  You become weak or light-headed, or you pass out. Summary  After the procedure, it is common to have mild cramping and a small amount of vaginal bleeding for a few days.  Do not douche, use tampons, or have sexual intercourse until your health care provider approves.  Return to your normal activities as told by your health care provider. Ask your health care provider what activities are safe for you. This information is not intended to replace advice given to you by your health care provider. Make sure you discuss any  questions you have with your health care provider. Document Released: 07/14/2013 Document Revised: 10/09/2016 Document Reviewed: 10/09/2016 Elsevier Interactive Patient Education  2017 Elsevier Inc.   Postmenopausal Bleeding Postmenopausal bleeding is any bleeding after menopause. Menopause is when a woman's period stops. Any type of bleeding after menopause is concerning. It should be checked by your doctor. Any treatment will depend on the cause. Follow these instructions at home: Watch your condition for any changes.  Avoid the use of tampons and douches as told by your doctor.  Change your pads often.  Get regular pelvic exams and Pap tests.  Keep all appointments for tests as told by your doctor.  Contact a doctor if:  Your bleeding lasts for more than 1 week.  You have belly (abdominal) pain.  You have bleeding after sex (intercourse). Get help right away if:  You have a fever, chills, a headache, dizziness, muscle aches, and bleeding.  You have strong pain with bleeding.  You have clumps of blood (blood clots) coming from your vagina.  You have bleeding and need more than 1 pad an hour.  You feel like you are going to pass out (faint). This information is not intended to replace advice given to you by your health care provider. Make sure you discuss any questions you have with your health care provider. Document Released: 07/02/2008 Document Revised: 02/29/2016 Document Reviewed: 04/22/2013 Elsevier Interactive Patient Education  2017 Elsevier Inc.  

## 2018-01-02 NOTE — Progress Notes (Signed)
Sarah [name] is 67 yo G3P3 referred by [facility] for eval of PMB. Pt reports menopause @ 53-58 yrs of age. No bleeding until last summer/fall. She has had some degree of spotting each month since except this month. She was seen by [facility] and had an U/S which revealed several intramural fibroids and 5.5 mm endometrium. FP attempted [facility] but was unable to perform d/t stenotic os.  Pt not sexual active G3P3 TSVD x 3 (largest @ 8 #)  PE AF VSS Lungs clear Heart RRR Abd soft + BS obese GU nl EGBUS, vaginal mucosa pale, cervix no lesions, uterus enlarged 10-12 weeks, no adnexal masses  Endometrial Bx: Written informed consent obtained. Pt placed in DL position. Speculum placed. Cervix cleaned with Betadine. Ant lip grasped with tenaculum. Os dilated with yellow dilators. Pipelle passed without problems. Tissue obtained. Pt tolerated well. All instrutments removed. Specimen to pathology   A/P PMB        Uterine fibroids  PMB reviewed with pt. EMBX completed today. Further Tx pending Bx. Results.

## 2018-01-03 NOTE — Progress Notes (Signed)
  Subjective:  Patient ID: Sarah [name], female    DOB: 1951-02-08,  MRN: [id number]  No chief complaint on file.  67 y.o. female returns for the above complaint.  Got her custom shoes and likes them. Still having some pain from the callus. Denies new issues.  Objective:  There were no vitals filed for this visit. General AA&O x3. Normal mood and affect.  Vascular Pedal pulses palpable.  Neurologic Epicritic sensation grossly intact.  Dermatologic No open lesions. Skin normal texture and turgor.  Left fifth metatarsal base hyperkeratotic lesion with pain to palpation  Orthopedic: No pain to palpation either foot.  No pain to palpation at the left fifth met head area.  Pain to palpation left fifth met base.  Severe adductovarus foot deformity.   Assessment & Plan:  Patient was evaluated and treated and all questions answered.  Left fifth met styloid process capsulitis with hyperkeratotic lesion -Lesion debrided for patient courtesy. -Continue custom shoes.  -Injection offered, declined.  Return in about 6 weeks (around 01/29/2018) for Capsulitis.

## 2018-01-05 ENCOUNTER — Encounter: Payer: Self-pay | Admitting: *Deleted

## 2018-01-15 ENCOUNTER — Telehealth: Payer: Self-pay | Admitting: General Practice

## 2018-01-15 NOTE — Telephone Encounter (Signed)
Called patient & informed her of results & options per Dr Rip Harbour. Patient requests hysteroscopy/ D&C to help stop bleeding. Told patient I would let Dr Rip Harbour know. Patient verbalized understanding & had no questions

## 2018-01-15 NOTE — Telephone Encounter (Signed)
-----   Message from Chancy Milroy, MD sent at 01/15/2018  9:43 AM EDT ----- Please let pt know that her EMBX was normal, possible polyp. Can follow bleeding or schedule Hysteroscopy D & C or make appt to discuss treatment options Thanks Legrand Como

## 2018-01-16 ENCOUNTER — Encounter (HOSPITAL_COMMUNITY): Payer: Self-pay

## 2018-01-28 DIAGNOSIS — E039 Hypothyroidism, unspecified: Secondary | ICD-10-CM | POA: Diagnosis not present

## 2018-01-28 DIAGNOSIS — E119 Type 2 diabetes mellitus without complications: Secondary | ICD-10-CM | POA: Diagnosis not present

## 2018-02-13 ENCOUNTER — Other Ambulatory Visit: Payer: Self-pay | Admitting: Family Medicine

## 2018-02-16 ENCOUNTER — Encounter: Payer: Self-pay | Admitting: Family Medicine

## 2018-02-16 ENCOUNTER — Other Ambulatory Visit: Payer: Self-pay

## 2018-02-16 ENCOUNTER — Ambulatory Visit (INDEPENDENT_AMBULATORY_CARE_PROVIDER_SITE_OTHER): Payer: Medicare Other | Admitting: Family Medicine

## 2018-02-16 VITALS — BP 138/82 | HR 54 | Temp 97.7°F | Wt 211.0 lb

## 2018-02-16 DIAGNOSIS — R7303 Prediabetes: Secondary | ICD-10-CM

## 2018-02-16 DIAGNOSIS — R7309 Other abnormal glucose: Secondary | ICD-10-CM

## 2018-02-16 DIAGNOSIS — I10 Essential (primary) hypertension: Secondary | ICD-10-CM | POA: Diagnosis not present

## 2018-02-16 DIAGNOSIS — E785 Hyperlipidemia, unspecified: Secondary | ICD-10-CM | POA: Diagnosis not present

## 2018-02-16 LAB — POCT GLYCOSYLATED HEMOGLOBIN (HGB A1C): Hemoglobin A1C: 6

## 2018-02-16 NOTE — Progress Notes (Signed)
   Subjective:    Patient ID: Sarah [name], female    DOB: 12/03/1950, 67 y.o.   MRN: [id number]   CC: Follow up for prediabetes   HPI: Patient is a 67 yo female who presents today to follow up on her diabetes. Patient reports that she was told she needed to follow up with her primary care physician about her diabetes. Patient was seen at urgent care about a month ago and was then started on Metformin. She take what appears to be 500 mg daily but did not bring medication with her today. Patient denies any polyuria or polydipsia. She has made no changes to her diet and seem to have a regular [place] diet. She is limited on exercise given left foot issue secondary to polio.  Smoking status reviewed   ROS: all other systems were reviewed and are negative other than in the HPI   Past Medical History:  Diagnosis Date  . Arthritis   . Hyperlipidemia   . Hypertension   . Polio 1964    Past Surgical History:  Procedure Laterality Date  . FOOT SURGERY Left 1961   due to polio  . TONSILLECTOMY AND ADENOIDECTOMY      Past medical history, surgical, family, and social history reviewed and updated in the EMR as appropriate.  Objective:  BP 138/82   Pulse (!) 54   Temp 97.7 F (36.5 C) (Oral)   Wt 211 lb (95.7 kg)   SpO2 99%   BMI 35.11 kg/m   Vitals and nursing note reviewed  General: NAD, pleasant, able to participate in exam Cardiac: RRR, normal heart sounds, no murmurs. 2+ radial and PT pulses bilaterally Respiratory: CTAB, normal effort, No wheezes, rales or rhonchi Abdomen: soft, nontender, nondistended, no hepatic or splenomegaly, +BS Extremities: no edema or cyanosis. WWP. Skin: warm and dry, no rashes noted Neuro: alert and oriented x4, no focal deficits Psych: Normal affect and mood   Assessment & Plan:    Prediabetes A1c today is 6.0. It was last measured in July 2018 when it was 5.8. Patient is still in the prediabetes range. She does not require oral agent in my  opinion without any attempt at [facility] first. Discuss changing diet and integrate ways to be more active given limited motility. She will discontinue metformin for the time being. Will recheck A1c in 3 months. Today will check kidney and liver function today. --Order CMP --Discontinue Metformin  HYPERTENSION, BENIGN SYSTEMIC BP today is  138/82. Patient is currently on clonidine, lisinopril and amlodipine for BP control. She also on spironolactone and metoprolol. Will not make any changes to her regimen as she appears to be a history of uncontrolled HTN. It is my first time meeting her as a PCP. I will continue to monitor. --Order CBC, CMP, TSH  HLD (hyperlipidemia) Will order lipid panel today. Continue current regimen with atorvastatin    Marjie Skiff, MD [place] PGY-2

## 2018-02-16 NOTE — Assessment & Plan Note (Signed)
A1c today is 6.0. It was last measured in July 2018 when it was 5.8. Patient is still in the prediabetes range. She does not require oral agent in my opinion without any attempt at [facility] first. Discuss changing diet and integrate ways to be more active given limited motility. She will discontinue metformin for the time being. Will recheck A1c in 3 months. Today will check kidney and liver function today. --Order CMP --Discontinue Metformin

## 2018-02-16 NOTE — Patient Instructions (Addendum)
It was great seeing you today! We have addressed the following issues today  1. Your A1c is 6.0 that means that you are prediabetic. I want you to work on your diet and exercise. You can stop the metformin for now. We will recheck your A1c in 3 months. 2. We will do blood work today and I will follow up with the results.  If we did any lab work today, and the results require attention, either me or my nurse will get in touch with you. If everything is normal, you will get a letter in mail and a message via . If you don't hear from [place] in two weeks, please give [place] a call. Otherwise, we look forward to seeing you again at your next visit. If you have any questions or concerns before then, please call the clinic at [phone].  Please bring all your medications to every doctors visit  Sign up for My Chart to have easy access to your labs results, and communication with your Primary care physician. Please ask Front Desk for some assistance.   Please check-out at the front desk before leaving the clinic.    Take Care,   Dr. Andy Gauss   Diet Recommendations for Diabetes   Starchy (carb) foods: Bread, rice, pasta, potatoes, corn, cereal, grits, crackers, bagels, muffins, all baked goods.  (Fruits, milk, and yogurt also have carbohydrate, but most of these foods will not spike your blood sugar as the starchy foods will.)  A few fruits do cause high blood sugars; use small portions of bananas (limit to 1/2 at a time), grapes, watermelon, oranges, and most tropical fruits.    Protein foods: Meat, fish, poultry, eggs, dairy foods, and beans such as pinto and kidney beans (beans also provide carbohydrate).   1. Eat at least 3 meals and 1-2 snacks per day. Never go more than 4-5 hours while awake without eating. Eat breakfast within the first hour of getting up.   2. Limit starchy foods to TWO per meal and ONE per snack. ONE portion of a starchy  food is equal to the following:   - ONE slice of bread  (or its equivalent, such as half of a hamburger bun).   - 1/2 cup of a "scoopable" starchy food such as potatoes or rice.   - 15 grams of carbohydrate as shown on food label.  3. Include at every meal: a protein food, a carb food, and vegetables and/or fruit.   - Obtain twice the volume of veg's as protein or carbohydrate foods for both lunch and dinner.   - Fresh or frozen veg's are best.   - Keep frozen veg's on hand for a quick vegetable serving.

## 2018-02-16 NOTE — Assessment & Plan Note (Signed)
Will order lipid panel today. Continue current regimen with atorvastatin

## 2018-02-16 NOTE — Assessment & Plan Note (Addendum)
BP today is  138/82. Patient is currently on clonidine, lisinopril and amlodipine for BP control. She also on spironolactone and metoprolol. Will not make any changes to her regimen as she appears to be a history of uncontrolled HTN. It is my first time meeting her as a PCP. I will continue to monitor. --Order CBC, CMP, TSH

## 2018-02-17 LAB — CBC WITH DIFFERENTIAL/PLATELET
Basophils Absolute: 0.1 10*3/uL (ref 0.0–0.2)
Basos: 1 %
EOS (ABSOLUTE): 0.3 10*3/uL (ref 0.0–0.4)
Eos: 7 %
Hematocrit: 39.3 % (ref 34.0–46.6)
Hemoglobin: 13 g/dL (ref 11.1–15.9)
Immature Grans (Abs): 0 10*3/uL (ref 0.0–0.1)
Immature Granulocytes: 0 %
Lymphocytes Absolute: 2.3 10*3/uL (ref 0.7–3.1)
Lymphs: 45 %
MCH: 30.9 pg (ref 26.6–33.0)
MCHC: 33.1 g/dL (ref 31.5–35.7)
MCV: 93 fL (ref 79–97)
Monocytes Absolute: 0.3 10*3/uL (ref 0.1–0.9)
Monocytes: 6 %
Neutrophils Absolute: 2 10*3/uL (ref 1.4–7.0)
Neutrophils: 41 %
Platelets: 234 10*3/uL (ref 150–379)
RBC: 4.21 x10E6/uL (ref 3.77–5.28)
RDW: 13.3 % (ref 12.3–15.4)
WBC: 5 10*3/uL (ref 3.4–10.8)

## 2018-02-17 LAB — TSH: TSH: 3.79 u[IU]/mL (ref 0.450–4.500)

## 2018-02-17 LAB — LIPID PANEL
Chol/HDL Ratio: 3.4 ratio (ref 0.0–4.4)
Cholesterol, Total: 131 mg/dL (ref 100–199)
HDL: 39 mg/dL — ABNORMAL LOW (ref >39)
LDL Calculated: 76 mg/dL (ref 0–99)
Triglycerides: 81 mg/dL (ref 0–149)
VLDL Cholesterol Cal: 16 mg/dL (ref 5–40)

## 2018-02-17 LAB — CMP14+EGFR
ALT: 21 IU/L (ref 0–32)
AST: 21 IU/L (ref 0–40)
Albumin/Globulin Ratio: 1.4 (ref 1.2–2.2)
Albumin: 4.2 g/dL (ref 3.6–4.8)
Alkaline Phosphatase: 84 IU/L (ref 39–117)
BUN/Creatinine Ratio: 13 (ref 12–28)
BUN: 12 mg/dL (ref 8–27)
Bilirubin Total: 0.8 mg/dL (ref 0.0–1.2)
CO2: 25 mmol/L (ref 20–29)
Calcium: 9.4 mg/dL (ref 8.7–10.3)
Chloride: 99 mmol/L (ref 96–106)
Creatinine, Ser: 0.9 mg/dL (ref 0.57–1.00)
GFR calc Af Amer: 78 mL/min/{1.73_m2} (ref >59)
GFR calc non Af Amer: 68 mL/min/{1.73_m2} (ref >59)
Globulin, Total: 3 g/dL (ref 1.5–4.5)
Glucose: 120 mg/dL — ABNORMAL HIGH (ref 65–99)
Potassium: 4.8 mmol/L (ref 3.5–5.2)
Sodium: 139 mmol/L (ref 134–144)
Total Protein: 7.2 g/dL (ref 6.0–8.5)

## 2018-02-19 ENCOUNTER — Other Ambulatory Visit: Payer: Self-pay

## 2018-02-19 ENCOUNTER — Encounter (HOSPITAL_BASED_OUTPATIENT_CLINIC_OR_DEPARTMENT_OTHER): Payer: Self-pay | Admitting: *Deleted

## 2018-02-24 ENCOUNTER — Encounter (HOSPITAL_BASED_OUTPATIENT_CLINIC_OR_DEPARTMENT_OTHER)
Admission: RE | Admit: 2018-02-24 | Discharge: 2018-02-24 | Disposition: A | Discharge status: Home / Self Care | Payer: Medicare Other | Source: Ambulatory Visit | Attending: Obstetrics and Gynecology | Admitting: Obstetrics and Gynecology

## 2018-02-24 ENCOUNTER — Telehealth: Payer: Self-pay | Admitting: Family Medicine

## 2018-02-24 DIAGNOSIS — E669 Obesity, unspecified: Secondary | ICD-10-CM | POA: Diagnosis not present

## 2018-02-24 DIAGNOSIS — B91 Sequelae of poliomyelitis: Secondary | ICD-10-CM | POA: Diagnosis not present

## 2018-02-24 DIAGNOSIS — M1990 Unspecified osteoarthritis, unspecified site: Secondary | ICD-10-CM | POA: Diagnosis not present

## 2018-02-24 DIAGNOSIS — Z6834 Body mass index (BMI) 34.0-34.9, adult: Secondary | ICD-10-CM | POA: Diagnosis not present

## 2018-02-24 DIAGNOSIS — N840 Polyp of corpus uteri: Secondary | ICD-10-CM | POA: Diagnosis not present

## 2018-02-24 DIAGNOSIS — N950 Postmenopausal bleeding: Secondary | ICD-10-CM | POA: Diagnosis not present

## 2018-02-24 DIAGNOSIS — I10 Essential (primary) hypertension: Secondary | ICD-10-CM | POA: Diagnosis not present

## 2018-02-24 DIAGNOSIS — Z880 Allergy status to penicillin: Secondary | ICD-10-CM | POA: Diagnosis not present

## 2018-02-24 DIAGNOSIS — M216X2 Other acquired deformities of left foot: Secondary | ICD-10-CM | POA: Diagnosis not present

## 2018-02-24 DIAGNOSIS — E785 Hyperlipidemia, unspecified: Secondary | ICD-10-CM | POA: Diagnosis not present

## 2018-02-24 NOTE — Telephone Encounter (Signed)
Spoke with PCP and ok to skip 81mg  of aspirin tomm. Pt informed.

## 2018-02-24 NOTE — H&P (Signed)
Sarah [name] is an 67 y.o. female with a H/O PMB. EMBX was normal with possible polyp. U/S uterine fibroids and thicken endometrium.  Management options reviewed with pt. Pt desired possible definite therapy in form of hysteroscopy D & C.    Menstrual History: Menarche age: 27 No LMP recorded. Patient is postmenopausal.    Past Medical History:  Diagnosis Date  . Arthritis   . Hyperlipidemia   . Hypertension   . Polio 1964    Past Surgical History:  Procedure Laterality Date  . FOOT SURGERY Left 1961   due to polio  . TONSILLECTOMY AND ADENOIDECTOMY      Family History  Problem Relation Age of Onset  . Hypertension Mother   . Stroke Mother   . Cancer Father 47       Unknown  . Stroke Sister        multiple  . Hypertension Sister   . Kidney disease Sister   . Diabetes Brother   . Hypertension Sister   . Hypertension Brother   . Kidney disease Son   . Diabetes Son   . Hypertension Daughter     Social History:  reports that she has never smoked. She has never used smokeless tobacco. She reports that she does not drink alcohol or use drugs.  Allergies:  Allergies  Allergen Reactions  . Penicillins Hives and Rash    No medications prior to admission.    Review of Systems  Constitutional: Negative.   Respiratory: Negative.   Cardiovascular: Negative.   Gastrointestinal: Negative.   Genitourinary: Negative.     Height 5\' 5"  (1.651 m), weight 192 lb (87.1 kg). Physical Exam  Constitutional: She appears well-developed and well-nourished.  Cardiovascular: Normal rate and regular rhythm.  Respiratory: Effort normal and breath sounds normal.  GI: Soft. Bowel sounds are normal.  Genitourinary:  Genitourinary Comments: Deferred to OR    No results found for this or any previous visit (from the past 24 hour(s)).  No results found.  Assessment/Plan: PMB Possible endometrium polyp  Pt for Hysteroscopy D & C today. R/B/Post op care reviewed with pt. Pt  verbalized understanding and desires to proceed.   Chancy Milroy 02/24/2018, 10:10 AM

## 2018-02-24 NOTE — Telephone Encounter (Signed)
Pt is having hysterectomy tomorrow at 7am. Her surgeon wants her to find out if she should take her 81 mg aspirin in the morning.

## 2018-02-25 ENCOUNTER — Encounter (HOSPITAL_BASED_OUTPATIENT_CLINIC_OR_DEPARTMENT_OTHER)
Admission: RE | Disposition: A | Discharge status: Home / Self Care | Payer: Self-pay | Source: Ambulatory Visit | Attending: Obstetrics and Gynecology

## 2018-02-25 ENCOUNTER — Ambulatory Visit (HOSPITAL_BASED_OUTPATIENT_CLINIC_OR_DEPARTMENT_OTHER): Payer: Medicare Other | Admitting: Anesthesiology

## 2018-02-25 ENCOUNTER — Other Ambulatory Visit: Payer: Self-pay

## 2018-02-25 ENCOUNTER — Ambulatory Visit (HOSPITAL_BASED_OUTPATIENT_CLINIC_OR_DEPARTMENT_OTHER)
Admission: RE | Admit: 2018-02-25 | Discharge: 2018-02-25 | Disposition: A | Discharge status: Home / Self Care | Payer: Medicare Other | Source: Ambulatory Visit | Attending: Obstetrics and Gynecology | Admitting: Obstetrics and Gynecology

## 2018-02-25 ENCOUNTER — Encounter (HOSPITAL_BASED_OUTPATIENT_CLINIC_OR_DEPARTMENT_OTHER): Payer: Self-pay | Admitting: Anesthesiology

## 2018-02-25 DIAGNOSIS — B91 Sequelae of poliomyelitis: Secondary | ICD-10-CM | POA: Insufficient documentation

## 2018-02-25 DIAGNOSIS — Z880 Allergy status to penicillin: Secondary | ICD-10-CM | POA: Insufficient documentation

## 2018-02-25 DIAGNOSIS — N950 Postmenopausal bleeding: Secondary | ICD-10-CM | POA: Insufficient documentation

## 2018-02-25 DIAGNOSIS — M1990 Unspecified osteoarthritis, unspecified site: Secondary | ICD-10-CM | POA: Diagnosis not present

## 2018-02-25 DIAGNOSIS — Z6834 Body mass index (BMI) 34.0-34.9, adult: Secondary | ICD-10-CM | POA: Insufficient documentation

## 2018-02-25 DIAGNOSIS — E785 Hyperlipidemia, unspecified: Secondary | ICD-10-CM | POA: Insufficient documentation

## 2018-02-25 DIAGNOSIS — I10 Essential (primary) hypertension: Secondary | ICD-10-CM | POA: Diagnosis not present

## 2018-02-25 DIAGNOSIS — M216X2 Other acquired deformities of left foot: Secondary | ICD-10-CM | POA: Insufficient documentation

## 2018-02-25 DIAGNOSIS — N840 Polyp of corpus uteri: Secondary | ICD-10-CM

## 2018-02-25 DIAGNOSIS — E669 Obesity, unspecified: Secondary | ICD-10-CM | POA: Insufficient documentation

## 2018-02-25 HISTORY — PX: HYSTEROSCOPY WITH D & C: SHX1775

## 2018-02-25 LAB — CBC
HCT: 39.7 % (ref 36.0–46.0)
Hemoglobin: 12.8 g/dL (ref 12.0–15.0)
MCH: 30.5 pg (ref 26.0–34.0)
MCHC: 32.2 g/dL (ref 30.0–36.0)
MCV: 94.5 fL (ref 78.0–100.0)
Platelets: 206 10*3/uL (ref 150–400)
RBC: 4.2 MIL/uL (ref 3.87–5.11)
RDW: 11.9 % (ref 11.5–15.5)
WBC: 5.8 10*3/uL (ref 4.0–10.5)

## 2018-02-25 SURGERY — DILATATION AND CURETTAGE /HYSTEROSCOPY
Anesthesia: General | Site: Uterus

## 2018-02-25 MED ORDER — LACTATED RINGERS IV SOLN
INTRAVENOUS | Status: DC
  Administered 2018-02-25 (×2): via INTRAVENOUS

## 2018-02-25 MED ORDER — FENTANYL CITRATE (PF) 100 MCG/2ML IJ SOLN
INTRAMUSCULAR | Status: DC | PRN
  Administered 2018-02-25: 50 ug via INTRAVENOUS

## 2018-02-25 MED ORDER — PHENYLEPHRINE 40 MCG/ML (10ML) SYRINGE FOR IV PUSH (FOR BLOOD PRESSURE SUPPORT)
PREFILLED_SYRINGE | INTRAVENOUS | Status: DC | PRN
  Administered 2018-02-25: 80 ug via INTRAVENOUS

## 2018-02-25 MED ORDER — ONDANSETRON HCL 4 MG/2ML IJ SOLN
INTRAMUSCULAR | Status: DC | PRN
  Administered 2018-02-25: 4 mg via INTRAVENOUS

## 2018-02-25 MED ORDER — OXYCODONE HCL 5 MG PO TABS: 5.0000 mg | ORAL_TABLET | Freq: Four times a day (QID) | ORAL | 0 refills | Status: DC | PRN

## 2018-02-25 MED ORDER — SOD CITRATE-CITRIC ACID 500-334 MG/5ML PO SOLN
30.0000 mL | ORAL | Status: AC
  Administered 2018-02-25: 30 mL via ORAL

## 2018-02-25 MED ORDER — PHENYLEPHRINE 40 MCG/ML (10ML) SYRINGE FOR IV PUSH (FOR BLOOD PRESSURE SUPPORT)
PREFILLED_SYRINGE | INTRAVENOUS | Status: AC
  Filled 2018-02-25: qty 10

## 2018-02-25 MED ORDER — SCOPOLAMINE 1 MG/3DAYS TD PT72: 1.0000 | MEDICATED_PATCH | Freq: Once | TRANSDERMAL | Status: DC | PRN

## 2018-02-25 MED ORDER — LIDOCAINE 2% (20 MG/ML) 5 ML SYRINGE
INTRAMUSCULAR | Status: DC | PRN
  Administered 2018-02-25: 60 mg via INTRAVENOUS

## 2018-02-25 MED ORDER — EPHEDRINE SULFATE-NACL 50-0.9 MG/10ML-% IV SOSY
PREFILLED_SYRINGE | INTRAVENOUS | Status: DC | PRN
  Administered 2018-02-25: 5 mg via INTRAVENOUS

## 2018-02-25 MED ORDER — METOCLOPRAMIDE HCL 5 MG/ML IJ SOLN: 10.0000 mg | Freq: Once | INTRAMUSCULAR | Status: DC | PRN

## 2018-02-25 MED ORDER — IBUPROFEN 800 MG PO TABS
800.0000 mg | ORAL_TABLET | Freq: Three times a day (TID) | ORAL | 0 refills | Status: DC | PRN
Start: 2018-02-25 — End: 2018-11-14

## 2018-02-25 MED ORDER — ONDANSETRON HCL 4 MG/2ML IJ SOLN
INTRAMUSCULAR | Status: AC
  Filled 2018-02-25: qty 2

## 2018-02-25 MED ORDER — PROPOFOL 10 MG/ML IV BOLUS
INTRAVENOUS | Status: AC
  Filled 2018-02-25: qty 20

## 2018-02-25 MED ORDER — DEXAMETHASONE SODIUM PHOSPHATE 10 MG/ML IJ SOLN
INTRAMUSCULAR | Status: AC
  Filled 2018-02-25: qty 1

## 2018-02-25 MED ORDER — EPHEDRINE SULFATE 50 MG/ML IJ SOLN
INTRAMUSCULAR | Status: AC
Start: 2018-02-25 — End: ?
  Filled 2018-02-25: qty 1

## 2018-02-25 MED ORDER — KETOROLAC TROMETHAMINE 30 MG/ML IJ SOLN
INTRAMUSCULAR | Status: DC | PRN
  Administered 2018-02-25: 30 mg via INTRAVENOUS

## 2018-02-25 MED ORDER — FENTANYL CITRATE (PF) 100 MCG/2ML IJ SOLN: 25.0000 ug | INTRAMUSCULAR | Status: DC | PRN

## 2018-02-25 MED ORDER — FERRIC SUBSULFATE 259 MG/GM EX SOLN
CUTANEOUS | Status: DC | PRN
  Administered 2018-02-25: 1 via TOPICAL

## 2018-02-25 MED ORDER — MEPERIDINE HCL 25 MG/ML IJ SOLN: 6.2500 mg | INTRAMUSCULAR | Status: DC | PRN

## 2018-02-25 MED ORDER — FENTANYL CITRATE (PF) 100 MCG/2ML IJ SOLN: 50.0000 ug | INTRAMUSCULAR | Status: DC | PRN

## 2018-02-25 MED ORDER — MIDAZOLAM HCL 5 MG/5ML IJ SOLN
INTRAMUSCULAR | Status: DC | PRN
  Administered 2018-02-25: 2 mg via INTRAVENOUS

## 2018-02-25 MED ORDER — MIDAZOLAM HCL 2 MG/2ML IJ SOLN
INTRAMUSCULAR | Status: AC
  Filled 2018-02-25: qty 2

## 2018-02-25 MED ORDER — PROPOFOL 10 MG/ML IV BOLUS
INTRAVENOUS | Status: DC | PRN
  Administered 2018-02-25: 180 mg via INTRAVENOUS

## 2018-02-25 MED ORDER — FENTANYL CITRATE (PF) 100 MCG/2ML IJ SOLN
INTRAMUSCULAR | Status: AC
  Filled 2018-02-25: qty 2

## 2018-02-25 MED ORDER — DEXAMETHASONE SODIUM PHOSPHATE 10 MG/ML IJ SOLN
INTRAMUSCULAR | Status: DC | PRN
  Administered 2018-02-25: 10 mg via INTRAVENOUS

## 2018-02-25 MED ORDER — LACTATED RINGERS IV SOLN: INTRAVENOUS | Status: DC

## 2018-02-25 MED ORDER — LIDOCAINE HCL (CARDIAC) PF 100 MG/5ML IV SOSY
PREFILLED_SYRINGE | INTRAVENOUS | Status: AC
  Filled 2018-02-25: qty 5

## 2018-02-25 MED ORDER — SODIUM CHLORIDE 0.9 % IR SOLN
Status: DC | PRN
  Administered 2018-02-25: 1

## 2018-02-25 MED ORDER — HYDROCODONE-ACETAMINOPHEN 7.5-325 MG PO TABS: 1.0000 | ORAL_TABLET | Freq: Once | ORAL | Status: DC | PRN

## 2018-02-25 MED ORDER — MIDAZOLAM HCL 2 MG/2ML IJ SOLN: 1.0000 mg | INTRAMUSCULAR | Status: DC | PRN

## 2018-02-25 MED ORDER — FERRIC SUBSULFATE 259 MG/GM EX SOLN
CUTANEOUS | Status: AC
  Filled 2018-02-25: qty 8

## 2018-02-25 MED ORDER — SOD CITRATE-CITRIC ACID 500-334 MG/5ML PO SOLN
ORAL | Status: AC
  Filled 2018-02-25: qty 30

## 2018-02-25 SURGICAL SUPPLY — 17 items
BRIEF STRETCH FOR OB PAD XXL (UNDERPADS AND DIAPERS) ×2 IMPLANT
CANISTER AQUILEX FLUID KIT (CANNISTER) ×2 IMPLANT
CATH ROBINSON RED A/P 16FR (CATHETERS) ×2 IMPLANT
DILATOR CANAL MILEX (MISCELLANEOUS) IMPLANT
GLOVE BIO SURGEON STRL SZ7 (GLOVE) ×1 IMPLANT
GLOVE BIO SURGEON STRL SZ7.5 (GLOVE) ×2 IMPLANT
GLOVE BIOGEL PI IND STRL 7.0 (GLOVE) ×1 IMPLANT
GLOVE BIOGEL PI INDICATOR 7.0 (GLOVE) ×1
GOWN STRL REUS W/TWL LRG LVL3 (GOWN DISPOSABLE) ×2 IMPLANT
GOWN STRL REUS W/TWL XL LVL3 (GOWN DISPOSABLE) ×2 IMPLANT
PACK VAGINAL MINOR WOMEN LF (CUSTOM PROCEDURE TRAY) ×2 IMPLANT
PAD OB MATERNITY 4.3X12.25 (PERSONAL CARE ITEMS) ×2 IMPLANT
PAD PREP 24X48 CUFFED NSTRL (MISCELLANEOUS) ×2 IMPLANT
SLEEVE SCD COMPRESS KNEE MED (MISCELLANEOUS) ×2 IMPLANT
TOWEL OR 17X24 6PK STRL BLUE (TOWEL DISPOSABLE) ×4 IMPLANT
TUBING AQUILEX INFLOW (TUBING) ×2 IMPLANT
TUBING AQUILEX OUTFLOW (TUBING) ×2 IMPLANT

## 2018-02-25 NOTE — Anesthesia Procedure Notes (Signed)
Procedure Name: LMA Insertion Date/Time: 02/25/2018 7:36 AM Performed by: Gwyndolyn Saxon, CRNA Pre-anesthesia Checklist: Patient identified, Emergency Drugs available, Suction available, Patient being monitored and Timeout performed Patient Re-evaluated:Patient Re-evaluated prior to induction Oxygen Delivery Method: Circle system utilized Preoxygenation: Pre-oxygenation with 100% oxygen Induction Type: IV induction Ventilation: Mask ventilation with difficulty and Oral airway inserted - appropriate to patient size LMA: LMA with gastric port inserted LMA Size: 4.0 Number of attempts: 2 Placement Confirmation: positive ETCO2,  CO2 detector and breath sounds checked- equal and bilateral Tube secured with: Tape Dental Injury: Teeth and Oropharynx as per pre-operative assessment  Comments: Straight LMA inserted; no EtCO2. Curved LMA with gastric port inseerted; +EtCO2

## 2018-02-25 NOTE — Interval H&P Note (Signed)
History and Physical Interval Note:  02/25/2018 7:16 AM  Sarah [name]  has presented today for surgery, with the diagnosis of PMB  The various methods of treatment have been discussed with the patient and family. After consideration of risks, benefits and other options for treatment, the patient has consented to  Procedure(s): DILATATION AND CURETTAGE /HYSTEROSCOPY (N/A) as a surgical intervention .  The patient's history has been reviewed, patient examined, no change in status, stable for surgery.  I have reviewed the patient's chart and labs.  Questions were answered to the patient's satisfaction.     Chancy Milroy

## 2018-02-25 NOTE — Anesthesia Postprocedure Evaluation (Signed)
Anesthesia Post Note  Patient: COREAN YOSHIMURA  Procedure(s) Performed: DILATATION AND CURETTAGE /HYSTEROSCOPY (N/A Uterus)     Patient location during evaluation: PACU Anesthesia Type: General Level of consciousness: awake and alert and oriented Pain management: pain level controlled Vital Signs Assessment: post-procedure vital signs reviewed and stable Respiratory status: spontaneous breathing, nonlabored ventilation and respiratory function stable Cardiovascular status: blood pressure returned to baseline and stable Postop Assessment: no apparent nausea or vomiting Anesthetic complications: no    Last Vitals:  Vitals:   02/25/18 0846 02/25/18 0900  BP: 137/77 134/81  Pulse: 69 63  Resp: 13 18  Temp:    SpO2: 97% 97%    Last Pain:  Vitals:   02/25/18 0846  TempSrc:   PainSc: 0-No pain                 Jenafer Winterton A.

## 2018-02-25 NOTE — Discharge Instructions (Signed)
Hysteroscopy, Care After Refer to this sheet in the next few weeks. These instructions provide you with information on caring for yourself after your procedure. Your health care provider may also give you more specific instructions. Your treatment has been planned according to current medical practices, but problems sometimes occur. Call your health care provider if you have any problems or questions after your procedure. What can I expect after the procedure? After your procedure, it is typical to have the following:  You may have some cramping. This normally lasts for a couple days.  You may have bleeding. This can vary from light spotting for a few days to menstrual-like bleeding for 3-7 days.  Follow these instructions at home:  Rest for the first 1-2 days after the procedure.  Only take over-the-counter or prescription medicines as directed by your health care provider. Do not take aspirin. It can increase the chances of bleeding.  Take showers instead of baths for 2 weeks or as directed by your health care provider.  Do not drive for 24 hours or as directed.  Do not drink alcohol while taking pain medicine.  Do not use tampons, douche, or have sexual intercourse for 2 weeks or until your health care provider says it is okay.  Take your temperature twice a day for 4-5 days. Write it down each time.  Follow your health care provider's advice about diet, exercise, and lifting.  If you develop constipation, you may: ? Take a mild laxative if your health care provider approves. ? Add bran foods to your diet. ? Drink enough fluids to keep your urine clear or pale yellow.  Try to have someone with you or available to you for the first 24-48 hours, especially if you were given a general anesthetic.  Follow up with your health care provider as directed. Contact a health care provider if:  You feel dizzy or lightheaded.  You feel sick to your stomach (nauseous).  You have  abnormal vaginal discharge.  You have a rash.  You have pain that is not controlled with medicine. Get help right away if:  You have bleeding that is heavier than a normal menstrual period.  You have a fever.  You have increasing cramps or pain, not controlled with medicine.  You have new belly (abdominal) pain.  You pass out.  You have pain in the tops of your shoulders (shoulder strap areas).  You have shortness of breath. This information is not intended to replace advice given to you by your health care provider. Make sure you discuss any questions you have with your health care provider. Document Released: 07/14/2013 Document Revised: 02/29/2016 Document Reviewed: 04/22/2013 Elsevier Interactive Patient Education  2017 [place] [facility] Instructions  Activity: Get plenty of rest for the remainder of the day. A responsible individual must stay with you for 24 hours following the procedure.  For the next 24 hours, DO NOT: -Drive a car -Paediatric nurse -Drink alcoholic beverages -Take any medication unless instructed by your physician -Make any legal decisions or sign important papers.  Meals: Start with liquid foods such as gelatin or soup. Progress to regular foods as tolerated. Avoid greasy, spicy, heavy foods. If nausea and/or vomiting occur, drink only clear liquids until the nausea and/or vomiting subsides. Call your physician if vomiting continues.  Special Instructions/Symptoms: Your throat may feel dry or sore from the anesthesia or the breathing tube placed in your throat during surgery. If this causes discomfort, gargle  with warm salt water. The discomfort should disappear within 24 hours. ° °If you had a scopolamine patch placed behind your ear for the management of post- operative nausea and/or vomiting: ° °1. The medication in the patch is effective for 72 hours, after which it should be removed.  Wrap patch in a tissue and discard in  the trash. Wash hands thoroughly with soap and water. °2. You may remove the patch earlier than 72 hours if you experience unpleasant side effects which may include dry mouth, dizziness or visual disturbances. °3. Avoid touching the patch. Wash your hands with soap and water after contact with the patch. °  ° ° °

## 2018-02-25 NOTE — Transfer of Care (Signed)
Immediate Anesthesia Transfer of Care Note  Patient: Sarah [name]  Procedure(s) Performed: DILATATION AND CURETTAGE /HYSTEROSCOPY (N/A Uterus)  Patient Location: PACU  Anesthesia Type:General  Level of Consciousness: drowsy  Airway & Oxygen Therapy: Patient Spontanous Breathing and Patient connected to face mask oxygen  Post-op Assessment: Report given to RN and Post -op Vital signs reviewed and stable  Post vital signs: Reviewed and stable  Last Vitals:  Vitals Value Taken Time  BP 153/84 02/25/2018  8:30 AM  Temp    Pulse 73 02/25/2018  8:32 AM  Resp 21 02/25/2018  8:32 AM  SpO2 100 % 02/25/2018  8:32 AM  Vitals shown include unvalidated device data.  Last Pain:  Vitals:   02/25/18 0645  TempSrc: Oral  PainSc: 7       Patients Stated Pain Goal: 5 (28/78/67 6720)  Complications: No apparent anesthesia complications

## 2018-02-25 NOTE — Op Note (Signed)
PREOPERATIVE DIAGNOSIS:  Postmenopausal bleeding, probable endometrial polyp POSTOPERATIVE DIAGNOSIS: The same PROCEDURE: Hysteroscopy, Dilation and Curettage. SURGEON:  Dr. Arlina Robes   INDICATIONS: 67 y.o. No obstetric history on file.  here for scheduled surgery for the aforementioned diagnoses.   Risks of surgery were discussed with the patient including but not limited to: bleeding which may require transfusion; infection which may require antibiotics; injury to uterus or surrounding organs; intrauterine scarring which may impair future fertility; need for additional procedures including laparotomy or laparoscopy; and other postoperative/anesthesia complications. Written informed consent was obtained.    FINDINGS:  A 8 week size uterus.  Atrophic endometrium with polyp Normal ostia bilaterally.  ANESTHESIA:   LMA INTRAVENOUS FLUIDS:  As recorded FLUID DEFICITS:  125 cc saline ESTIMATED BLOOD LOSS:  Less than 5 ml SPECIMENS: Endometrial curettings sent to pathology COMPLICATIONS:  None immediate.  PROCEDURE DETAILS:  The patient was taken to the operating room where anesthesia was administered and was found to be adequate.  After an adequate timeout was performed, she was placed in the dorsal lithotomy position and examined; then prepped and draped in the sterile manner.   Her bladder was catheterized for an unmeasured amount of clear, yellow urine. A speculum was then placed in the patient's vagina and a single tooth tenaculum was applied to the anterior lip of the cervix.   The cervix was sounded to 8 cm and dilated manually with metal dilators to accommodate the 5 mm diagnostic hysteroscope.  Once the cervix was dilated, the hysteroscope was inserted under direct visualization using saline as a suspension medium.  The uterine cavity was carefully examined with the findings as noted above.   After further careful visualization of the uterine cavity, the hysteroscope was removed under direct  visualization.  A sharp curettage was then performed to obtain a moderate amount of endometrial curettings and polyp. The hysteroscope was reintroduced into the uterine cavity which demonstrated no evidence of remaining polyp.   The tenaculum was removed from the anterior lip of the cervix and the vaginal speculum was removed after noting good hemostasis.  The patient tolerated the procedure well and was taken to the recovery area awake, extubated and in stable condition.  The patient will be discharged to home as per PACU criteria.  Routine postoperative instructions given.  She was prescribed Motrin and Percocet. She will follow up in the clinic in 3-4 weeks.

## 2018-02-25 NOTE — Anesthesia Preprocedure Evaluation (Addendum)
Anesthesia Evaluation  Patient identified by MRN, date of birth, ID band Patient awake    Reviewed: Allergy & Precautions, NPO status , Patient's Chart, lab work & pertinent test results, reviewed documented beta blocker date and time   Airway Mallampati: II  TM Distance: >3 FB Neck ROM: Full    Dental  (+) Poor Dentition, Edentulous Upper, Missing, Dental Advisory Given,    Pulmonary neg pulmonary ROS,    Pulmonary exam normal breath sounds clear to auscultation       Cardiovascular hypertension, Pt. on medications and Pt. on home beta blockers Normal cardiovascular exam Rhythm:Regular Rate:Normal     Neuro/Psych Hx/o Polio 1964  Neuromuscular disease negative psych ROS   GI/Hepatic negative GI ROS, Neg liver ROS,   Endo/Other  Obesity Hyperlipidemia  Renal/GU   negative genitourinary   Musculoskeletal  (+) Arthritis , Osteoarthritis,  Deformity of left foot and ankle due to polio   Abdominal (+) + obese,   Peds  Hematology negative hematology ROS (+)   Anesthesia Other Findings   Reproductive/Obstetrics Post menopausal bleeding Endometrial polyp                           Anesthesia Physical Anesthesia Plan  ASA: II  Anesthesia Plan: General   Post-op Pain Management:    Induction: Intravenous  PONV Risk Score and Plan: 4 or greater and Midazolam, Dexamethasone, Ondansetron and Treatment may vary due to age or medical condition  Airway Management Planned: LMA  Additional Equipment:   Intra-op Plan:   Post-operative Plan: Extubation in OR  Informed Consent: I have reviewed the patients History and Physical, chart, labs and discussed the procedure including the risks, benefits and alternatives for the proposed anesthesia with the patient or authorized representative who has indicated his/her understanding and acceptance.   Dental advisory given  Plan Discussed with: CRNA,  Anesthesiologist and Surgeon  Anesthesia Plan Comments:         Anesthesia Quick Evaluation

## 2018-02-26 ENCOUNTER — Encounter (HOSPITAL_BASED_OUTPATIENT_CLINIC_OR_DEPARTMENT_OTHER): Payer: Self-pay | Admitting: Obstetrics and Gynecology

## 2018-03-12 ENCOUNTER — Ambulatory Visit: Payer: Medicare Other | Admitting: Podiatry

## 2018-03-26 ENCOUNTER — Ambulatory Visit: Payer: Medicare Other | Admitting: Family Medicine

## 2018-03-31 ENCOUNTER — Ambulatory Visit: Payer: Medicare Other | Admitting: Family Medicine

## 2018-04-16 ENCOUNTER — Ambulatory Visit (INDEPENDENT_AMBULATORY_CARE_PROVIDER_SITE_OTHER): Payer: Medicare Other | Admitting: Family Medicine

## 2018-04-16 ENCOUNTER — Other Ambulatory Visit: Payer: Self-pay

## 2018-04-16 ENCOUNTER — Encounter: Payer: Self-pay | Admitting: Family Medicine

## 2018-04-16 VITALS — BP 132/80 | HR 75 | Temp 98.0°F | Wt 205.0 lb

## 2018-04-16 DIAGNOSIS — I10 Essential (primary) hypertension: Secondary | ICD-10-CM

## 2018-04-16 DIAGNOSIS — M159 Polyosteoarthritis, unspecified: Secondary | ICD-10-CM | POA: Diagnosis not present

## 2018-04-16 DIAGNOSIS — E559 Vitamin D deficiency, unspecified: Secondary | ICD-10-CM | POA: Diagnosis not present

## 2018-04-16 DIAGNOSIS — R7303 Prediabetes: Secondary | ICD-10-CM

## 2018-04-16 DIAGNOSIS — Z23 Encounter for immunization: Secondary | ICD-10-CM

## 2018-04-16 MED ORDER — PNEUMOCOCCAL 13-VAL CONJ VACC IM SUSP
0.5000 mL | INTRAMUSCULAR | Status: AC
  Administered 2018-04-16: 0.5 mL via INTRAMUSCULAR

## 2018-04-16 MED ORDER — SPIRONOLACTONE 25 MG PO TABS: 25.0000 mg | ORAL_TABLET | Freq: Every day | ORAL | 3 refills | Status: DC

## 2018-04-16 NOTE — Assessment & Plan Note (Signed)
Patient blood pressure today is 132/80.  BMP at last normal did show normal kidney function.  No red flags.  Patient is at goal.  Will continue current regimen.

## 2018-04-16 NOTE — Assessment & Plan Note (Signed)
Patient had A1c checked at home but insurance it was 6.1.  Patient is prediabetic and we had this discussion at last office visit.  We will not start patient on any medication.  She will continue to make triplicates of changes as a way to control her diabetes.  We will recheck A1c in 2 months.  Kidney and liver function all within normal limits.

## 2018-04-16 NOTE — Progress Notes (Signed)
   Subjective:    Patient ID: Sarah [name], female    DOB: 1951/02/02, 67 y.o.   MRN: [id number]   CC: Follow up on blood pressure and "diabetes"  HPI: Patient is a 67 yo female with a past medical history significant for HTN, prediabetes, hx of polio, HLD who presents today after a home visit nurse told her she needed to see her PCP for blood pressure and diabetes. Patient reports taking all her medications as prescribed. She denies any headaches, vision changes, dizziness, shortness of breath. Patient was also concerned about her A1c of 6.1 and was told she had diabetes. Patient has been making some dietary changes since last office visit after I informed her she was prediabetic. Patient is limited in her exercise because of foot deformation secondary to polio.  Smoking status reviewed   ROS: all other systems were reviewed and are negative other than in the HPI   Past Medical History:  Diagnosis Date  . Arthritis   . Hyperlipidemia   . Hypertension   . Polio 1964    Past Surgical History:  Procedure Laterality Date  . FOOT SURGERY Left 1961   due to polio  . HYSTEROSCOPY W/D&C N/A 02/25/2018   Procedure: DILATATION AND CURETTAGE /HYSTEROSCOPY;  Surgeon: Chancy Milroy, MD;  Location: [facility];  Service: Gynecology;  Laterality: N/A;  . TONSILLECTOMY AND ADENOIDECTOMY      Past medical history, surgical, family, and social history reviewed and updated in the EMR as appropriate.  Objective:  BP 132/80   Pulse 75   Temp 98 F (36.7 C) (Oral)   Wt 205 lb (93 kg)   SpO2 95%   BMI 34.11 kg/m   Vitals and nursing note reviewed  General: NAD, pleasant, able to participate in exam Cardiac: RRR, normal heart sounds, no murmurs. 2+ radial and PT pulses bilaterally Respiratory: CTAB, normal effort, No wheezes, rales or rhonchi Abdomen: soft, nontender, nondistended, no hepatic or splenomegaly, +BS Extremities: no edema or cyanosis. WWP. Skin: warm and dry, no  rashes noted Neuro: alert and oriented x4, no focal deficits Psych: Normal affect and mood   Assessment & Plan:    HYPERTENSION, BENIGN SYSTEMIC Patient blood pressure today is 132/80.  BMP at last normal did show normal kidney function.  No red flags.  Patient is at goal.  Will continue current regimen.  Prediabetes Patient had A1c checked at home but insurance it was 6.1.  Patient is prediabetic and we had this discussion at last office visit.  We will not start patient on any medication.  She will continue to make triplicates of changes as a way to control her diabetes.  We will recheck A1c in 2 months.  Kidney and liver function all within normal limits.    Marjie Skiff, MD [place] PGY-3

## 2018-04-16 NOTE — Patient Instructions (Signed)
It was great seeing you today! We have addressed the following issues today  1. Your BP is well control no change in your medications. 2. Dexa scan order was placed you can go to the breast center. 3. I will check a vitamin D level. 4. Your do not have diabetes, you are prediabetic and we will control it with diet and exercise.  If we did any lab work today, and the results require attention, either me or my nurse will get in touch with you. If everything is normal, you will get a letter in mail and a message via . If you don't hear from [place] in two weeks, please give [place] a call. Otherwise, we look forward to seeing you again at your next visit. If you have any questions or concerns before then, please call the clinic at [phone].  Please bring all your medications to every doctors visit  Sign up for My Chart to have easy access to your labs results, and communication with your Primary care physician. Please ask Front Desk for some assistance.   Please check-out at the front desk before leaving the clinic.    Take Care,   Dr. Andy Gauss   Diabetes Mellitus and Nutrition When you have diabetes (diabetes mellitus), it is very important to have healthy eating habits because your blood sugar (glucose) levels are greatly affected by what you eat and drink. Eating healthy foods in the appropriate amounts, at about the same times every day, can help you:  Control your blood glucose.  Lower your risk of heart disease.  Improve your blood pressure.  Reach or maintain a healthy weight.  Every person with diabetes is different, and each person has different needs for a meal plan. Your health care provider may recommend that you work with a diet and nutrition specialist (dietitian) to make a meal plan that is best for you. Your meal plan may vary depending on factors such as:  The calories you need.  The medicines you take.  Your weight.  Your blood glucose, blood pressure, and  cholesterol levels.  Your activity level.  Other health conditions you have, such as heart or kidney disease.  How do carbohydrates affect me? Carbohydrates affect your blood glucose level more than any other type of food. Eating carbohydrates naturally increases the amount of glucose in your blood. Carbohydrate counting is a method for keeping track of how many carbohydrates you eat. Counting carbohydrates is important to keep your blood glucose at a healthy level, especially if you use insulin or take certain oral diabetes medicines. It is important to know how many carbohydrates you can safely have in each meal. This is different for every person. Your dietitian can help you calculate how many carbohydrates you should have at each meal and for snack. Foods that contain carbohydrates include:  Bread, cereal, rice, pasta, and crackers.  Potatoes and corn.  Peas, beans, and lentils.  Milk and yogurt.  Fruit and juice.  Desserts, such as cakes, cookies, ice cream, and candy.  How does alcohol affect me? Alcohol can cause a sudden decrease in blood glucose (hypoglycemia), especially if you use insulin or take certain oral diabetes medicines. Hypoglycemia can be a life-threatening condition. Symptoms of hypoglycemia (sleepiness, dizziness, and confusion) are similar to symptoms of having too much alcohol. If your health care provider says that alcohol is safe for you, follow these guidelines:  Limit alcohol intake to no more than 1 drink per day for nonpregnant women and  2 drinks per day for men. One drink equals 12 oz of beer, 5 oz of wine, or 1 oz of hard liquor.  Do not drink on an empty stomach.  Keep yourself hydrated with water, diet soda, or unsweetened iced tea.  Keep in mind that regular soda, juice, and other mixers may contain a lot of sugar and must be counted as carbohydrates.  What are tips for following this plan? Reading food labels  Start by checking the serving  size on the label. The amount of calories, carbohydrates, fats, and other nutrients listed on the label are based on one serving of the food. Many foods contain more than one serving per package.  Check the total grams (g) of carbohydrates in one serving. You can calculate the number of servings of carbohydrates in one serving by dividing the total carbohydrates by 15. For example, if a food has 30 g of total carbohydrates, it would be equal to 2 servings of carbohydrates.  Check the number of grams (g) of saturated and trans fats in one serving. Choose foods that have low or no amount of these fats.  Check the number of milligrams (mg) of sodium in one serving. Most people should limit total sodium intake to less than 2,300 mg per day.  Always check the nutrition information of foods labeled as "low-fat" or "nonfat". These foods may be higher in added sugar or refined carbohydrates and should be avoided.  Talk to your dietitian to identify your daily goals for nutrients listed on the label. Shopping  Avoid buying canned, premade, or processed foods. These foods tend to be high in fat, sodium, and added sugar.  Shop around the outside edge of the grocery store. This includes fresh fruits and vegetables, bulk grains, fresh meats, and fresh dairy. Cooking  Use low-heat cooking methods, such as baking, instead of high-heat cooking methods like deep frying.  Cook using healthy oils, such as olive, canola, or sunflower oil.  Avoid cooking with butter, cream, or high-fat meats. Meal planning  Eat meals and snacks regularly, preferably at the same times every day. Avoid going long periods of time without eating.  Eat foods high in fiber, such as fresh fruits, vegetables, beans, and whole grains. Talk to your dietitian about how many servings of carbohydrates you can eat at each meal.  Eat 4-6 ounces of lean protein each day, such as lean meat, chicken, fish, eggs, or tofu. 1 ounce is equal to 1  ounce of meat, chicken, or fish, 1 egg, or 1/4 cup of tofu.  Eat some foods each day that contain healthy fats, such as avocado, nuts, seeds, and fish. Lifestyle   Check your blood glucose regularly.  Exercise at least 30 minutes 5 or more days each week, or as told by your health care provider.  Take medicines as told by your health care provider.  Do not use any products that contain nicotine or tobacco, such as cigarettes and e-cigarettes. If you need help quitting, ask your health care provider.  Work with a Social worker or diabetes educator to identify strategies to manage stress and any emotional and social challenges. What are some questions to ask my health care provider?  Do I need to meet with a diabetes educator?  Do I need to meet with a dietitian?  What number can I call if I have questions?  When are the best times to check my blood glucose? Where to find more information:  American Diabetes Association: diabetes.org/food-and-fitness/food  Academy of Nutrition and Dietetics: PokerClues.dk  [employer] of Diabetes and Digestive and Kidney Diseases (NIH): ContactWire.be Summary  A healthy meal plan will help you control your blood glucose and maintain a healthy lifestyle.  Working with a diet and nutrition specialist (dietitian) can help you make a meal plan that is best for you.  Keep in mind that carbohydrates and alcohol have immediate effects on your blood glucose levels. It is important to count carbohydrates and to use alcohol carefully. This information is not intended to replace advice given to you by your health care provider. Make sure you discuss any questions you have with your health care provider. Document Released: 06/20/2005 Document Revised: 10/28/2016 Document Reviewed: 10/28/2016 Elsevier Interactive Patient Education   [employer].

## 2018-04-17 LAB — VITAMIN D 25 HYDROXY (VIT D DEFICIENCY, FRACTURES): Vit D, 25-Hydroxy: 19.8 ng/mL — ABNORMAL LOW (ref 30.0–100.0)

## 2018-04-20 ENCOUNTER — Other Ambulatory Visit: Payer: Self-pay | Admitting: Family Medicine

## 2018-04-20 ENCOUNTER — Telehealth: Payer: Self-pay

## 2018-04-20 DIAGNOSIS — M810 Age-related osteoporosis without current pathological fracture: Secondary | ICD-10-CM

## 2018-04-20 NOTE — Telephone Encounter (Signed)
Spoke with Clarise Cruz regarding possible diagnosis that are covered for dexa scan for patient's with [employer] medicare.  We can use estrogen deficiency, osteopenia, osteoporosis, vertebral fractures or hyperparathyroidism.  Will forward to MD to advise.  Yosef Krogh,CMA

## 2018-04-20 NOTE — Telephone Encounter (Signed)
Clarise Cruz at the [facility] called to say the diagnosis listed for the DEXA order will not work with insurance and the diagnosis needs to be changed.  Call for questions: [phone] ext [phone]  Danley Danker, RN [facility])

## 2018-04-27 ENCOUNTER — Other Ambulatory Visit: Payer: Self-pay

## 2018-04-27 NOTE — Patient Outreach (Signed)
[facility]) Care Management  04/27/2018  ELMIRA OLKOWSKI Jan 08, 1951 [id number]   Medication Adherence call to Mrs. Aashika Frankowski spoke with patient she ask to call [facility] and re-order her Lisinopril 40 mg for 90 days supply, patient is due on this medication. [facility] will have it ready for patient to pick up Mrs. Fregeau is showing past due under [facility].   [place] Management Direct Dial [phone]  Fax [phone] [email or web address]

## 2018-05-26 ENCOUNTER — Other Ambulatory Visit: Payer: Self-pay | Admitting: Family Medicine

## 2018-06-03 ENCOUNTER — Ambulatory Visit
Admission: RE | Admit: 2018-06-03 | Discharge: 2018-06-03 | Disposition: A | Discharge status: Home / Self Care | Payer: Medicare Other | Source: Ambulatory Visit | Attending: Family Medicine | Admitting: Family Medicine

## 2018-06-03 DIAGNOSIS — Z13820 Encounter for screening for osteoporosis: Secondary | ICD-10-CM | POA: Diagnosis not present

## 2018-06-03 DIAGNOSIS — M810 Age-related osteoporosis without current pathological fracture: Secondary | ICD-10-CM

## 2018-06-03 DIAGNOSIS — Z780 Asymptomatic menopausal state: Secondary | ICD-10-CM | POA: Diagnosis not present

## 2018-07-14 ENCOUNTER — Other Ambulatory Visit: Payer: Self-pay

## 2018-07-14 NOTE — Patient Outreach (Signed)
Sarah [name]) Care Management  07/14/2018  Sarah [name] 06-30-1951 [id number]   Medication Adherence call to Sarah [name] spoke with patient she is no longer taking Metformin 500 mg doctor took her off. Sarah [name] is showing past due under [facility].   [place] Management Direct Dial [phone]  Fax [phone] [name]

## 2018-07-16 ENCOUNTER — Other Ambulatory Visit: Payer: Self-pay

## 2018-07-16 DIAGNOSIS — E559 Vitamin D deficiency, unspecified: Secondary | ICD-10-CM | POA: Insufficient documentation

## 2018-07-16 DIAGNOSIS — B0089 Other herpesviral infection: Secondary | ICD-10-CM | POA: Insufficient documentation

## 2018-07-16 DIAGNOSIS — Z8612 Personal history of poliomyelitis: Secondary | ICD-10-CM

## 2018-07-16 DIAGNOSIS — M2550 Pain in unspecified joint: Secondary | ICD-10-CM | POA: Insufficient documentation

## 2018-07-16 HISTORY — DX: Pain in unspecified joint: M25.50

## 2018-07-16 HISTORY — DX: Personal history of poliomyelitis: Z86.12

## 2018-07-17 ENCOUNTER — Other Ambulatory Visit: Payer: Self-pay | Admitting: Podiatry

## 2018-07-17 ENCOUNTER — Ambulatory Visit (INDEPENDENT_AMBULATORY_CARE_PROVIDER_SITE_OTHER): Payer: Medicare Other

## 2018-07-17 ENCOUNTER — Ambulatory Visit (INDEPENDENT_AMBULATORY_CARE_PROVIDER_SITE_OTHER): Payer: Medicare Other | Admitting: Podiatry

## 2018-07-17 DIAGNOSIS — M778 Other enthesopathies, not elsewhere classified: Secondary | ICD-10-CM

## 2018-07-17 DIAGNOSIS — M7752 Other enthesopathy of left foot: Secondary | ICD-10-CM | POA: Diagnosis not present

## 2018-07-17 DIAGNOSIS — M216X2 Other acquired deformities of left foot: Secondary | ICD-10-CM

## 2018-07-17 DIAGNOSIS — M2041 Other hammer toe(s) (acquired), right foot: Secondary | ICD-10-CM

## 2018-07-17 DIAGNOSIS — M79672 Pain in left foot: Secondary | ICD-10-CM

## 2018-07-17 DIAGNOSIS — M25572 Pain in left ankle and joints of left foot: Secondary | ICD-10-CM | POA: Diagnosis not present

## 2018-07-17 DIAGNOSIS — M21962 Unspecified acquired deformity of left lower leg: Secondary | ICD-10-CM | POA: Diagnosis not present

## 2018-07-17 DIAGNOSIS — M205X1 Other deformities of toe(s) (acquired), right foot: Secondary | ICD-10-CM | POA: Diagnosis not present

## 2018-07-17 DIAGNOSIS — M779 Enthesopathy, unspecified: Secondary | ICD-10-CM

## 2018-07-17 NOTE — Progress Notes (Signed)
Subjective:  Patient ID: Sarah [name], female    DOB: Aug 14, 1951,  MRN: [id number]  Chief Complaint  Patient presents with  . Foot Pain    left foot pain pain on the submet 5th toe lateral side     66 y.o. female presents with the above complaint.  Still having pain in the outside of the left foot.  States her shoes are somewhat comfortable but putting more pressure on her ankle and she is still having pain on the outside.  Review of Systems: Negative except as noted in the HPI. Denies N/V/F/Ch.  Past Medical History:  Diagnosis Date  . Arthritis   . Hyperlipidemia   . Hypertension   . Polio 1964    Current Outpatient Medications:  .  amLODipine (NORVASC) 10 MG tablet, TAKE ONE TABLET BY MOUTH EVERY DAY, Disp: 30 tablet, Rfl: 11 .  aspirin 81 MG EC tablet, Take 81 mg by mouth daily.  , Disp: , Rfl:  .  atorvastatin (LIPITOR) 20 MG tablet, Take 20 mg by mouth daily., Disp: , Rfl:  .  Calcium Carbonate-Vitamin D (CALTRATE 600+D) 600-400 MG-UNIT per tablet, Take 1 tablet by mouth 2 (two) times daily.  , Disp: , Rfl:  .  clindamycin (CLEOCIN) 300 MG capsule, clindamycin HCl 300 mg capsule, Disp: , Rfl:  .  cloNIDine (CATAPRES) 0.1 MG tablet, Take 0.1 mg by mouth 2 (two) times daily., Disp: , Rfl:  .  diclofenac (VOLTAREN) 75 MG EC tablet, diclofenac sodium 75 mg tablet,delayed release  Take 1 tablet twice a day by oral route., Disp: , Rfl:  .  ezetimibe (ZETIA) 10 MG tablet, Zetia 10 mg tablet  Take 1 tablet every day by oral route for 90 days., Disp: , Rfl:  .  ibuprofen (ADVIL,MOTRIN) 800 MG tablet, Take 1 tablet (800 mg total) by mouth every 8 (eight) hours as needed., Disp: 30 tablet, Rfl: 0 .  levothyroxine (SYNTHROID, LEVOTHROID) 75 MCG tablet, levothyroxine 75 mcg tablet, Disp: , Rfl:  .  lisinopril (PRINIVIL,ZESTRIL) 20 MG tablet, Take 20 mg by mouth daily., Disp: , Rfl:  .  lovastatin (MEVACOR) 20 MG tablet, lovastatin 20 mg tablet  Take 1 tablet every day by oral route., Disp:  , Rfl:  .  meloxicam (MOBIC) 15 MG tablet, meloxicam 15 mg tablet, Disp: , Rfl:  .  metFORMIN (GLUCOPHAGE) 500 MG tablet, metformin 500 mg tablet, Disp: , Rfl:  .  methocarbamol (ROBAXIN-750) 750 MG tablet, Robaxin-750  750 mg tablet  Take 1 tablet every 8 hours by oral route as needed., Disp: , Rfl:  .  metoprolol tartrate (LOPRESSOR) 25 MG tablet, Take 1 tablet (25 mg total) by mouth 2 (two) times daily. Need appt to discuss BP., Disp: 60 tablet, Rfl: 1 .  ondansetron (ZOFRAN) 4 MG tablet, ondansetron HCl 4 mg tablet, Disp: , Rfl:  .  oxyCODONE (OXY IR/ROXICODONE) 5 MG immediate release tablet, Take 1 tablet (5 mg total) by mouth every 6 (six) hours as needed for severe pain., Disp: 20 tablet, Rfl: 0 .  oxyCODONE-acetaminophen (PERCOCET) 10-325 MG tablet, oxycodone-acetaminophen 10 mg-325 mg tablet, Disp: , Rfl:  .  spironolactone (ALDACTONE) 25 MG tablet, Take 1 tablet (25 mg total) by mouth daily. Need appt to discuss BP., Disp: 90 tablet, Rfl: 3 .  tiZANidine (ZANAFLEX) 2 MG tablet, tizanidine 2 mg tablet, Disp: , Rfl:  .  traMADol (ULTRAM) 50 MG tablet, Take 1 tablet (50 mg total) by mouth every 12 (twelve) hours as needed  for moderate pain., Disp: 30 tablet, Rfl: 1 .  Zoster Vaccine Adjuvanted (SHINGRIX) injection, Shingrix (PF) 50 mcg/0.5 mL intramuscular suspension, kit, Disp: , Rfl:   Social History   Tobacco Use  Smoking Status Never Smoker  Smokeless Tobacco Never Used    Allergies  Allergen Reactions  . Penicillins Hives and Rash   Objective:  There were no vitals filed for this visit. There is no height or weight on file to calculate BMI. Constitutional Well developed. Well nourished.  Vascular Dorsalis pedis pulses palpable bilaterally. Posterior tibial pulses palpable bilaterally. Capillary refill normal to all digits.  No cyanosis or clubbing noted. Pedal hair growth normal.  Neurologic Normal speech. Oriented to person, place, and time. Epicritic sensation to  light touch grossly present bilaterally.  Dermatologic Nails well groomed and normal in appearance. No open wounds. H PK left fifth MPJ, fifth met base, right fifth toe DIPJ  Orthopedic: Normal joint ROM without pain or crepitus bilaterally. Adductovarus foot defomity L R 5th Toe Adducovarus toe POP L 5th MPJ, 5th met base, R 5th toe   Radiographs: Taken and reviewed.  Evidence of resection of the fifth metatarsal head no acute fractures dislocation Assessment:   1. Capsulitis of metatarsophalangeal (MTP) joint of left foot   2. Acquired cavovarus foot deformity, left   3. Deformity of left foot   4. Pain in joint of left foot   5. Acquired adductovarus rotation of toe, right   6. Hammer toe of right foot    Plan:  Patient was evaluated and treated and all questions answered.  Capsulitis MPJ, cavovarus foot type left -Continued pain despite metatarsal head resection.  She has severe lateral column overload due to her foot type.  Would hold off further surgical resection. -Courtesy debridement of left foot calluses -We will have her follow-up with orthotist for possible adjustment of her shoes  Right fifth toe hammertoe -Discussed possible surgical correction to alleviate the contracture and prevent callus formation.  We will further discuss next visit.  Return in about 6 weeks (around 08/28/2018) for Capsulitis, Left; Hammertoe right 5th toe.

## 2018-07-23 ENCOUNTER — Ambulatory Visit: Payer: Medicare Other | Admitting: Orthotics

## 2018-07-23 DIAGNOSIS — M21962 Unspecified acquired deformity of left lower leg: Secondary | ICD-10-CM

## 2018-07-23 DIAGNOSIS — M7752 Other enthesopathy of left foot: Secondary | ICD-10-CM

## 2018-07-23 NOTE — Progress Notes (Signed)
Patient custom shoe (L) is wearing down along lateral border; gave her a RX to go to [facility] to get 5* lateral wedging in shoe.

## 2018-07-28 ENCOUNTER — Other Ambulatory Visit: Payer: Self-pay

## 2018-07-28 ENCOUNTER — Ambulatory Visit (INDEPENDENT_AMBULATORY_CARE_PROVIDER_SITE_OTHER): Payer: Medicare Other

## 2018-07-28 VITALS — BP 140/80 | HR 62 | Temp 97.6°F | Ht 65.0 in | Wt 210.2 lb

## 2018-07-28 DIAGNOSIS — Z0000 Encounter for general adult medical examination without abnormal findings: Secondary | ICD-10-CM | POA: Diagnosis not present

## 2018-07-28 MED ORDER — TRAMADOL HCL 50 MG PO TABS: 50.0000 mg | ORAL_TABLET | Freq: Two times a day (BID) | ORAL | 1 refills | Status: DC | PRN

## 2018-07-28 NOTE — Progress Notes (Signed)
Subjective:   Sarah [name] is a 67 y.o. female who presents for Medicare Annual (Subsequent) preventive examination. The patient was informed that the wellness visit is to identify future health risk and educate and initiate measures that can reduce risk for increased disease through the lifespan.   Review of Systems:  Physical assessment deferred to PCP.  Cardiac Risk Factors include: advanced age (>20mn, >>70women);hypertension;sedentary lifestyle;obesity (BMI >30kg/m2)    Objective:    Vitals: BP 140/80   Pulse 62   Temp 97.6 F (36.4 C) (Oral)   Ht '5\' 5"'$  (1.651 m)   Wt 210 lb 3.2 oz (95.3 kg)   SpO2 98%   BMI 34.98 kg/m   Body mass index is 34.98 kg/m.  Advanced Directives 07/28/2018 04/16/2018 02/25/2018 02/19/2018 02/16/2018 11/27/2017 07/07/2017  Does Patient Have a Medical Advance Directive? No No No No No No No  Would patient like information on creating a medical advance directive? Yes (MAU/Ambulatory/Procedural Areas - Information given) No - Patient declined No - Patient declined No - Patient declined No - Patient declined No - Patient declined No - Patient declined    Tobacco Social History   Tobacco Use  Smoking Status Never Smoker  Smokeless Tobacco Never Used  Tobacco Comment   No plans to start     Counseling given: Not Answered Comment: No plans to start   Clinical Intake:  Pre-visit preparation completed: Yes  Pain : 0-10 Pain Score: 8  Pain Type: Chronic pain Pain Location: Foot Pain Orientation: Left    Nutritional Status: BMI > 30  Obese Nutritional Risks: None Diabetes: No  How often do you need to have someone help you when you read instructions, pamphlets, or other written materials from your doctor or pharmacy?: 1 - Never What is the last grade level you completed in school?: High School  Interpreter Needed?: No    Past Medical History:  Diagnosis Date  . Arthritis   . Hyperlipidemia   . Hypertension   . Polio 1964    Past Surgical History:  Procedure Laterality Date  . FOOT SURGERY Left 1961   due to polio  . HYSTEROSCOPY W/D&C N/A 02/25/2018   Procedure: DILATATION AND CURETTAGE /HYSTEROSCOPY;  Surgeon: EChancy Milroy MD;  Location[facility]  Service: Gynecology;  Laterality: N/A;  . TONSILLECTOMY AND ADENOIDECTOMY     Family History  Problem Relation Age of Onset  . Hypertension Mother   . Stroke Mother   . Cancer Father 45      Unknown  . Stroke Sister        multiple  . Hypertension Sister   . Kidney disease Sister   . Diabetes Brother   . Hypertension Sister   . Hypertension Brother   . Kidney disease Son   . Diabetes Son   . Hypertension Daughter    Social History   Socioeconomic History  . Marital status: Widowed    Spouse name: Not on file  . Number of children: 6  . Years of education: 158 . Highest education level: 12th grade  Occupational History  . Not on file  Social Needs  . Financial resource strain: Not hard at all  . Food insecurity:    Worry: Never true    Inability: Never true  . Transportation needs:    Medical: No    Non-medical: No  Tobacco Use  . Smoking status: Never Smoker  . Smokeless tobacco: Never Used  .  Tobacco comment: No plans to start  Substance and Sexual Activity  . Alcohol use: No  . Drug use: No  . Sexual activity: Not Currently  Lifestyle  . Physical activity:    Days per week: 0 days    Minutes per session: 0 min  . Stress: Not at all  Relationships  . Social connections:    Talks on phone: More than three times a week    Gets together: Twice a week    Attends religious service: More than 4 times per year    Active member of club or organization: No    Attends meetings of clubs or organizations: Never    Relationship status: Widowed  Other Topics Concern  . Not on file  Social History Narrative   Current Social History 07/28/18   Who lives at home: Adopted son, Tawni Levy (72 yo) and adopted daughter,  Johnnette Gourd (74 yo)07/28/18   Lives in one level home. Home has smoke alarms. No grab bars at toilet but one at tub. No throw rugs at floor.    Always wears seat belt.   Eats meat, vegetables, fruits. Cooks at home.    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 amLODipine (NORVASC) 10 MG tablet TAKE ONE TABLET BY MOUTH EVERY DAY  . aspirin 81 MG EC tablet Take 81 mg by mouth daily.    Marland Kitchen atorvastatin (LIPITOR) 20 MG tablet Take 20 mg by mouth daily.  . Calcium Carbonate-Vitamin D (CALTRATE 600+D) 600-400 MG-UNIT per tablet Take 1 tablet by mouth 2 (two) times daily.    . cloNIDine (CATAPRES) 0.1 MG tablet Take 0.1 mg by mouth 2 (two) times daily.  Marland Kitchen ibuprofen (ADVIL,MOTRIN) 800 MG tablet Take 1 tablet (800 mg total) by mouth every 8 (eight) hours as needed.  Marland Kitchen lisinopril (PRINIVIL,ZESTRIL) 20 MG tablet Take 20 mg by mouth daily.  . metoprolol tartrate (LOPRESSOR) 25 MG tablet Take 1 tablet (25 mg total) by mouth 2 (two) times daily. Need appt to discuss BP.  Marland Kitchen spironolactone (ALDACTONE) 25 MG tablet Take 1 tablet (25 mg total) by mouth daily. Need appt to discuss BP.  . traMADol (ULTRAM) 50 MG tablet Take 1 tablet (50 mg total) by mouth every 12 (twelve) hours as needed for moderate pain.  . metFORMIN (GLUCOPHAGE) 500 MG tablet metformin 500 mg tablet  . oxyCODONE (OXY IR/ROXICODONE) 5 MG immediate release tablet Take 1 tablet (5 mg total) by mouth every 6 (six) hours  as needed for severe  pain. (Patient not taking: Reported on 07/28/2018)  . oxyCODONE-acetaminophen (PERCOCET) 10-325 MG tablet oxycodone-acetaminophen 10 mg-325 mg tablet  . tiZANidine (ZANAFLEX) 2 MG tablet tizanidine 2 mg tablet  . Zoster Vaccine Adjuvanted [facility]) injection Shingrix (PF) 50 mcg/0.5 mL intramuscular suspension, kit  . [DISCONTINUED] clindamycin (CLEOCIN) 300 MG capsule clindamycin HCl 300 mg capsule  . [DISCONTINUED] diclofenac (VOLTAREN) 75 MG EC tablet diclofenac sodium 75 mg tablet,delayed release  Take 1 tablet twice a day by oral route.  . [DISCONTINUED] ezetimibe (ZETIA) 10 MG tablet Zetia 10 mg tablet  Take 1 tablet every day by oral route for 90 days.  . [DISCONTINUED] levothyroxine (SYNTHROID, LEVOTHROID) 75 MCG tablet levothyroxine 75 mcg tablet  . [DISCONTINUED] lovastatin (MEVACOR) 20 MG tablet lovastatin 20 mg tablet  Take 1 tablet every day by oral route.  . [DISCONTINUED] meloxicam (MOBIC) 15 MG tablet meloxicam 15 mg tablet  . [DISCONTINUED] methocarbamol (ROBAXIN-750) 750 MG tablet Robaxin-750  750 mg tablet  Take 1 tablet every 8 hours by oral route as needed.  . [DISCONTINUED] ondansetron (ZOFRAN) 4 MG tablet ondansetron HCl 4 mg tablet   No facility-administered encounter medications on file as of 07/28/2018.     Activities of Daily Living In your present state of health, do you have any difficulty performing the following activities: 07/28/2018 02/25/2018  Hearing? N N  Vision? N N  Difficulty concentrating or making decisions? N N  Walking or climbing stairs? N N  Dressing or bathing? N N  Doing errands, shopping? N -  Preparing Food and eating ? N -  Using the Toilet? N -  In the past six months, have you accidently leaked urine? N -  Do you have problems with loss of bowel control? N -  Managing your Medications? N -  Managing your Finances? N -  Housekeeping or managing your Housekeeping? N -  Some recent data might be hidden     Patient Care Team: Marjie Skiff, MD as PCP - General (Family Medicine) Evelina Bucy, DPM as Consulting Physician (Podiatry)    Assessment:   This is a routine wellness examination for Makenly.  Exercise Activities and Dietary recommendations Current Exercise Habits: The patient does not participate in regular exercise at present, Exercise limited by: orthopedic condition(s)  Goals    . Blood Pressure < 150/90    . Increase physical activity          . Weight (lb) < 189 lb (85.7 kg)     5 % weight loss        Fall Risk Fall Risk  07/28/2018 07/07/2017 04/08/2017 02/27/2016 01/30/2016  Falls in the past year? No No No Yes Yes  Number falls in past yr: - - - 2 or more 2 or more  Injury with Fall? - - - Yes Yes  Comment - - - - Scraped right shin  Risk Factor Category  - - - High Fall Risk High Fall Risk  Comment - - - no falls since last visit -  Risk for fall due to : - Impaired balance/gait History of fall(s);Impaired balance/gait;Impaired mobility - History of fall(s);Impaired balance/gait;Impaired mobility  Follow up - - - - Falls prevention discussed   Is the patient's home free of loose throw rugs in walkways, pet beds, electrical cords, etc?   yes      Grab bars in the bathroom? yes      Handrails on the stairs?   yes      Adequate lighting?  yes  Timed Get Up and Go performed: 10 seconds  Depression Screen PHQ 2/9 Scores 07/28/2018 04/16/2018 02/16/2018 11/27/2017  PHQ - 2 Score 0 0 0 0  Exception Documentation - - - -     Cognitive Function MMSE - Mini Mental State Exam 07/28/2018  Orientation to time 5  Orientation to Place 5  Registration 3  Attention/ Calculation 5  Recall 3  Language- name 2 objects 2  Language- repeat 1  Language- follow 3 step command 3  Language- read & follow direction 1  Write a sentence 1  Copy design 1  Total score 30     6CIT Screen 07/28/2018  What Year? 0 points  What month? 0 points  What time? 0 points   Count back from 20 0 points  Months in reverse 0 points  Repeat phrase 0 points  Total Score 0    Immunization History  Administered Date(s) Administered  . Influenza Whole 07/27/2007  . Pneumococcal Conjugate-13 04/16/2018  . Td 02/04/1990  . Tdap 01/31/2016  . Zoster 01/31/2016  . Zoster Recombinat (Shingrix) 05/21/2017    Qualifies for Shingles Vaccine?Has started Shingrix Flu vaccine declined.  Screening Tests Health Maintenance  Topic Date Due  . INFLUENZA VACCINE  02/04/2019 (Originally 05/07/2018)  . PNA vac Low Risk Adult (2 of 2 - PPSV23) 04/17/2019  . MAMMOGRAM  11/06/2019  . PAP SMEAR  07/07/2020  . COLONOSCOPY  06/08/2023  . TETANUS/TDAP  01/30/2026  . DEXA SCAN  Completed  . Hepatitis C Screening  Completed  . HIV Screening  Completed    Cancer Screenings: Lung: Low Dose CT Chest recommended if Age 72-80 years, 30 pack-year currently smoking OR have quit w/in 15years. Patient does not qualify. Breast:  Up to date on Mammogram? Yes   Up to date of Bone Density/Dexa? Yes Colorectal: yes  Additional Screenings: : Hepatitis C Screening: complete    Plan:  Follow-up appt made with PCP. Flu vaccine declined. Patient asked for refill on Tramadol for foot pain. Sent to PCP under separate encounter.   I have personally reviewed and noted the following in the patient's chart:   . Medical and social history . Use of alcohol, tobacco or illicit drugs  . Current medications and supplements . Functional ability and status . Nutritional status . Physical activity . Advanced directives . List of other physicians . Hospitalizations, surgeries, and ER visits in previous 12 months . Vitals . Screenings to include cognitive, depression, and falls . Referrals and appointments  In addition, I have reviewed and discussed with patient certain preventive protocols, quality metrics, and best practice recommendations. A written personalized care plan for preventive  services as well as general preventive health recommendations were provided to patient.     Esau Grew, RN  07/28/2018

## 2018-07-28 NOTE — Patient Instructions (Addendum)
[name] , Thank you for taking time to come for your Medicare Wellness Visit. I appreciate your ongoing commitment to your health goals. Please review the following plan we discussed and let me know if I can assist you in the future.   Please keep follow up appointment with Dr. Andy Gauss on 08/07/18 at 8:50 am.  These are the goals we discussed: Goals    . Blood Pressure < 150/90    . Increase physical activity          . Weight (lb) < 189 lb (85.7 kg)     5 % weight loss        This is a list of the screening recommended for you and due dates:  Health Maintenance  Topic Date Due  . Flu Shot  02/04/2019*  . Pneumonia vaccines (2 of 2 - PPSV23) 04/17/2019  . Mammogram  11/06/2019  . Pap Smear  07/07/2020  . Colon Cancer Screening  06/08/2023  . Tetanus Vaccine  01/30/2026  . DEXA scan (bone density measurement)  Completed  .  Hepatitis C: One time screening is recommended by Center for Disease Control  (CDC) for  adults born from [birth year] through 1963.   Completed  . HIV Screening  Completed  *Topic was postponed. The date shown is not the original due date.    Fall Prevention in the Home Falls can cause injuries. They can happen to people of all ages. There are many things you can do to make your home safe and to help prevent falls. What can I do on the outside of my home?  Regularly fix the edges of walkways and driveways and fix any cracks.  Remove anything that might make you trip as you walk through a door, such as a raised step or threshold.  Trim any bushes or trees on the path to your home.  Use bright outdoor lighting.  Clear any walking paths of anything that might make someone trip, such as rocks or tools.  Regularly check to see if handrails are loose or broken. Make sure that both sides of any steps have handrails.  Any raised decks and porches should have guardrails on the edges.  Have any leaves, snow, or ice cleared regularly.  Use sand or salt on  walking paths during winter.  Clean up any spills in your garage right away. This includes oil or grease spills. What can I do in the bathroom?  Use night lights.  Install grab bars by the toilet and in the tub and shower. Do not use towel bars as grab bars.  Use non-skid mats or decals in the tub or shower.  If you need to sit down in the shower, use a plastic, non-slip stool.  Keep the floor dry. Clean up any water that spills on the floor as soon as it happens.  Remove soap buildup in the tub or shower regularly.  Attach bath mats securely with double-sided non-slip rug tape.  Do not have throw rugs and other things on the floor that can make you trip. What can I do in the bedroom?  Use night lights.  Make sure that you have a light by your bed that is easy to reach.  Do not use any sheets or blankets that are too big for your bed. They should not hang down onto the floor.  Have a firm chair that has side arms. You can use this for support while you get dressed.  Do not have  throw rugs and other things on the floor that can make you trip. What can I do in the kitchen?  Clean up any spills right away.  Avoid walking on wet floors.  Keep items that you use a lot in easy-to-reach places.  If you need to reach something above you, use a strong step stool that has a grab bar.  Keep electrical cords out of the way.  Do not use floor polish or wax that makes floors slippery. If you must use wax, use non-skid floor wax.  Do not have throw rugs and other things on the floor that can make you trip. What can I do with my stairs?  Do not leave any items on the stairs.  Make sure that there are handrails on both sides of the stairs and use them. Fix handrails that are broken or loose. Make sure that handrails are as long as the stairways.  Check any carpeting to make sure that it is firmly attached to the stairs. Fix any carpet that is loose or worn.  Avoid having throw  rugs at the top or bottom of the stairs. If you do have throw rugs, attach them to the floor with carpet tape.  Make sure that you have a light switch at the top of the stairs and the bottom of the stairs. If you do not have them, ask someone to add them for you. What else can I do to help prevent falls?  Wear shoes that: ? Do not have high heels. ? Have rubber bottoms. ? Are comfortable and fit you well. ? Are closed at the toe. Do not wear sandals.  If you use a stepladder: ? Make sure that it is fully opened. Do not climb a closed stepladder. ? Make sure that both sides of the stepladder are locked into place. ? Ask someone to hold it for you, if possible.  Clearly mark and make sure that you can see: ? Any grab bars or handrails. ? First and last steps. ? Where the edge of each step is.  Use tools that help you move around (mobility aids) if they are needed. These include: ? Canes. ? Walkers. ? Scooters. ? Crutches.  Turn on the lights when you go into a dark area. Replace any light bulbs as soon as they burn out.  Set up your furniture so you have a clear path. Avoid moving your furniture around.  If any of your floors are uneven, fix them.  If there are any pets around you, be aware of where they are.  Review your medicines with your doctor. Some medicines can make you feel dizzy. This can increase your chance of falling. Ask your doctor what other things that you can do to help prevent falls. This information is not intended to replace advice given to you by your health care provider. Make sure you discuss any questions you have with your health care provider. Document Released: 07/20/2009 Document Revised: 02/29/2016 Document Reviewed: 10/28/2014 Elsevier Interactive Patient Education  2018 [place] Prevention in the Home Falls can cause injuries. They can happen to people of all ages. There are many things you can do to make your home safe and to help  prevent falls. What can I do on the outside of my home?  Regularly fix the edges of walkways and driveways and fix any cracks.  Remove anything that might make you trip as you walk through a door, such as a raised  step or threshold.  Trim any bushes or trees on the path to your home.  Use bright outdoor lighting.  Clear any walking paths of anything that might make someone trip, such as rocks or tools.  Regularly check to see if handrails are loose or broken. Make sure that both sides of any steps have handrails.  Any raised decks and porches should have guardrails on the edges.  Have any leaves, snow, or ice cleared regularly.  Use sand or salt on walking paths during winter.  Clean up any spills in your garage right away. This includes oil or grease spills. What can I do in the bathroom?  Use night lights.  Install grab bars by the toilet and in the tub and shower. Do not use towel bars as grab bars.  Use non-skid mats or decals in the tub or shower.  If you need to sit down in the shower, use a plastic, non-slip stool.  Keep the floor dry. Clean up any water that spills on the floor as soon as it happens.  Remove soap buildup in the tub or shower regularly.  Attach bath mats securely with double-sided non-slip rug tape.  Do not have throw rugs and other things on the floor that can make you trip. What can I do in the bedroom?  Use night lights.  Make sure that you have a light by your bed that is easy to reach.  Do not use any sheets or blankets that are too big for your bed. They should not hang down onto the floor.  Have a firm chair that has side arms. You can use this for support while you get dressed.  Do not have throw rugs and other things on the floor that can make you trip. What can I do in the kitchen?  Clean up any spills right away.  Avoid walking on wet floors.  Keep items that you use a lot in easy-to-reach places.  If you need to reach  something above you, use a strong step stool that has a grab bar.  Keep electrical cords out of the way.  Do not use floor polish or wax that makes floors slippery. If you must use wax, use non-skid floor wax.  Do not have throw rugs and other things on the floor that can make you trip. What can I do with my stairs?  Do not leave any items on the stairs.  Make sure that there are handrails on both sides of the stairs and use them. Fix handrails that are broken or loose. Make sure that handrails are as long as the stairways.  Check any carpeting to make sure that it is firmly attached to the stairs. Fix any carpet that is loose or worn.  Avoid having throw rugs at the top or bottom of the stairs. If you do have throw rugs, attach them to the floor with carpet tape.  Make sure that you have a light switch at the top of the stairs and the bottom of the stairs. If you do not have them, ask someone to add them for you. What else can I do to help prevent falls?  Wear shoes that: ? Do not have high heels. ? Have rubber bottoms. ? Are comfortable and fit you well. ? Are closed at the toe. Do not wear sandals.  If you use a stepladder: ? Make sure that it is fully opened. Do not climb a closed stepladder. ? Make sure that both  sides of the stepladder are locked into place. ? Ask someone to hold it for you, if possible.  Clearly mark and make sure that you can see: ? Any grab bars or handrails. ? First and last steps. ? Where the edge of each step is.  Use tools that help you move around (mobility aids) if they are needed. These include: ? Canes. ? Walkers. ? Scooters. ? Crutches.  Turn on the lights when you go into a dark area. Replace any light bulbs as soon as they burn out.  Set up your furniture so you have a clear path. Avoid moving your furniture around.  If any of your floors are uneven, fix them.  If there are any pets around you, be aware of where they are.  Review  your medicines with your doctor. Some medicines can make you feel dizzy. This can increase your chance of falling. Ask your doctor what other things that you can do to help prevent falls. This information is not intended to replace advice given to you by your health care provider. Make sure you discuss any questions you have with your health care provider. Document Released: 07/20/2009 Document Revised: 02/29/2016 Document Reviewed: 10/28/2014 Elsevier Interactive Patient Education  [employer].

## 2018-07-28 NOTE — Progress Notes (Signed)
I have reviewed this visit and agree with the documentation.   

## 2018-08-07 ENCOUNTER — Ambulatory Visit: Payer: Medicare Other | Admitting: Family Medicine

## 2018-08-14 ENCOUNTER — Ambulatory Visit (INDEPENDENT_AMBULATORY_CARE_PROVIDER_SITE_OTHER): Payer: Medicare Other | Admitting: Podiatry

## 2018-08-14 DIAGNOSIS — M7752 Other enthesopathy of left foot: Secondary | ICD-10-CM | POA: Diagnosis not present

## 2018-08-14 DIAGNOSIS — M216X2 Other acquired deformities of left foot: Secondary | ICD-10-CM | POA: Diagnosis not present

## 2018-08-20 ENCOUNTER — Other Ambulatory Visit: Payer: Self-pay

## 2018-08-20 NOTE — Patient Outreach (Signed)
[facility]) Care Management  08/20/2018  Sarah [name] 21-Sep-1951 [id number]   Medication Adherence call to Mrs. Sarah [name] left a message for patient to call back patient is due on Lisinopril 40 mg under [place].   [place] Management Direct Dial [phone]  Fax [phone] [email or web address]

## 2018-08-28 ENCOUNTER — Ambulatory Visit: Payer: Medicare Other | Admitting: Podiatry

## 2018-08-31 NOTE — Progress Notes (Signed)
Subjective:  Patient ID: Sarah [name], female    DOB: Feb 06, 1951,  MRN: [id number]  Chief Complaint  Patient presents with  . Foot Pain    left - 4 week follow up    67 y.o. female presents with the above complaint.  Still having pain not wearing her shoe presently because it needs to be modified.  Review of Systems: Negative except as noted in the HPI. Denies N/V/F/Ch.  Past Medical History:  Diagnosis Date  . Arthritis   . Hyperlipidemia   . Hypertension   . Polio 1964    Current Outpatient Medications:  .  amLODipine (NORVASC) 10 MG tablet, TAKE ONE TABLET BY MOUTH EVERY DAY, Disp: 30 tablet, Rfl: 11 .  aspirin 81 MG EC tablet, Take 81 mg by mouth daily.  , Disp: , Rfl:  .  atorvastatin (LIPITOR) 20 MG tablet, Take 20 mg by mouth daily., Disp: , Rfl:  .  Calcium Carbonate-Vitamin D (CALTRATE 600+D) 600-400 MG-UNIT per tablet, Take 1 tablet by mouth 2 (two) times daily.  , Disp: , Rfl:  .  cloNIDine (CATAPRES) 0.1 MG tablet, Take 0.1 mg by mouth 2 (two) times daily., Disp: , Rfl:  .  ibuprofen (ADVIL,MOTRIN) 800 MG tablet, Take 1 tablet (800 mg total) by mouth every 8 (eight) hours as needed., Disp: 30 tablet, Rfl: 0 .  lisinopril (PRINIVIL,ZESTRIL) 20 MG tablet, Take 20 mg by mouth daily., Disp: , Rfl:  .  metFORMIN (GLUCOPHAGE) 500 MG tablet, metformin 500 mg tablet, Disp: , Rfl:  .  metoprolol tartrate (LOPRESSOR) 25 MG tablet, Take 1 tablet (25 mg total) by mouth 2 (two) times daily. Need appt to discuss BP., Disp: 60 tablet, Rfl: 1 .  oxyCODONE (OXY IR/ROXICODONE) 5 MG immediate release tablet, Take 1 tablet (5 mg total) by mouth every 6 (six) hours as needed for severe pain. (Patient not taking: Reported on 07/28/2018), Disp: 20 tablet, Rfl: 0 .  oxyCODONE-acetaminophen (PERCOCET) 10-325 MG tablet, oxycodone-acetaminophen 10 mg-325 mg tablet, Disp: , Rfl:  .  spironolactone (ALDACTONE) 25 MG tablet, Take 1 tablet (25 mg total) by mouth daily. Need appt to discuss BP., Disp:  90 tablet, Rfl: 3 .  tiZANidine (ZANAFLEX) 2 MG tablet, tizanidine 2 mg tablet, Disp: , Rfl:  .  traMADol (ULTRAM) 50 MG tablet, Take 1 tablet (50 mg total) by mouth every 12 (twelve) hours as needed for moderate pain., Disp: 30 tablet, Rfl: 1 .  Zoster Vaccine Adjuvanted [facility]) injection, Shingrix (PF) 50 mcg/0.5 mL intramuscular suspension, kit, Disp: , Rfl:   Social History   Tobacco Use  Smoking Status Never Smoker  Smokeless Tobacco Never Used  Tobacco Comment   No plans to start    Allergies  Allergen Reactions  . Penicillins Hives and Rash   Objective:  There were no vitals filed for this visit. There is no height or weight on file to calculate BMI. Constitutional Well developed. Well nourished.  Vascular Dorsalis pedis pulses palpable bilaterally. Posterior tibial pulses palpable bilaterally. Capillary refill normal to all digits.  No cyanosis or clubbing noted. Pedal hair growth normal.  Neurologic Normal speech. Oriented to person, place, and time. Epicritic sensation to light touch grossly present bilaterally.  Dermatologic Nails well groomed and normal in appearance. No open wounds. H PK left fifth MPJ, fifth met base, right fifth toe DIPJ  Orthopedic: Normal joint ROM without pain or crepitus bilaterally. Adductovarus foot defomity L R 5th Toe Adducovarus toe POP L 5th MPJ, 5th met  base, R 5th toe   Radiographs: None Assessment:   1. Capsulitis of metatarsophalangeal (MTP) joint of left foot   2. Acquired cavovarus foot deformity, left    Plan:  Patient was evaluated and treated and all questions answered.  Capsulitis MPJ, cavovarus foot type left -Has appt to modify shoes. -Courtesy debridement of left foot calluses  Right fifth toe hammertoe -Wishes to hold off surgical discussion.  Return in about 6 weeks (around 09/25/2018) for Capsulitis F/u.

## 2018-09-24 ENCOUNTER — Ambulatory Visit (INDEPENDENT_AMBULATORY_CARE_PROVIDER_SITE_OTHER): Payer: Medicare Other | Admitting: Podiatry

## 2018-09-24 ENCOUNTER — Encounter: Payer: Self-pay | Admitting: Podiatry

## 2018-09-24 DIAGNOSIS — M21962 Unspecified acquired deformity of left lower leg: Secondary | ICD-10-CM | POA: Diagnosis not present

## 2018-09-24 DIAGNOSIS — M7752 Other enthesopathy of left foot: Secondary | ICD-10-CM | POA: Diagnosis not present

## 2018-09-24 DIAGNOSIS — M216X2 Other acquired deformities of left foot: Secondary | ICD-10-CM

## 2018-09-24 DIAGNOSIS — M2041 Other hammer toe(s) (acquired), right foot: Secondary | ICD-10-CM

## 2018-09-24 DIAGNOSIS — M779 Enthesopathy, unspecified: Secondary | ICD-10-CM | POA: Diagnosis not present

## 2018-09-24 DIAGNOSIS — M778 Other enthesopathies, not elsewhere classified: Secondary | ICD-10-CM

## 2018-09-24 NOTE — Patient Instructions (Signed)
Pre-Operative Instructions  Congratulations, you have decided to take an important step towards improving your quality of life.  You can be assured that the doctors and staff at [facility] will be with you every step of the way.  Here are some important things you should know:  1. Plan to be at the surgery center/hospital at least 1 (one) hour prior to your scheduled time, unless otherwise directed by the surgical center/hospital staff.  You must have a responsible adult accompany you, remain during the surgery and drive you home.  Make sure you have directions to the surgical center/hospital to ensure you arrive on time. 2. If you are having surgery at [facility] or [facility] hospitals, you will need a copy of your medical history and physical form from your family physician within one month prior to the date of surgery. We will give you a form for your primary physician to complete.  3. We make every effort to accommodate the date you request for surgery.  However, there are times where surgery dates or times have to be moved.  We will contact you as soon as possible if a change in schedule is required.   4. No aspirin/ibuprofen for one week before surgery.  If you are on aspirin, any non-steroidal anti-inflammatory medications (Mobic, Aleve, Ibuprofen) should not be taken seven (7) days prior to your surgery.  You make take Tylenol for pain prior to surgery.  5. Medications - If you are taking daily heart and blood pressure medications, seizure, reflux, allergy, asthma, anxiety, pain or diabetes medications, make sure you notify the surgery center/hospital before the day of surgery so they can tell you which medications you should take or avoid the day of surgery. 6. No food or drink after midnight the night before surgery unless directed otherwise by surgical center/hospital staff. 7. No alcoholic beverages 24-hours prior to surgery.  No smoking 24-hours prior or 24-hours after  surgery. 8. Wear loose pants or shorts. They should be loose enough to fit over bandages, boots, and casts. 9. Don't wear slip-on shoes. Sneakers are preferred. 10. Bring your boot with you to the surgery center/hospital.  Also bring crutches or a walker if your physician has prescribed it for you.  If you do not have this equipment, it will be provided for you after surgery. 11. If you have not been contacted by the surgery center/hospital by the day before your surgery, call to confirm the date and time of your surgery. 12. Leave-time from work may vary depending on the type of surgery you have.  Appropriate arrangements should be made prior to surgery with your employer. 13. Prescriptions will be provided immediately following surgery by your doctor.  Fill these as soon as possible after surgery and take the medication as directed. Pain medications will not be refilled on weekends and must be approved by the doctor. 14. Remove nail polish on the operative foot and avoid getting pedicures prior to surgery. 15. Wash the night before surgery.  The night before surgery wash the foot and leg well with water and the antibacterial soap provided. Be sure to pay special attention to beneath the toenails and in between the toes.  Wash for at least three (3) minutes. Rinse thoroughly with water and dry well with a towel.  Perform this wash unless told not to do so by your physician.  Enclosed: 1 Ice pack (please put in freezer the night before surgery)   1 Hibiclens skin cleaner     Pre-op instructions  If you have any questions regarding the instructions, please do not hesitate to call our office.  [place]: [address], [place], [place] [address] -- [phone]  [place]: [address] , [place] [address] -- [phone]  [place]: [address]  [place], [place] [address] -- [phone]  [place]: [address], [address], [place], [place] [address] -- [phone]  Website:  [email or web address] 

## 2018-10-16 ENCOUNTER — Telehealth: Payer: Self-pay | Admitting: *Deleted

## 2018-10-16 ENCOUNTER — Encounter: Payer: Self-pay | Admitting: Podiatry

## 2018-10-16 NOTE — Telephone Encounter (Signed)
"  I need help doing something online for my surgery on January 15.  Please give me a call back."

## 2018-10-21 ENCOUNTER — Other Ambulatory Visit: Payer: Self-pay | Admitting: Podiatry

## 2018-10-21 DIAGNOSIS — M216X2 Other acquired deformities of left foot: Secondary | ICD-10-CM | POA: Diagnosis not present

## 2018-10-21 DIAGNOSIS — M2042 Other hammer toe(s) (acquired), left foot: Secondary | ICD-10-CM | POA: Diagnosis not present

## 2018-10-21 DIAGNOSIS — M779 Enthesopathy, unspecified: Secondary | ICD-10-CM | POA: Diagnosis not present

## 2018-10-21 DIAGNOSIS — M2041 Other hammer toe(s) (acquired), right foot: Secondary | ICD-10-CM | POA: Diagnosis not present

## 2018-10-21 DIAGNOSIS — M21541 Acquired clubfoot, right foot: Secondary | ICD-10-CM | POA: Diagnosis not present

## 2018-10-21 DIAGNOSIS — E7800 Pure hypercholesterolemia, unspecified: Secondary | ICD-10-CM | POA: Diagnosis not present

## 2018-10-21 DIAGNOSIS — M21962 Unspecified acquired deformity of left lower leg: Secondary | ICD-10-CM | POA: Diagnosis not present

## 2018-10-21 DIAGNOSIS — M25775 Osteophyte, left foot: Secondary | ICD-10-CM | POA: Diagnosis not present

## 2018-10-21 DIAGNOSIS — D1632 Benign neoplasm of short bones of left lower limb: Secondary | ICD-10-CM | POA: Diagnosis not present

## 2018-10-21 MED ORDER — ONDANSETRON HCL 4 MG PO TABS: 4.0000 mg | ORAL_TABLET | Freq: Three times a day (TID) | ORAL | 0 refills | Status: DC | PRN

## 2018-10-21 MED ORDER — OXYCODONE-ACETAMINOPHEN 10-325 MG PO TABS: 1.0000 | ORAL_TABLET | ORAL | 0 refills | Status: DC | PRN

## 2018-10-21 MED ORDER — CLINDAMYCIN HCL 150 MG PO CAPS: 150.0000 mg | ORAL_CAPSULE | Freq: Two times a day (BID) | ORAL | 0 refills | Status: DC

## 2018-10-21 NOTE — Telephone Encounter (Signed)
A pre-op nurse from the surgical center called her and asked her the questions.

## 2018-10-23 ENCOUNTER — Ambulatory Visit (INDEPENDENT_AMBULATORY_CARE_PROVIDER_SITE_OTHER): Payer: Medicare Other | Admitting: Podiatry

## 2018-10-23 ENCOUNTER — Ambulatory Visit (INDEPENDENT_AMBULATORY_CARE_PROVIDER_SITE_OTHER): Payer: Medicare Other

## 2018-10-23 DIAGNOSIS — M7752 Other enthesopathy of left foot: Secondary | ICD-10-CM

## 2018-10-23 DIAGNOSIS — M2041 Other hammer toe(s) (acquired), right foot: Secondary | ICD-10-CM

## 2018-10-23 DIAGNOSIS — M21962 Unspecified acquired deformity of left lower leg: Secondary | ICD-10-CM

## 2018-10-25 NOTE — Progress Notes (Signed)
Subjective:  Patient ID: Sarah [name], female    DOB: 1951-06-09,  MRN: [id number]  Chief Complaint  Patient presents with  . Routine Post Op     dos 01.15.2020 Metatarsal Osteotomy 5th Lt, Metatarsal Head Res. 5th Rt, Hammertoe Repair 5th Rt     DOS: 10/21/2018 Procedure: Left fifth metatarsal osteotomy, right fifth hammertoe correction, metatarsal head resection  68 y.o. female returns for post-op check.  Says that the area is painful and throbbing having some nausea but no fever or chills.  Review of Systems: Negative except as noted in the HPI. Denies F/Ch.  Past Medical History:  Diagnosis Date  . Arthritis   . Hyperlipidemia   . Hypertension   . Polio 1964    Current Outpatient Medications:  .  amLODipine (NORVASC) 10 MG tablet, TAKE ONE TABLET BY MOUTH EVERY DAY, Disp: 30 tablet, Rfl: 11 .  aspirin 81 MG EC tablet, Take 81 mg by mouth daily.  , Disp: , Rfl:  .  atorvastatin (LIPITOR) 20 MG tablet, Take 20 mg by mouth daily., Disp: , Rfl:  .  Calcium Carbonate-Vitamin D (CALTRATE 600+D) 600-400 MG-UNIT per tablet, Take 1 tablet by mouth 2 (two) times daily.  , Disp: , Rfl:  .  clindamycin (CLEOCIN) 150 MG capsule, Take 1 capsule (150 mg total) by mouth 2 (two) times daily., Disp: 14 capsule, Rfl: 0 .  cloNIDine (CATAPRES) 0.1 MG tablet, Take 0.1 mg by mouth 2 (two) times daily., Disp: , Rfl:  .  ibuprofen (ADVIL,MOTRIN) 800 MG tablet, Take 1 tablet (800 mg total) by mouth every 8 (eight) hours as needed., Disp: 30 tablet, Rfl: 0 .  lisinopril (PRINIVIL,ZESTRIL) 20 MG tablet, Take 20 mg by mouth daily., Disp: , Rfl:  .  metFORMIN (GLUCOPHAGE) 500 MG tablet, metformin 500 mg tablet, Disp: , Rfl:  .  metoprolol tartrate (LOPRESSOR) 25 MG tablet, Take 1 tablet (25 mg total) by mouth 2 (two) times daily. Need appt to discuss BP., Disp: 60 tablet, Rfl: 1 .  ondansetron (ZOFRAN) 4 MG tablet, Take 1 tablet (4 mg total) by mouth every 8 (eight) hours as needed for nausea or vomiting.,  Disp: 20 tablet, Rfl: 0 .  oxyCODONE (OXY IR/ROXICODONE) 5 MG immediate release tablet, Take 1 tablet (5 mg total) by mouth every 6 (six) hours as needed for severe pain., Disp: 20 tablet, Rfl: 0 .  oxyCODONE-acetaminophen (PERCOCET) 10-325 MG tablet, oxycodone-acetaminophen 10 mg-325 mg tablet, Disp: , Rfl:  .  oxyCODONE-acetaminophen (PERCOCET) 10-325 MG tablet, Take 1 tablet by mouth every 4 (four) hours as needed for pain., Disp: 20 tablet, Rfl: 0 .  spironolactone (ALDACTONE) 25 MG tablet, Take 1 tablet (25 mg total) by mouth daily. Need appt to discuss BP., Disp: 90 tablet, Rfl: 3 .  tiZANidine (ZANAFLEX) 2 MG tablet, tizanidine 2 mg tablet, Disp: , Rfl:  .  traMADol (ULTRAM) 50 MG tablet, Take 1 tablet (50 mg total) by mouth every 12 (twelve) hours as needed for moderate pain., Disp: 30 tablet, Rfl: 1 .  Zoster Vaccine Adjuvanted [facility]) injection, Shingrix (PF) 50 mcg/0.5 mL intramuscular suspension, kit, Disp: , Rfl:   Social History   Tobacco Use  Smoking Status Never Smoker  Smokeless Tobacco Never Used  Tobacco Comment   No plans to start    Allergies  Allergen Reactions  . Penicillins Hives and Rash   Objective:  There were no vitals filed for this visit. There is no height or weight on file to calculate  BMI. Constitutional Well developed. Well nourished.  Vascular Foot warm and well perfused. Capillary refill normal to all digits.   Neurologic Normal speech. Oriented to person, place, and time. Epicritic sensation to light touch grossly present bilaterally.  Dermatologic Skin healing well without signs of infection. Skin edges well coapted without signs of infection.  Orthopedic: Tenderness to palpation noted about the surgical site.   Radiographs: Taken and reviewed c/w post-op Assessment:   1. Capsulitis of metatarsophalangeal (MTP) joint of left foot   2. Hammer toe of right foot    Plan:  Patient was evaluated and treated and all questions  answered.  S/p foot surgery bilaterally -Progressing as expected post-operatively. -XR: as above -WB Status: WBAT in surgical shoes -Sutures: intact. -Medications: None refilled -Foot redressed.  Return for Fairley Copher patient, keep current post op appt.

## 2018-10-25 NOTE — Progress Notes (Signed)
Subjective:  Patient ID: Sarah [name], female    DOB: 11/22/1950,  MRN: [id number]  Chief Complaint  Patient presents with  . Foot Pain    2 week check capsulitis of left foot; pt stated, "pain is still the same; need refill of tramadol"    68 y.o. female presents with the above complaint.  States that the pain is about the same requesting refill of tramadol  Review of Systems: Negative except as noted in the HPI. Denies N/V/F/Ch.  Past Medical History:  Diagnosis Date  . Arthritis   . Hyperlipidemia   . Hypertension   . Polio 1964    Current Outpatient Medications:  .  amLODipine (NORVASC) 10 MG tablet, TAKE ONE TABLET BY MOUTH EVERY DAY, Disp: 30 tablet, Rfl: 11 .  aspirin 81 MG EC tablet, Take 81 mg by mouth daily.  , Disp: , Rfl:  .  atorvastatin (LIPITOR) 20 MG tablet, Take 20 mg by mouth daily., Disp: , Rfl:  .  Calcium Carbonate-Vitamin D (CALTRATE 600+D) 600-400 MG-UNIT per tablet, Take 1 tablet by mouth 2 (two) times daily.  , Disp: , Rfl:  .  cloNIDine (CATAPRES) 0.1 MG tablet, Take 0.1 mg by mouth 2 (two) times daily., Disp: , Rfl:  .  ibuprofen (ADVIL,MOTRIN) 800 MG tablet, Take 1 tablet (800 mg total) by mouth every 8 (eight) hours as needed., Disp: 30 tablet, Rfl: 0 .  lisinopril (PRINIVIL,ZESTRIL) 20 MG tablet, Take 20 mg by mouth daily., Disp: , Rfl:  .  metFORMIN (GLUCOPHAGE) 500 MG tablet, metformin 500 mg tablet, Disp: , Rfl:  .  metoprolol tartrate (LOPRESSOR) 25 MG tablet, Take 1 tablet (25 mg total) by mouth 2 (two) times daily. Need appt to discuss BP., Disp: 60 tablet, Rfl: 1 .  oxyCODONE (OXY IR/ROXICODONE) 5 MG immediate release tablet, Take 1 tablet (5 mg total) by mouth every 6 (six) hours as needed for severe pain., Disp: 20 tablet, Rfl: 0 .  oxyCODONE-acetaminophen (PERCOCET) 10-325 MG tablet, oxycodone-acetaminophen 10 mg-325 mg tablet, Disp: , Rfl:  .  spironolactone (ALDACTONE) 25 MG tablet, Take 1 tablet (25 mg total) by mouth daily. Need appt to  discuss BP., Disp: 90 tablet, Rfl: 3 .  tiZANidine (ZANAFLEX) 2 MG tablet, tizanidine 2 mg tablet, Disp: , Rfl:  .  traMADol (ULTRAM) 50 MG tablet, Take 1 tablet (50 mg total) by mouth every 12 (twelve) hours as needed for moderate pain., Disp: 30 tablet, Rfl: 1 .  Zoster Vaccine Adjuvanted (SHINGRIX) injection, Shingrix (PF) 50 mcg/0.5 mL intramuscular suspension, kit, Disp: , Rfl:  .  clindamycin (CLEOCIN) 150 MG capsule, Take 1 capsule (150 mg total) by mouth 2 (two) times daily., Disp: 14 capsule, Rfl: 0 .  ondansetron (ZOFRAN) 4 MG tablet, Take 1 tablet (4 mg total) by mouth every 8 (eight) hours as needed for nausea or vomiting., Disp: 20 tablet, Rfl: 0 .  oxyCODONE-acetaminophen (PERCOCET) 10-325 MG tablet, Take 1 tablet by mouth every 4 (four) hours as needed for pain., Disp: 20 tablet, Rfl: 0  Social History   Tobacco Use  Smoking Status Never Smoker  Smokeless Tobacco Never Used  Tobacco Comment   No plans to start    Allergies  Allergen Reactions  . Penicillins Hives and Rash   Objective:  There were no vitals filed for this visit. There is no height or weight on file to calculate BMI. Constitutional Well developed. Well nourished.  Vascular Dorsalis pedis pulses palpable bilaterally. Posterior tibial pulses palpable bilaterally.  Capillary refill normal to all digits.  No cyanosis or clubbing noted. Pedal hair growth normal.  Neurologic Normal speech. Oriented to person, place, and time. Epicritic sensation to light touch grossly present bilaterally.  Dermatologic Nails well groomed and normal in appearance. No open wounds. H PK left fifth MPJ, fifth met base, right fifth toe DIPJ  Orthopedic: Normal joint ROM without pain or crepitus bilaterally. Adductovarus foot defomity L R 5th Toe Adducovarus toe POP L 5th MPJ, 5th met base, R 5th toe   Radiographs: None Assessment:   1. Capsulitis of metatarsophalangeal (MTP) joint of left foot   2. Acquired cavovarus  foot deformity, left   3. Deformity of left foot   4. Capsulitis of foot, right   5. Hammertoe of right foot    Plan:  Patient was evaluated and treated and all questions answered.  Capsulitis MPJ, cavovarus foot type left, Right fifth toe hammertoe, capsulitis -At this point having pain to the point where she is ready for surgery. -Discussed that for her left side we can proceed with further fifth metatarsal head resection but we cannot resect any more of the base of the metatarsal due to the important tendon attachment at the base.  On her right side we can correct the hammertoe and resect the metatarsal head to prevent the bony prominence -Patient has failed all conservative therapy and wishes to proceed with surgical intervention. All risks, benefits, and alternatives discussed with patient. No guarantees given. Consent reviewed and signed by patient. -Planned procedures: Right fifth hammertoe correction, right fifth metatarsal head excision, left fifth metatarsal resection (distal)  No follow-ups on file.

## 2018-10-28 ENCOUNTER — Telehealth: Payer: Self-pay | Admitting: Podiatry

## 2018-10-28 MED ORDER — OXYCODONE-ACETAMINOPHEN 10-325 MG PO TABS: 1.0000 | ORAL_TABLET | ORAL | 0 refills | Status: DC | PRN

## 2018-10-28 NOTE — Addendum Note (Signed)
Addended by: Harriett Sine D on: 10/28/2018 12:01 PM   Modules accepted: Orders

## 2018-10-28 NOTE — Telephone Encounter (Signed)
Pt called requesting refill on her pain medication, Oxycodone. Pt is scheduled for appt on 10/30/18 @ 9:30AM

## 2018-10-28 NOTE — Telephone Encounter (Signed)
I informed pt, she could pick up the percocet in the [place] office.

## 2018-10-30 ENCOUNTER — Ambulatory Visit (INDEPENDENT_AMBULATORY_CARE_PROVIDER_SITE_OTHER): Payer: Medicare Other | Admitting: Podiatry

## 2018-10-30 ENCOUNTER — Encounter: Payer: Self-pay | Admitting: Podiatry

## 2018-10-30 VITALS — BP 162/87 | HR 69 | Temp 97.6°F | Resp 16

## 2018-10-30 DIAGNOSIS — M2041 Other hammer toe(s) (acquired), right foot: Secondary | ICD-10-CM

## 2018-10-30 DIAGNOSIS — Z09 Encounter for follow-up examination after completed treatment for conditions other than malignant neoplasm: Secondary | ICD-10-CM

## 2018-10-30 DIAGNOSIS — M7752 Other enthesopathy of left foot: Secondary | ICD-10-CM

## 2018-10-30 MED ORDER — CLINDAMYCIN HCL 300 MG PO CAPS: 300.0000 mg | ORAL_CAPSULE | Freq: Two times a day (BID) | ORAL | 0 refills | Status: DC

## 2018-10-31 NOTE — Progress Notes (Signed)
Subjective:  Patient ID: Sarah [name], female    DOB: August 03, 1951,  MRN: [id number]  Chief Complaint  Patient presents with  . Routine Post Op     dos 01.15.2020 Metatarsal Osteotomy 5th Lt, Metatarsal Head Res. 5th Rt, Hammertoe Repair 5th Rt Pt. states," L foot doesn't bother me that much it's mostly the Rt foot that's painful; 8/10 sharp intermittent pain." Tx: surgical shoe and percocet -pt denies N/V/F/Ch   DOS: 10/21/2018 Procedure: Left fifth metatarsal osteotomy, right fifth hammertoe correction, metatarsal head resection  68 y.o. female returns for post-op check.   Review of Systems: Negative except as noted in the HPI. Denies F/Ch.  Past Medical History:  Diagnosis Date  . Arthritis   . Hyperlipidemia   . Hypertension   . Polio 1964    Current Outpatient Medications:  .  amLODipine (NORVASC) 10 MG tablet, TAKE ONE TABLET BY MOUTH EVERY DAY, Disp: 30 tablet, Rfl: 11 .  aspirin 81 MG EC tablet, Take 81 mg by mouth daily.  , Disp: , Rfl:  .  atorvastatin (LIPITOR) 20 MG tablet, Take 20 mg by mouth daily., Disp: , Rfl:  .  Calcium Carbonate-Vitamin D (CALTRATE 600+D) 600-400 MG-UNIT per tablet, Take 1 tablet by mouth 2 (two) times daily.  , Disp: , Rfl:  .  cloNIDine (CATAPRES) 0.1 MG tablet, Take 0.1 mg by mouth 2 (two) times daily., Disp: , Rfl:  .  ibuprofen (ADVIL,MOTRIN) 800 MG tablet, Take 1 tablet (800 mg total) by mouth every 8 (eight) hours as needed., Disp: 30 tablet, Rfl: 0 .  lisinopril (PRINIVIL,ZESTRIL) 20 MG tablet, Take 20 mg by mouth daily., Disp: , Rfl:  .  metFORMIN (GLUCOPHAGE) 500 MG tablet, metformin 500 mg tablet, Disp: , Rfl:  .  metoprolol tartrate (LOPRESSOR) 25 MG tablet, Take 1 tablet (25 mg total) by mouth 2 (two) times daily. Need appt to discuss BP., Disp: 60 tablet, Rfl: 1 .  ondansetron (ZOFRAN) 4 MG tablet, Take 1 tablet (4 mg total) by mouth every 8 (eight) hours as needed for nausea or vomiting., Disp: 20 tablet, Rfl: 0 .  oxyCODONE (OXY  IR/ROXICODONE) 5 MG immediate release tablet, Take 1 tablet (5 mg total) by mouth every 6 (six) hours as needed for severe pain., Disp: 20 tablet, Rfl: 0 .  oxyCODONE-acetaminophen (PERCOCET) 10-325 MG tablet, oxycodone-acetaminophen 10 mg-325 mg tablet, Disp: , Rfl:  .  oxyCODONE-acetaminophen (PERCOCET) 10-325 MG tablet, Take 1 tablet by mouth every 4 (four) hours as needed for pain., Disp: 20 tablet, Rfl: 0 .  oxyCODONE-acetaminophen (PERCOCET) 10-325 MG tablet, Take 1 tablet by mouth every 4 (four) hours as needed for pain., Disp: 20 tablet, Rfl: 0 .  spironolactone (ALDACTONE) 25 MG tablet, Take 1 tablet (25 mg total) by mouth daily. Need appt to discuss BP., Disp: 90 tablet, Rfl: 3 .  tiZANidine (ZANAFLEX) 2 MG tablet, tizanidine 2 mg tablet, Disp: , Rfl:  .  traMADol (ULTRAM) 50 MG tablet, Take 1 tablet (50 mg total) by mouth every 12 (twelve) hours as needed for moderate pain., Disp: 30 tablet, Rfl: 1 .  Zoster Vaccine Adjuvanted (SHINGRIX) injection, Shingrix (PF) 50 mcg/0.5 mL intramuscular suspension, kit, Disp: , Rfl:  .  clindamycin (CLEOCIN) 300 MG capsule, Take 1 capsule (300 mg total) by mouth 2 (two) times daily., Disp: 14 capsule, Rfl: 0  Social History   Tobacco Use  Smoking Status Never Smoker  Smokeless Tobacco Never Used  Tobacco Comment   No plans to start  Allergies  Allergen Reactions  . Penicillins Hives and Rash   Objective:   Vitals:   10/30/18 0947  BP: (!) 162/87  Pulse: 69  Resp: 16  Temp: 97.6 F (36.4 C)   There is no height or weight on file to calculate BMI. Constitutional Well developed. Well nourished.  Vascular Foot warm and well perfused. Capillary refill normal to all digits.   Neurologic Normal speech. Oriented to person, place, and time. Epicritic sensation to light touch grossly present bilaterally.  Dermatologic  left foot skin healing well without foot infection skin edges well coapted.  Right foot edema slight warmth skin edges  coapted.  Orthopedic: Tenderness to palpation noted about the surgical site.    Radiographs: None today. Assessment:   1. Capsulitis of metatarsophalangeal (MTP) joint of left foot   2. Hammer toe of right foot   3. Surgery follow-up    Plan:  Patient was evaluated and treated and all questions answered.  S/p foot surgery bilaterally -Progressing as expected post-operatively. -XR: as above -WB Status: WBAT in surgical shoes -Sutures: intact. -Medications: Rx clindamycin for slight right foot warmth and swelling -Foot redressed.  Return in about 1 week (around 11/06/2018) for Jimmylee Ratterree patient.

## 2018-11-06 ENCOUNTER — Ambulatory Visit (INDEPENDENT_AMBULATORY_CARE_PROVIDER_SITE_OTHER): Payer: Self-pay | Admitting: Podiatry

## 2018-11-06 ENCOUNTER — Other Ambulatory Visit: Payer: Medicare Other

## 2018-11-06 ENCOUNTER — Telehealth: Payer: Self-pay | Admitting: Podiatry

## 2018-11-06 DIAGNOSIS — M2041 Other hammer toe(s) (acquired), right foot: Secondary | ICD-10-CM

## 2018-11-06 DIAGNOSIS — Z09 Encounter for follow-up examination after completed treatment for conditions other than malignant neoplasm: Secondary | ICD-10-CM

## 2018-11-06 DIAGNOSIS — M7752 Other enthesopathy of left foot: Secondary | ICD-10-CM

## 2018-11-06 MED ORDER — OXYCODONE-ACETAMINOPHEN 10-325 MG PO TABS: 1.0000 | ORAL_TABLET | ORAL | 0 refills | Status: DC | PRN

## 2018-11-06 NOTE — Telephone Encounter (Signed)
Pharmacy called stating that they have received prescription for Oxycodone for pt and they are calling to verify the diagnosis for pt to continue prescription.  Please give pharmacy a call.

## 2018-11-06 NOTE — Telephone Encounter (Signed)
Left message on pharmacy phone due to they're being closed for lunch, diagnosis of 11/06/2018.

## 2018-11-07 NOTE — Progress Notes (Signed)
Subjective:  Patient ID: Sarah [name], female    DOB: 06/04/1951,  MRN: [id number]  Chief Complaint  Patient presents with  . Routine Post Op    - Metatarsal Head Res. 5th Rt, Hammertoe Repair 5th Rt " my feet are feeling much better this week"    DOS: 10/21/2018 Procedure: Left fifth metatarsal osteotomy, right fifth hammertoe correction, metatarsal head resection  68 y.o. female returns for post-op check. Pain much improved this week.  Review of Systems: Negative except as noted in the HPI. Denies F/Ch.  Past Medical History:  Diagnosis Date  . Arthritis   . Hyperlipidemia   . Hypertension   . Polio 1964    Current Outpatient Medications:  .  amLODipine (NORVASC) 10 MG tablet, TAKE ONE TABLET BY MOUTH EVERY DAY, Disp: 30 tablet, Rfl: 11 .  aspirin 81 MG EC tablet, Take 81 mg by mouth daily.  , Disp: , Rfl:  .  atorvastatin (LIPITOR) 20 MG tablet, Take 20 mg by mouth daily., Disp: , Rfl:  .  Calcium Carbonate-Vitamin D (CALTRATE 600+D) 600-400 MG-UNIT per tablet, Take 1 tablet by mouth 2 (two) times daily.  , Disp: , Rfl:  .  clindamycin (CLEOCIN) 300 MG capsule, Take 1 capsule (300 mg total) by mouth 2 (two) times daily., Disp: 14 capsule, Rfl: 0 .  cloNIDine (CATAPRES) 0.1 MG tablet, Take 0.1 mg by mouth 2 (two) times daily., Disp: , Rfl:  .  ibuprofen (ADVIL,MOTRIN) 800 MG tablet, Take 1 tablet (800 mg total) by mouth every 8 (eight) hours as needed., Disp: 30 tablet, Rfl: 0 .  lisinopril (PRINIVIL,ZESTRIL) 20 MG tablet, Take 20 mg by mouth daily., Disp: , Rfl:  .  metFORMIN (GLUCOPHAGE) 500 MG tablet, metformin 500 mg tablet, Disp: , Rfl:  .  metoprolol tartrate (LOPRESSOR) 25 MG tablet, Take 1 tablet (25 mg total) by mouth 2 (two) times daily. Need appt to discuss BP., Disp: 60 tablet, Rfl: 1 .  ondansetron (ZOFRAN) 4 MG tablet, Take 1 tablet (4 mg total) by mouth every 8 (eight) hours as needed for nausea or vomiting., Disp: 20 tablet, Rfl: 0 .  oxyCODONE (OXY IR/ROXICODONE)  5 MG immediate release tablet, Take 1 tablet (5 mg total) by mouth every 6 (six) hours as needed for severe pain., Disp: 20 tablet, Rfl: 0 .  oxyCODONE-acetaminophen (PERCOCET) 10-325 MG tablet, oxycodone-acetaminophen 10 mg-325 mg tablet, Disp: , Rfl:  .  oxyCODONE-acetaminophen (PERCOCET) 10-325 MG tablet, Take 1 tablet by mouth every 4 (four) hours as needed for pain., Disp: 20 tablet, Rfl: 0 .  oxyCODONE-acetaminophen (PERCOCET) 10-325 MG tablet, Take 1 tablet by mouth every 4 (four) hours as needed for pain., Disp: 20 tablet, Rfl: 0 .  spironolactone (ALDACTONE) 25 MG tablet, Take 1 tablet (25 mg total) by mouth daily. Need appt to discuss BP., Disp: 90 tablet, Rfl: 3 .  tiZANidine (ZANAFLEX) 2 MG tablet, tizanidine 2 mg tablet, Disp: , Rfl:  .  traMADol (ULTRAM) 50 MG tablet, Take 1 tablet (50 mg total) by mouth every 12 (twelve) hours as needed for moderate pain., Disp: 30 tablet, Rfl: 1 .  Zoster Vaccine Adjuvanted [facility]) injection, Shingrix (PF) 50 mcg/0.5 mL intramuscular suspension, kit, Disp: , Rfl:   Social History   Tobacco Use  Smoking Status Never Smoker  Smokeless Tobacco Never Used  Tobacco Comment   No plans to start    Allergies  Allergen Reactions  . Penicillins Hives and Rash   Objective:   There were  no vitals filed for this visit. There is no height or weight on file to calculate BMI. Constitutional Well developed. Well nourished.  Vascular Foot warm and well perfused. Capillary refill normal to all digits.   Neurologic Normal speech. Oriented to person, place, and time. Epicritic sensation to light touch grossly present bilaterally.  Dermatologic Left foot skin healing well skin edges well coapted  Orthopedic: Tenderness to palpation noted about the surgical site.    Radiographs: None today. Assessment:   1. Capsulitis of metatarsophalangeal (MTP) joint of left foot   2. Hammer toe of right foot   3. Surgery follow-up    Plan:  Patient was  evaluated and treated and all questions answered.  S/p foot surgery bilaterally -Progressing as expected post-operatively. -XR: as above -WB Status: WBAT in surgical shoes -Sutures: intact. -Medications: Refill percocet -Foot redressed.  Return in about 1 week (around 11/13/2018) for suture removal .

## 2018-11-13 ENCOUNTER — Encounter: Payer: Medicare Other | Admitting: Podiatry

## 2018-11-14 ENCOUNTER — Ambulatory Visit (INDEPENDENT_AMBULATORY_CARE_PROVIDER_SITE_OTHER): Payer: Medicare Other | Admitting: Podiatry

## 2018-11-14 ENCOUNTER — Encounter: Payer: Self-pay | Admitting: Podiatry

## 2018-11-14 DIAGNOSIS — M7752 Other enthesopathy of left foot: Secondary | ICD-10-CM

## 2018-11-14 MED ORDER — IBUPROFEN 800 MG PO TABS: 800.0000 mg | ORAL_TABLET | Freq: Three times a day (TID) | ORAL | 0 refills | Status: DC | PRN

## 2018-11-19 ENCOUNTER — Other Ambulatory Visit: Payer: Self-pay | Admitting: Family Medicine

## 2018-11-19 DIAGNOSIS — Z1231 Encounter for screening mammogram for malignant neoplasm of breast: Secondary | ICD-10-CM

## 2018-11-26 ENCOUNTER — Ambulatory Visit (INDEPENDENT_AMBULATORY_CARE_PROVIDER_SITE_OTHER): Payer: Medicare Other | Admitting: Podiatry

## 2018-11-26 ENCOUNTER — Encounter: Payer: Self-pay | Admitting: Podiatry

## 2018-11-26 DIAGNOSIS — Z9889 Other specified postprocedural states: Secondary | ICD-10-CM

## 2018-12-16 ENCOUNTER — Encounter: Payer: Self-pay | Admitting: Family Medicine

## 2018-12-16 ENCOUNTER — Ambulatory Visit (INDEPENDENT_AMBULATORY_CARE_PROVIDER_SITE_OTHER): Payer: Medicare Other | Admitting: Family Medicine

## 2018-12-16 ENCOUNTER — Other Ambulatory Visit: Payer: Self-pay

## 2018-12-16 VITALS — BP 124/70 | HR 74 | Temp 98.4°F | Wt 214.5 lb

## 2018-12-16 DIAGNOSIS — I10 Essential (primary) hypertension: Secondary | ICD-10-CM | POA: Diagnosis not present

## 2018-12-16 DIAGNOSIS — E782 Mixed hyperlipidemia: Secondary | ICD-10-CM

## 2018-12-16 DIAGNOSIS — R7303 Prediabetes: Secondary | ICD-10-CM

## 2018-12-16 LAB — POCT GLYCOSYLATED HEMOGLOBIN (HGB A1C): Hemoglobin A1C: 6.3 % — AB (ref 4.0–5.6)

## 2018-12-16 MED ORDER — METOPROLOL TARTRATE 25 MG PO TABS: 25.0000 mg | ORAL_TABLET | Freq: Two times a day (BID) | ORAL | 2 refills | Status: DC

## 2018-12-16 NOTE — Assessment & Plan Note (Signed)
Patient is currently on atorvastatin 20 mg daily.  Last lipid panel was almost a year ago.  Cholesterol and LDL were close to normal.  We will repeat lipid panel today.  Will adjust medication as needed.  Patient is prediabetic and has hypertension.  Could increase atorvastatin to 40 mg if needed.

## 2018-12-16 NOTE — Progress Notes (Signed)
   Subjective:    Patient ID: Sarah [name], female    DOB: [birth year], 68 y.o.   MRN: [id number]   CC: Follow-up for prediabetes.  HPI: Patient is a 68 year old female with a past medical history significant for hypertension, prediabetes, history of polio, hyperlipidemia, presents today to follow-up on chronic issues.  Patient recently (October) had her Medicare annual visit.  Patient recently had Left fifth metatarsal osteotomy, right fifth hammertoe correction, metatarsal head resection.  Patient reports that she is doing well.  She is has a follow-up scheduled with podiatry at the end of the month.  Patient has no acute complaints today.  She reports that she has been unable to exercise due to her foot and has gained some weight since last office visit.  She otherwise denies any chest pain, abdominal pain, shortness of breath, dizziness, fever or chills.   Smoking status reviewed   ROS: all other systems were reviewed and are negative other than in the HPI   Past Medical History:  Diagnosis Date  . Arthritis   . Hyperlipidemia   . Hypertension   . Polio 1964    Past Surgical History:  Procedure Laterality Date  . FOOT SURGERY Left 1961   due to polio  . HYSTEROSCOPY W/D&C N/A 02/25/2018   Procedure: DILATATION AND CURETTAGE /HYSTEROSCOPY;  Surgeon: Chancy Milroy, MD;  Location: [place];  Service: Gynecology;  Laterality: N/A;  . TONSILLECTOMY AND ADENOIDECTOMY      Past medical history, surgical, family, and social history reviewed and updated in the EMR as appropriate.  Objective:  BP 124/70   Pulse 74   Temp 98.4 F (36.9 C) (Oral)   Wt 214 lb 8 oz (97.3 kg)   SpO2 97%   BMI 35.69 kg/m   Vitals and nursing note reviewed  General: NAD, pleasant, able to participate in exam Cardiac: RRR, normal heart sounds, no murmurs. 2+ radial and PT pulses bilaterally Respiratory: CTAB, normal effort, No wheezes, rales or rhonchi Abdomen: soft, nontender,  nondistended, no hepatic or splenomegaly, +BS Extremities: no edema or cyanosis. WWP. Skin: warm and dry, no rashes noted Neuro: alert and oriented x4, no focal deficits Psych: Normal affect and mood   Assessment & Plan:   Prediabetes A1c today is 6.3 up from 6.1.  Patient was not started on metformin as I was trying to give her a chance to work on therapeutic lifestyle changes.  Given increased weight and worsening A1c also her metformin 500 mg for the first week if tolerated will increase to 500 twice daily.  Discussed increasing activity once food is healed and modifying diet.  HYPERTENSION, BENIGN SYSTEMIC BP today is 124/70.  Patient is at goal.  Will continue current regimen. --We will order BMP results on the results.  HLD (hyperlipidemia) Patient is currently on atorvastatin 20 mg daily.  Last lipid panel was almost a year ago.  Cholesterol and LDL were close to normal.  We will repeat lipid panel today.  Will adjust medication as needed.  Patient is prediabetic and has hypertension.  Could increase atorvastatin to 40 mg if needed.    Marjie Skiff, MD [place] PGY-3

## 2018-12-16 NOTE — Assessment & Plan Note (Addendum)
BP today is 124/70.  Patient is at goal.  Will continue current regimen. --We will order BMP results on the results.

## 2018-12-16 NOTE — Assessment & Plan Note (Signed)
A1c today is 6.3 up from 6.1.  Patient was not started on metformin as I was trying to give her a chance to work on therapeutic lifestyle changes.  Given increased weight and worsening A1c also her metformin 500 mg for the first week if tolerated will increase to 500 twice daily.  Discussed increasing activity once food is healed and modifying diet.

## 2018-12-17 LAB — LIPID PANEL
Chol/HDL Ratio: 4.1 ratio (ref 0.0–4.4)
Cholesterol, Total: 153 mg/dL (ref 100–199)
HDL: 37 mg/dL — ABNORMAL LOW (ref >39)
LDL Calculated: 77 mg/dL (ref 0–99)
Triglycerides: 194 mg/dL — ABNORMAL HIGH (ref 0–149)
VLDL Cholesterol Cal: 39 mg/dL (ref 5–40)

## 2018-12-17 LAB — COMPREHENSIVE METABOLIC PANEL
ALT: 21 IU/L (ref 0–32)
AST: 24 IU/L (ref 0–40)
Albumin/Globulin Ratio: 1.3 (ref 1.2–2.2)
Albumin: 3.9 g/dL (ref 3.8–4.8)
Alkaline Phosphatase: 96 IU/L (ref 39–117)
BUN/Creatinine Ratio: 20 (ref 12–28)
BUN: 15 mg/dL (ref 8–27)
Bilirubin Total: 0.4 mg/dL (ref 0.0–1.2)
CO2: 24 mmol/L (ref 20–29)
Calcium: 9.1 mg/dL (ref 8.7–10.3)
Chloride: 105 mmol/L (ref 96–106)
Creatinine, Ser: 0.75 mg/dL (ref 0.57–1.00)
GFR calc Af Amer: 97 mL/min/{1.73_m2} (ref >59)
GFR calc non Af Amer: 84 mL/min/{1.73_m2} (ref >59)
Globulin, Total: 2.9 g/dL (ref 1.5–4.5)
Glucose: 145 mg/dL — ABNORMAL HIGH (ref 65–99)
Potassium: 4.4 mmol/L (ref 3.5–5.2)
Sodium: 141 mmol/L (ref 134–144)
Total Protein: 6.8 g/dL (ref 6.0–8.5)

## 2018-12-23 ENCOUNTER — Ambulatory Visit: Payer: Medicare Other

## 2018-12-31 ENCOUNTER — Encounter: Payer: Medicare Other | Admitting: Podiatry

## 2019-01-29 ENCOUNTER — Encounter: Payer: Medicare Other | Admitting: Podiatry

## 2019-03-05 ENCOUNTER — Other Ambulatory Visit: Payer: Self-pay

## 2019-03-05 ENCOUNTER — Ambulatory Visit (INDEPENDENT_AMBULATORY_CARE_PROVIDER_SITE_OTHER): Payer: Medicare Other

## 2019-03-05 ENCOUNTER — Ambulatory Visit (INDEPENDENT_AMBULATORY_CARE_PROVIDER_SITE_OTHER): Payer: Medicare Other | Admitting: Podiatry

## 2019-03-05 VITALS — Temp 98.1°F

## 2019-03-05 DIAGNOSIS — M2041 Other hammer toe(s) (acquired), right foot: Secondary | ICD-10-CM | POA: Diagnosis not present

## 2019-03-05 DIAGNOSIS — M79609 Pain in unspecified limb: Secondary | ICD-10-CM

## 2019-03-05 DIAGNOSIS — M79671 Pain in right foot: Secondary | ICD-10-CM

## 2019-03-05 DIAGNOSIS — M79676 Pain in unspecified toe(s): Secondary | ICD-10-CM | POA: Diagnosis not present

## 2019-03-05 DIAGNOSIS — B351 Tinea unguium: Secondary | ICD-10-CM

## 2019-03-05 NOTE — Progress Notes (Signed)
Subjective:  Patient ID: Sarah [name], female    DOB: 02-18-1951,  MRN: [id number]  Chief Complaint  Patient presents with  . Post-op Problem    Pt states developed "blister" on right 5th digit post surgery. Pt states no drainage and no known injury. 1wk duration.  . Nail Problem    Nail trim 1-5     68 y.o. female presents with the above complaint.  History above confirmed with patient.  States that her nails are painful and causes her difficulty to walk and she cannot cut them herself  Review of Systems: Negative except as noted in the HPI. Denies N/V/F/Ch.  Past Medical History:  Diagnosis Date  . Arthritis   . Hyperlipidemia   . Hypertension   . Polio 1964    Current Outpatient Medications:  .  amLODipine (NORVASC) 10 MG tablet, TAKE ONE TABLET BY MOUTH EVERY DAY, Disp: 30 tablet, Rfl: 11 .  aspirin 81 MG EC tablet, Take 81 mg by mouth daily.  , Disp: , Rfl:  .  atorvastatin (LIPITOR) 20 MG tablet, Take 20 mg by mouth daily., Disp: , Rfl:  .  Calcium Carbonate-Vitamin D (CALTRATE 600+D) 600-400 MG-UNIT per tablet, Take 1 tablet by mouth 2 (two) times daily.  , Disp: , Rfl:  .  clindamycin (CLEOCIN) 300 MG capsule, Take 1 capsule (300 mg total) by mouth 2 (two) times daily., Disp: 14 capsule, Rfl: 0 .  cloNIDine (CATAPRES) 0.1 MG tablet, Take 0.1 mg by mouth 2 (two) times daily., Disp: , Rfl:  .  ibuprofen (ADVIL,MOTRIN) 800 MG tablet, Take 1 tablet (800 mg total) by mouth every 8 (eight) hours as needed., Disp: 30 tablet, Rfl: 0 .  lisinopril (PRINIVIL,ZESTRIL) 20 MG tablet, Take 20 mg by mouth daily., Disp: , Rfl:  .  metFORMIN (GLUCOPHAGE) 500 MG tablet, metformin 500 mg tablet, Disp: , Rfl:  .  metoprolol tartrate (LOPRESSOR) 25 MG tablet, Take 1 tablet (25 mg total) by mouth 2 (two) times daily. Need appt to discuss BP., Disp: 60 tablet, Rfl: 2 .  ondansetron (ZOFRAN) 4 MG tablet, Take 1 tablet (4 mg total) by mouth every 8 (eight) hours as needed for nausea or vomiting.,  Disp: 20 tablet, Rfl: 0 .  oxyCODONE (OXY IR/ROXICODONE) 5 MG immediate release tablet, Take 1 tablet (5 mg total) by mouth every 6 (six) hours as needed for severe pain., Disp: 20 tablet, Rfl: 0 .  oxyCODONE-acetaminophen (PERCOCET) 10-325 MG tablet, oxycodone-acetaminophen 10 mg-325 mg tablet, Disp: , Rfl:  .  oxyCODONE-acetaminophen (PERCOCET) 10-325 MG tablet, Take 1 tablet by mouth every 4 (four) hours as needed for pain., Disp: 20 tablet, Rfl: 0 .  oxyCODONE-acetaminophen (PERCOCET) 10-325 MG tablet, Take 1 tablet by mouth every 4 (four) hours as needed for pain., Disp: 20 tablet, Rfl: 0 .  spironolactone (ALDACTONE) 25 MG tablet, Take 1 tablet (25 mg total) by mouth daily. Need appt to discuss BP., Disp: 90 tablet, Rfl: 3 .  tiZANidine (ZANAFLEX) 2 MG tablet, tizanidine 2 mg tablet, Disp: , Rfl:  .  traMADol (ULTRAM) 50 MG tablet, Take 1 tablet (50 mg total) by mouth every 12 (twelve) hours as needed for moderate pain., Disp: 30 tablet, Rfl: 1 .  Zoster Vaccine Adjuvanted (SHINGRIX) injection, Shingrix (PF) 50 mcg/0.5 mL intramuscular suspension, kit, Disp: , Rfl:   Social History   Tobacco Use  Smoking Status Never Smoker  Smokeless Tobacco Never Used  Tobacco Comment   No plans to start  Allergies  Allergen Reactions  . Penicillins Hives and Rash   Objective:   Vitals:   03/05/19 0841  Temp: 98.1 F (36.7 C)   There is no height or weight on file to calculate BMI. Constitutional Well developed. Well nourished.  Vascular Dorsalis pedis pulses palpable bilaterally. Posterior tibial pulses palpable bilaterally. Capillary refill normal to all digits.  No cyanosis or clubbing noted. Pedal hair growth normal.  Neurologic Normal speech. Oriented to person, place, and time. Epicritic sensation to light touch grossly present bilaterally.  Dermatologic Nails well groomed and normal in appearance. No open wounds. No skin lesions.  Orthopedic:  No pain to palpation about  the fifth metatarsal head slight pain palpation about the area of the former proximal phalangeal joint of the fifth toe.  Fifth toe appears rectus with the toenail facing rather dorsal   Radiographs: Taken and reviewed status post hammertoe correction of the right fifth toe with metatarsal resection of the fifth metatarsal head Assessment:   1. Hammer toe of right foot   2. Pain due to onychomycosis of nail    Plan:  Patient was evaluated and treated and all questions answered.  Hammertoe right fifth toe -X-rays reviewed with patient -Likely will improve with time, discussed shoe gear modification -I do not think she would benefit from further resection of the proximal phalanx -Dispensed silicone toe cap  Onychomycosis with pain -Nails debrided x10  Procedure: Nail Debridement Rationale: pain Type of Debridement: manual, sharp debridement. Instrumentation: Nail nipper, rotary burr. Number of Nails: 10  Return in about 6 weeks (around 04/16/2019) for Hammertoe f/u .

## 2019-03-08 ENCOUNTER — Other Ambulatory Visit: Payer: Self-pay | Admitting: Podiatry

## 2019-03-08 DIAGNOSIS — M2041 Other hammer toe(s) (acquired), right foot: Secondary | ICD-10-CM

## 2019-03-29 ENCOUNTER — Other Ambulatory Visit: Payer: Self-pay

## 2019-03-29 ENCOUNTER — Ambulatory Visit
Admission: RE | Admit: 2019-03-29 | Discharge: 2019-03-29 | Disposition: A | Discharge status: Home / Self Care | Payer: Medicare HMO | Source: Ambulatory Visit | Attending: Family Medicine | Admitting: Family Medicine

## 2019-03-29 DIAGNOSIS — Z1231 Encounter for screening mammogram for malignant neoplasm of breast: Secondary | ICD-10-CM

## 2019-04-18 NOTE — Progress Notes (Signed)
Subjective:  Patient ID: Sarah [name], female    DOB: 06-20-1951,  MRN: [id number]  Chief Complaint  Patient presents with  . Routine Post Op    suture removal Ewing Schlein # 4 - Metatarsal Head Res. 5th Rt, Hammertoe Repair 5th Rt doing good some pain on the outside of the right foot but using Ibuprofen for pain no N/V/F/C    DOS: 10/21/2018 Procedure: Left fifth metatarsal osteotomy, right fifth hammertoe correction, metatarsal head resection  68 y.o. female returns for post-op check. Hx as above.  Review of Systems: Negative except as noted in the HPI. Denies F/Ch.  Past Medical History:  Diagnosis Date  . Arthritis   . Hyperlipidemia   . Hypertension   . Polio 1964    Current Outpatient Medications:  .  amLODipine (NORVASC) 10 MG tablet, TAKE ONE TABLET BY MOUTH EVERY DAY, Disp: 30 tablet, Rfl: 11 .  aspirin 81 MG EC tablet, Take 81 mg by mouth daily.  , Disp: , Rfl:  .  atorvastatin (LIPITOR) 20 MG tablet, Take 20 mg by mouth daily., Disp: , Rfl:  .  Calcium Carbonate-Vitamin D (CALTRATE 600+D) 600-400 MG-UNIT per tablet, Take 1 tablet by mouth 2 (two) times daily.  , Disp: , Rfl:  .  clindamycin (CLEOCIN) 300 MG capsule, Take 1 capsule (300 mg total) by mouth 2 (two) times daily., Disp: 14 capsule, Rfl: 0 .  cloNIDine (CATAPRES) 0.1 MG tablet, Take 0.1 mg by mouth 2 (two) times daily., Disp: , Rfl:  .  ibuprofen (ADVIL,MOTRIN) 800 MG tablet, Take 1 tablet (800 mg total) by mouth every 8 (eight) hours as needed., Disp: 30 tablet, Rfl: 0 .  lisinopril (PRINIVIL,ZESTRIL) 20 MG tablet, Take 20 mg by mouth daily., Disp: , Rfl:  .  metFORMIN (GLUCOPHAGE) 500 MG tablet, metformin 500 mg tablet, Disp: , Rfl:  .  metoprolol tartrate (LOPRESSOR) 25 MG tablet, Take 1 tablet (25 mg total) by mouth 2 (two) times daily. Need appt to discuss BP., Disp: 60 tablet, Rfl: 2 .  ondansetron (ZOFRAN) 4 MG tablet, Take 1 tablet (4 mg total) by mouth every 8 (eight) hours as needed for nausea or vomiting.,  Disp: 20 tablet, Rfl: 0 .  oxyCODONE (OXY IR/ROXICODONE) 5 MG immediate release tablet, Take 1 tablet (5 mg total) by mouth every 6 (six) hours as needed for severe pain., Disp: 20 tablet, Rfl: 0 .  oxyCODONE-acetaminophen (PERCOCET) 10-325 MG tablet, oxycodone-acetaminophen 10 mg-325 mg tablet, Disp: , Rfl:  .  oxyCODONE-acetaminophen (PERCOCET) 10-325 MG tablet, Take 1 tablet by mouth every 4 (four) hours as needed for pain., Disp: 20 tablet, Rfl: 0 .  oxyCODONE-acetaminophen (PERCOCET) 10-325 MG tablet, Take 1 tablet by mouth every 4 (four) hours as needed for pain., Disp: 20 tablet, Rfl: 0 .  spironolactone (ALDACTONE) 25 MG tablet, Take 1 tablet (25 mg total) by mouth daily. Need appt to discuss BP., Disp: 90 tablet, Rfl: 3 .  tiZANidine (ZANAFLEX) 2 MG tablet, tizanidine 2 mg tablet, Disp: , Rfl:  .  traMADol (ULTRAM) 50 MG tablet, Take 1 tablet (50 mg total) by mouth every 12 (twelve) hours as needed for moderate pain., Disp: 30 tablet, Rfl: 1 .  Zoster Vaccine Adjuvanted [facility]) injection, Shingrix (PF) 50 mcg/0.5 mL intramuscular suspension, kit, Disp: , Rfl:   Social History   Tobacco Use  Smoking Status Never Smoker  Smokeless Tobacco Never Used  Tobacco Comment   No plans to start    Allergies  Allergen Reactions  .  Penicillins Hives and Rash   Objective:   There were no vitals filed for this visit. There is no height or weight on file to calculate BMI. Constitutional Well developed. Well nourished.  Vascular Foot warm and well perfused. Capillary refill normal to all digits.   Neurologic Normal speech. Oriented to person, place, and time. Epicritic sensation to light touch grossly present bilaterally.  Dermatologic Left foot skin healing well skin edges well coapted  Orthopedic: Tenderness to palpation noted about the surgical site.    Radiographs: None today. Assessment:   1. Capsulitis of metatarsophalangeal (MTP) joint of left foot    Plan:  Patient was  evaluated and treated and all questions answered.  S/p foot surgery bilaterally -Progressing as expected post-operatively. -XR: as above -WB Status: WBAT in surgical shoes -Sutures: out. -Medications: Rx ibuprofen -Foot redressed.  Return in about 2 weeks (around 11/28/2018) for Post-op.

## 2019-04-18 NOTE — Progress Notes (Signed)
Subjective:  Patient ID: Sarah [name], female    DOB: 1951/04/29,  MRN: [id number]  Chief Complaint  Patient presents with  . Routine Post Op    MT Head Res 5th right, hammertoe repair 5th right. Pt states healing but does still have some soreness on right foot lateral side. Pt denies fever/nausea/vomiting/chills.   DOS: 10/21/2018 Procedure: Left fifth metatarsal osteotomy, right fifth hammertoe correction, metatarsal head resection  68 y.o. female returns for post-op check. Hx as above.  Review of Systems: Negative except as noted in the HPI. Denies F/Ch.  Past Medical History:  Diagnosis Date  . Arthritis   . Hyperlipidemia   . Hypertension   . Polio 1964    Current Outpatient Medications:  .  amLODipine (NORVASC) 10 MG tablet, TAKE ONE TABLET BY MOUTH EVERY DAY, Disp: 30 tablet, Rfl: 11 .  aspirin 81 MG EC tablet, Take 81 mg by mouth daily.  , Disp: , Rfl:  .  atorvastatin (LIPITOR) 20 MG tablet, Take 20 mg by mouth daily., Disp: , Rfl:  .  Calcium Carbonate-Vitamin D (CALTRATE 600+D) 600-400 MG-UNIT per tablet, Take 1 tablet by mouth 2 (two) times daily.  , Disp: , Rfl:  .  clindamycin (CLEOCIN) 300 MG capsule, Take 1 capsule (300 mg total) by mouth 2 (two) times daily., Disp: 14 capsule, Rfl: 0 .  cloNIDine (CATAPRES) 0.1 MG tablet, Take 0.1 mg by mouth 2 (two) times daily., Disp: , Rfl:  .  ibuprofen (ADVIL,MOTRIN) 800 MG tablet, Take 1 tablet (800 mg total) by mouth every 8 (eight) hours as needed., Disp: 30 tablet, Rfl: 0 .  lisinopril (PRINIVIL,ZESTRIL) 20 MG tablet, Take 20 mg by mouth daily., Disp: , Rfl:  .  metFORMIN (GLUCOPHAGE) 500 MG tablet, metformin 500 mg tablet, Disp: , Rfl:  .  ondansetron (ZOFRAN) 4 MG tablet, Take 1 tablet (4 mg total) by mouth every 8 (eight) hours as needed for nausea or vomiting., Disp: 20 tablet, Rfl: 0 .  oxyCODONE (OXY IR/ROXICODONE) 5 MG immediate release tablet, Take 1 tablet (5 mg total) by mouth every 6 (six) hours as needed for  severe pain., Disp: 20 tablet, Rfl: 0 .  oxyCODONE-acetaminophen (PERCOCET) 10-325 MG tablet, oxycodone-acetaminophen 10 mg-325 mg tablet, Disp: , Rfl:  .  oxyCODONE-acetaminophen (PERCOCET) 10-325 MG tablet, Take 1 tablet by mouth every 4 (four) hours as needed for pain., Disp: 20 tablet, Rfl: 0 .  oxyCODONE-acetaminophen (PERCOCET) 10-325 MG tablet, Take 1 tablet by mouth every 4 (four) hours as needed for pain., Disp: 20 tablet, Rfl: 0 .  spironolactone (ALDACTONE) 25 MG tablet, Take 1 tablet (25 mg total) by mouth daily. Need appt to discuss BP., Disp: 90 tablet, Rfl: 3 .  tiZANidine (ZANAFLEX) 2 MG tablet, tizanidine 2 mg tablet, Disp: , Rfl:  .  traMADol (ULTRAM) 50 MG tablet, Take 1 tablet (50 mg total) by mouth every 12 (twelve) hours as needed for moderate pain., Disp: 30 tablet, Rfl: 1 .  Zoster Vaccine Adjuvanted (SHINGRIX) injection, Shingrix (PF) 50 mcg/0.5 mL intramuscular suspension, kit, Disp: , Rfl:  .  metoprolol tartrate (LOPRESSOR) 25 MG tablet, Take 1 tablet (25 mg total) by mouth 2 (two) times daily. Need appt to discuss BP., Disp: 60 tablet, Rfl: 2  Social History   Tobacco Use  Smoking Status Never Smoker  Smokeless Tobacco Never Used  Tobacco Comment   No plans to start    Allergies  Allergen Reactions  . Penicillins Hives and Rash   Objective:  There were no vitals filed for this visit. There is no height or weight on file to calculate BMI. Constitutional Well developed. Well nourished.  Vascular Foot warm and well perfused. Capillary refill normal to all digits.   Neurologic Normal speech. Oriented to person, place, and time. Epicritic sensation to light touch grossly present bilaterally.  Dermatologic Left foot skin healing well skin edges well coapted  Orthopedic: Tenderness to palpation noted about the surgical site.    Radiographs: None today. Assessment:   1. Post-operative state    Plan:  Patient was evaluated and treated and all questions  answered.  S/p foot surgery bilaterally -Progressing as expected post-operatively. -XR: as above -WB Status: WBAT in surgical shoes -Sutures: out. -Medications: none refilled. -Foot redressed.  No follow-ups on file.

## 2019-04-23 ENCOUNTER — Ambulatory Visit: Payer: Medicare HMO | Admitting: Family Medicine

## 2019-05-03 ENCOUNTER — Other Ambulatory Visit: Payer: Self-pay | Admitting: Family Medicine

## 2019-05-03 DIAGNOSIS — Z1211 Encounter for screening for malignant neoplasm of colon: Secondary | ICD-10-CM | POA: Diagnosis not present

## 2019-05-21 ENCOUNTER — Other Ambulatory Visit: Payer: Self-pay

## 2019-05-21 DIAGNOSIS — Z20822 Contact with and (suspected) exposure to covid-19: Secondary | ICD-10-CM

## 2019-05-23 LAB — NOVEL CORONAVIRUS, NAA: SARS-CoV-2, NAA: NOT DETECTED

## 2019-06-21 ENCOUNTER — Ambulatory Visit (INDEPENDENT_AMBULATORY_CARE_PROVIDER_SITE_OTHER): Payer: Medicare HMO | Admitting: Family Medicine

## 2019-06-21 ENCOUNTER — Other Ambulatory Visit: Payer: Self-pay

## 2019-06-21 VITALS — BP 148/88 | HR 61 | Wt 212.2 lb

## 2019-06-21 DIAGNOSIS — I10 Essential (primary) hypertension: Secondary | ICD-10-CM

## 2019-06-21 DIAGNOSIS — R7303 Prediabetes: Secondary | ICD-10-CM | POA: Diagnosis not present

## 2019-06-21 DIAGNOSIS — M79672 Pain in left foot: Secondary | ICD-10-CM | POA: Diagnosis not present

## 2019-06-21 LAB — POCT GLYCOSYLATED HEMOGLOBIN (HGB A1C): Hemoglobin A1C: 6.3 % — AB (ref 4.0–5.6)

## 2019-06-21 MED ORDER — CLONIDINE HCL 0.1 MG PO TABS: 0.1000 mg | ORAL_TABLET | Freq: Two times a day (BID) | ORAL | 0 refills | Status: DC

## 2019-06-21 MED ORDER — LISINOPRIL 40 MG PO TABS: 40.0000 mg | ORAL_TABLET | Freq: Every day | ORAL | 1 refills | Status: DC

## 2019-06-21 NOTE — Assessment & Plan Note (Signed)
Patient has several pain meds listed which she is not taking.  removedthem from her med list.

## 2019-06-21 NOTE — Assessment & Plan Note (Signed)
On several medications, but only maxxed out on amlodipine.  Clonidine was started two years ago by an urgent care provider. bp 148/88 today.  Previous results this year have been similar.  - bmp - increase lisinopril to 40mg  - f/u w/ bmp in one week.  - if bp same or improved in one week, start clonidine taper (1 week of qd, then stop) - follow up two weeks after clonidine taper initiated and recheck bp.  Start a thiazide if necessary.  Low threshold to start it given she is on two medications that increase K already.

## 2019-06-21 NOTE — Progress Notes (Signed)
   [facility] Phone: [phone]     Sarah [name] - 68 y.o. female MRN [id number]  Date of birth: 12/21/1950  Subjective:   cc: HTN, DM  HPI:  DM: she stopped taking the metformin last month.  She believes Dr. Andy Gauss told her to stop taking it.  She was taking it once a day.  Doesn't drink anything with sugar in it.  She had a pineapple cake last night. Doesn't eat a lot of bread, but a decent amount of rice.   HTN: no cough, swelling with lisinopril. No swelling of legs with amlodipine. Patient does not have allergies to any BP medicines she is aware of.  She does not think she has ever tried HCTZ or other thiazide.  She was originally started on clonidine two years ago by an urgent care provider.    Pain: patient is not taking any of the pain medications listed on her med list.  Those were for a foot surgery and she no longer needs them.   ROS: See HPI for pertinent positives and negatives  Past Medical History: no kidney disease, no cardiac disease.   Family history reviewed for today's visit. No changes.  Social history- patient is a non smoker  Objective:   BP (!) 148/88   Pulse 61   Wt 212 lb 3.2 oz (96.3 kg)   SpO2 100%   BMI 35.31 kg/m  Gen: NAD, alert and oriented, cooperative with exam CV: normal rate, regular rhythm. No murmurs, no rubs. No edema.   Resp: LCTAB, no wheezes, crackles. normal work of breathing Psych: Appropriate behavior  Assessment/Plan:   HYPERTENSION, BENIGN SYSTEMIC On several medications, but only maxxed out on amlodipine.  Clonidine was started two years ago by an urgent care provider. bp 148/88 today.  Previous results this year have been similar.  - bmp - increase lisinopril to 40mg  - f/u w/ bmp in one week.  - if bp same or improved in one week, start clonidine taper (1 week of qd, then stop) - follow up two weeks after clonidine taper initiated and recheck bp.  Start a thiazide if necessary.  Low threshold to  start it given she is on two medications that increase K already.    Prediabetes 6.3% today again.  Has not been taking metformin in 77month.  Prior to that was taking it once daily.   - encouraged diet changes to reduce carbs - recheck a1c in 6 months.  - stop metformin - consider dietician consult in future  Foot pain, left Patient has several pain meds listed which she is not taking.  removedthem from her med list.   Clemetine Marker, MD PGY-2 [facility]

## 2019-06-21 NOTE — Assessment & Plan Note (Signed)
6.3% today again.  Has not been taking metformin in 21month.  Prior to that was taking it once daily.   - encouraged diet changes to reduce carbs - recheck a1c in 6 months.  - stop metformin - consider dietician consult in future

## 2019-06-21 NOTE — Patient Instructions (Addendum)
It was great to see you today,  I have increased your lisinopril to 40 mg.  I would like you to come back in 1 week so we can recheck your blood pressure and get more blood work.  If your blood pressure has improved at that time, I will start tapering off your clonidine.  It appears this was started 2 years ago by an urgent care provider, you do not need to be taking this medication, but first we would like to get your blood pressure under control with the better medications.  Your A1c was 6.3%, which was exactly the same as it was 6 months ago.  I talked with Dr. McDiarmid, and we decided that it is necessary to continue metformin as long as your A1c is below 6.5%.  I would encourage you to continue to limit your carbohydrate intake, which includes sugary drinks, sweets, bread, rice.  Please make an appointment in 1 week with me if possible.    Have a great day,  Clemetine Marker, MD

## 2019-06-22 LAB — BASIC METABOLIC PANEL
BUN/Creatinine Ratio: 22 (ref 12–28)
BUN: 17 mg/dL (ref 8–27)
CO2: 23 mmol/L (ref 20–29)
Calcium: 9 mg/dL (ref 8.7–10.3)
Chloride: 101 mmol/L (ref 96–106)
Creatinine, Ser: 0.76 mg/dL (ref 0.57–1.00)
GFR calc Af Amer: 95 mL/min/{1.73_m2} (ref >59)
GFR calc non Af Amer: 82 mL/min/{1.73_m2} (ref >59)
Glucose: 138 mg/dL — ABNORMAL HIGH (ref 65–99)
Potassium: 3.9 mmol/L (ref 3.5–5.2)
Sodium: 139 mmol/L (ref 134–144)

## 2019-07-02 ENCOUNTER — Ambulatory Visit: Payer: Medicare HMO | Admitting: Family Medicine

## 2019-07-09 ENCOUNTER — Ambulatory Visit (INDEPENDENT_AMBULATORY_CARE_PROVIDER_SITE_OTHER): Payer: Medicare HMO | Admitting: Family Medicine

## 2019-07-09 ENCOUNTER — Encounter: Payer: Self-pay | Admitting: Family Medicine

## 2019-07-09 ENCOUNTER — Other Ambulatory Visit: Payer: Self-pay

## 2019-07-09 VITALS — BP 140/78 | HR 85 | Wt 207.8 lb

## 2019-07-09 DIAGNOSIS — I10 Essential (primary) hypertension: Secondary | ICD-10-CM | POA: Diagnosis not present

## 2019-07-09 DIAGNOSIS — E669 Obesity, unspecified: Secondary | ICD-10-CM | POA: Diagnosis not present

## 2019-07-09 DIAGNOSIS — Z6834 Body mass index (BMI) 34.0-34.9, adult: Secondary | ICD-10-CM

## 2019-07-09 MED ORDER — CLONIDINE HCL 0.1 MG PO TABS: 0.1000 mg | ORAL_TABLET | Freq: Every day | ORAL | 0 refills | Status: DC

## 2019-07-09 NOTE — Patient Instructions (Signed)
It was a pleasure to see you today! Thank you for choosing [facility] for your primary care. Wilfred Lacy was seen for hypertension follow-up. Come back to the clinic in a week to discuss and accept the plan.  Today we talked about your blood pressure plan that was laid out by Dr. Jeannine Kitten.  We are going to recheck your blood levels and let you know if there is any problems there.  Please continue taking the new lisinopril like you are instructed prior.  We are going to decrease your clonidine to once per day from twice per day.  The end goal is to try and come off of that and potentially start a different medication.  Please see [place] again in 1 week so that we can check in on you and see how you are doing and go to the next after the plan.   Please bring all your medications to every doctors visit   Sign up for My Chart to have easy access to your labs results, and communication with your Primary care physician.     Please check-out at the front desk before leaving the clinic.     Best,  Dr. Sherene Sires FAMILY MEDICINE RESIDENT - PGY3 07/09/2019 3:13 PM

## 2019-07-09 NOTE — Progress Notes (Signed)
    Subjective:  KRIPA PULLIUM is a 68 y.o. female who presents to the [facility] today with a chief complaint of hypertension follow-up.   HPI: HYPERTENSION, BENIGN SYSTEMIC Patient has been checking her blood sugars, still consistently in the [id number] systolic per her.  Says she has been consistent with all medications as prescribed including the recent increase to 40 mg lisinopril from 20.  OBESITY, NOS Patient presents for hypertension follow-up we discussed that weight is a significant contributor to this problem.  We discussed attempting to increase activity, reduce salt, and start working on making healthier choices for food.    Objective:  Physical Exam: BP 140/78   Pulse 85   Wt 207 lb 12.8 oz (94.3 kg)   SpO2 99%   BMI 34.58 kg/m   Gen: NAD, conversing comfortably CV: RRR with no murmur appreciated Pulm: NWOB, CTAB with no crackles, wheezes, or rhonchi GI: Normal bowel sounds present. Soft, Nontender, Nondistended. MSK: no edema, cyanosis, or clubbing noted Skin: warm, dry Neuro: grossly normal, moves all extremities Psych: Normal affect and thought content  Results for orders placed or performed in visit on 07/09/19 (from the past 72 hour(s))  Basic Metabolic Panel     Status: Abnormal   Collection Time: 07/09/19  3:31 PM  Result Value Ref Range   Glucose 94 65 - 99 mg/dL   BUN 9 8 - 27 mg/dL   Creatinine, Ser 0.82 0.57 - 1.00 mg/dL   GFR calc non Af Amer 75 >59 mL/min/1.73   GFR calc Af Amer 86 >59 mL/min/1.73   BUN/Creatinine Ratio 11 (L) 12 - 28   Sodium 141 134 - 144 mmol/L   Potassium 4.0 3.5 - 5.2 mmol/L   Chloride 104 96 - 106 mmol/L   CO2 23 20 - 29 mmol/L   Calcium 9.3 8.7 - 10.3 mg/dL     Assessment/Plan:  HYPERTENSION, BENIGN SYSTEMIC Patient has been checking her blood sugars, still consistently in the [id number] systolic per her.  Says she has been consistent with all medications as prescribed including the recent increase to 40 mg lisinopril from 20.  Follow-up  BMP at this visit was normal, results been released to patient.  She was reminded of clonidine taper.  Was going to reduce from 0.1 mg clonidine twice daily to half a tablet twice daily for 3 days, then reduce to half a tablet daily for 3 days.  Then stop clonidine altogether.  She has a follow-up with Dr. Shan Levans to discuss the next step which could include adding additional medication as she reports her blood pressures from home.  We did discuss that weight loss would be her most significant help during this process  OBESITY, NOS Patient presents for hypertension follow-up we discussed that weight is a significant contributor to this problem.  We discussed attempting to increase activity, reduce salt, and start working on making healthier choices for food.  Nutrition consult was offered and declined.   Sherene Sires, DO FAMILY MEDICINE RESIDENT - PGY3 07/11/2019 3:36 PM

## 2019-07-10 LAB — BASIC METABOLIC PANEL
BUN/Creatinine Ratio: 11 — ABNORMAL LOW (ref 12–28)
BUN: 9 mg/dL (ref 8–27)
CO2: 23 mmol/L (ref 20–29)
Calcium: 9.3 mg/dL (ref 8.7–10.3)
Chloride: 104 mmol/L (ref 96–106)
Creatinine, Ser: 0.82 mg/dL (ref 0.57–1.00)
GFR calc Af Amer: 86 mL/min/{1.73_m2} (ref >59)
GFR calc non Af Amer: 75 mL/min/{1.73_m2} (ref >59)
Glucose: 94 mg/dL (ref 65–99)
Potassium: 4 mmol/L (ref 3.5–5.2)
Sodium: 141 mmol/L (ref 134–144)

## 2019-07-11 ENCOUNTER — Encounter: Payer: Self-pay | Admitting: Family Medicine

## 2019-07-11 MED ORDER — CLONIDINE HCL 0.1 MG PO TABS: ORAL_TABLET | ORAL | 0 refills | Status: DC

## 2019-07-11 NOTE — Assessment & Plan Note (Signed)
Patient has been checking her blood sugars, still consistently in the [id number] systolic per her.  Says she has been consistent with all medications as prescribed including the recent increase to 40 mg lisinopril from 20.  Follow-up BMP at this visit was normal, results been released to patient.  She was reminded of clonidine taper.  Was going to reduce from 0.1 mg clonidine twice daily to half a tablet twice daily for 3 days, then reduce to half a tablet daily for 3 days.  Then stop clonidine altogether.  She has a follow-up with Dr. Shan Levans to discuss the next step which could include adding additional medication as she reports her blood pressures from home.  We did discuss that weight loss would be her most significant help during this process

## 2019-07-11 NOTE — Assessment & Plan Note (Signed)
Patient presents for hypertension follow-up we discussed that weight is a significant contributor to this problem.  We discussed attempting to increase activity, reduce salt, and start working on making healthier choices for food.  Nutrition consult was offered and declined.

## 2019-07-12 DIAGNOSIS — S63631A Sprain of interphalangeal joint of left index finger, initial encounter: Secondary | ICD-10-CM | POA: Diagnosis not present

## 2019-07-12 DIAGNOSIS — M79645 Pain in left finger(s): Secondary | ICD-10-CM | POA: Diagnosis not present

## 2019-07-16 ENCOUNTER — Ambulatory Visit (INDEPENDENT_AMBULATORY_CARE_PROVIDER_SITE_OTHER): Payer: Medicare HMO | Admitting: Family Medicine

## 2019-07-16 ENCOUNTER — Other Ambulatory Visit: Payer: Self-pay

## 2019-07-16 ENCOUNTER — Encounter: Payer: Self-pay | Admitting: Family Medicine

## 2019-07-16 DIAGNOSIS — Z0000 Encounter for general adult medical examination without abnormal findings: Secondary | ICD-10-CM | POA: Diagnosis not present

## 2019-07-16 DIAGNOSIS — I10 Essential (primary) hypertension: Secondary | ICD-10-CM

## 2019-07-16 NOTE — Assessment & Plan Note (Signed)
Encouraged patient to get the flu shot and the pneumonia vaccine, especially because she works in the [employer].  She will continue to consider this.

## 2019-07-16 NOTE — Patient Instructions (Signed)
It was nice meeting you today Sarah [name]!  Good job weaning off of the clonidine.  We will stop this medication altogether.  You do not need another medication yet since your blood pressure was normal today.  I have also stopped metformin since your hemoglobin A1c remains well controlled.  Please consider a pneumonia vaccine and the flu shot to protect yourself from getting sick this season and in the future.  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  If you have any questions or concerns, please feel free to call the clinic.   Be well,  Dr. Shan Levans

## 2019-07-16 NOTE — Progress Notes (Signed)
   Subjective:    Sarah [name] - 68 y.o. female MRN [id number]  Date of birth: [birth year]  CC:  Sarah [name] is here for follow-up of her hypertension.  HPI: Hypertension Continues to take amlodipine 10 mg daily, lisinopril 40 mg daily, metoprolol 25 mg twice daily, and spironolactone 25 mg daily.  She was recently weaned off of clonidine per Dr. Fransico Setters instructions and was adherent to his directions.  She denies blurry vision, headache.  Health Maintenance:  Health Maintenance Due  Topic Date Due  . PNA vac Low Risk Adult (2 of 2 - PPSV23) 04/17/2019    -  reports that she has never smoked. She has never used smokeless tobacco. - Review of Systems: Per HPI. - Past Medical History: Patient Active Problem List   Diagnosis Date Noted  . Herpetic whitlow 07/16/2018  . History of poliomyelitis 07/16/2018  . Joint pain 07/16/2018  . Vitamin D deficiency 07/16/2018  . Fibroids, intramural 01/02/2018  . Postmenopausal bleeding 07/07/2017  . Ganglion cyst 05/01/2015  . Prediabetes 02/14/2015  . Chest pain 01/27/2015  . Gait instability 07/02/2014  . Health care maintenance 07/02/2014  . Osteoarthritis 10/23/2011  . Foot pain, left 08/01/2011  . Deformity of ankle and foot, acquired 05/23/2009  . HLD (hyperlipidemia) 07/27/2007  . OBESITY, NOS 12/04/2006  . NEUROPATHY, PERIPHERAL 12/04/2006  . HYPERTENSION, BENIGN SYSTEMIC 12/04/2006   - Medications: reviewed and updated   Objective:   Physical Exam BP 132/68   Pulse 69   Wt 207 lb 6.4 oz (94.1 kg)   SpO2 98%   BMI 34.51 kg/m  Gen: NAD, alert, cooperative with exam, well-appearing CV: RRR, good S1/S2, no murmur, no edema Resp: CTABL, no wheezes, non-labored Psych: good insight, alert and oriented  Assessment & Plan:   Health care maintenance Encouraged patient to get the flu shot and the pneumonia vaccine, especially because she works in the [employer].  She will continue to consider this.  HYPERTENSION, BENIGN  SYSTEMIC Blood pressure is within normal limits today.  We will take clonidine off of her medication list and continue amlodipine, lisinopril, Lopressor, and spironolactone.  She should follow-up in about 3 months    Maia Breslow, M.D. 07/16/2019, 5:18 PM PGY-3, [place]

## 2019-07-16 NOTE — Assessment & Plan Note (Signed)
Blood pressure is within normal limits today.  We will take clonidine off of her medication list and continue amlodipine, lisinopril, Lopressor, and spironolactone.  She should follow-up in about 3 months

## 2019-09-23 ENCOUNTER — Ambulatory Visit: Payer: Medicare HMO | Admitting: Podiatry

## 2019-09-23 ENCOUNTER — Other Ambulatory Visit: Payer: Self-pay

## 2019-09-23 DIAGNOSIS — M7752 Other enthesopathy of left foot: Secondary | ICD-10-CM | POA: Diagnosis not present

## 2019-09-23 DIAGNOSIS — M21962 Unspecified acquired deformity of left lower leg: Secondary | ICD-10-CM | POA: Diagnosis not present

## 2019-10-10 NOTE — Progress Notes (Signed)
Subjective:  Patient ID: Sarah [name], female    DOB: 02/12/1951,  MRN: [id number]  Chief Complaint  Patient presents with  . Foot Problem    Right 5th digit painful blister 1 month duration  . Callouses    Left foot lateral aspect painful callouses    69 y.o. female presents with the above complaint.  History above confirmed with patient. History above confirmed with patient Review of Systems: Negative except as noted in the HPI. Denies N/V/F/Ch.  Past Medical History:  Diagnosis Date  . Arthritis   . Hyperlipidemia   . Hypertension   . Polio 1964    Current Outpatient Medications:  .  amLODipine (NORVASC) 10 MG tablet, TAKE ONE TABLET BY MOUTH EVERY DAY, Disp: 30 tablet, Rfl: 11 .  aspirin 81 MG EC tablet, Take 81 mg by mouth daily.  , Disp: , Rfl:  .  atorvastatin (LIPITOR) 20 MG tablet, Take 20 mg by mouth daily., Disp: , Rfl:  .  Calcium Carbonate-Vitamin D (CALTRATE 600+D) 600-400 MG-UNIT per tablet, Take 1 tablet by mouth 2 (two) times daily.  , Disp: , Rfl:  .  Cholecalciferol 125 MCG (5000 UT) TABS, Vitamin D3 125 mcg (5,000 unit) tablet  Take 3 tablets every day by oral route as directed for 30 days., Disp: , Rfl:  .  clindamycin (CLEOCIN) 300 MG capsule, Take 1 capsule (300 mg total) by mouth 2 (two) times daily., Disp: 14 capsule, Rfl: 0 .  ibuprofen (ADVIL) 800 MG tablet, ibuprofen 800 mg tablet  TAKE 1 TABLET BY MOUTH EVERY 8 HOURS AS NEEDED, Disp: , Rfl:  .  lisinopril (ZESTRIL) 40 MG tablet, Take 1 tablet (40 mg total) by mouth daily., Disp: 30 tablet, Rfl: 1 .  metoprolol tartrate (LOPRESSOR) 25 MG tablet, Take 1 tablet (25 mg total) by mouth 2 (two) times daily. Need appt to discuss BP., Disp: 60 tablet, Rfl: 2 .  ondansetron (ZOFRAN) 4 MG tablet, ondansetron HCl 4 mg tablet  TAKE 1 TABLET BY MOUTH EVERY 8 HOURS AS NEEDED FOR NAUSEA AND VOMITING, Disp: , Rfl:  .  oxyCODONE-acetaminophen (PERCOCET) 10-325 MG tablet, oxycodone-acetaminophen 10 mg-325 mg tablet  TAKE 1  TABLET BY MOUTH EVERY 4 HOURS AS NEEDED FOR FOOT PAIN, Disp: , Rfl:  .  spironolactone (ALDACTONE) 25 MG tablet, Take 1 tablet (25 mg total) by mouth daily. Need appt to discuss BP., Disp: 90 tablet, Rfl: 3 .  traMADol (ULTRAM) 50 MG tablet, tramadol 50 mg tablet  Take 1 tablet 4 times a day by oral route as needed., Disp: , Rfl:  .  Zoster Vaccine Adjuvanted [facility]) injection, Shingrix (PF) 50 mcg/0.5 mL intramuscular suspension, kit, Disp: , Rfl:   Social History   Tobacco Use  Smoking Status Never Smoker  Smokeless Tobacco Never Used  Tobacco Comment   No plans to start    Allergies  Allergen Reactions  . Penicillins Hives and Rash   Objective:   There were no vitals filed for this visit. There is no height or weight on file to calculate BMI. Constitutional Well developed. Well nourished.  Vascular Dorsalis pedis pulses palpable bilaterally. Posterior tibial pulses palpable bilaterally. Capillary refill normal to all digits.  No cyanosis or clubbing noted. Pedal hair growth normal.  Neurologic Normal speech. Oriented to person, place, and time. Epicritic sensation to light touch grossly present bilaterally.  Dermatologic Nails well groomed and normal in appearance. No open wounds. No skin lesions.  Orthopedic:  No pain to palpation  about the fifth metatarsal head slight pain palpation about the area of the former proximal phalangeal joint of the fifth toe.  Fifth toe appears rectus with the toenail facing rather dorsal   Radiographs: None Assessment:   No diagnosis found. Plan:  Patient was evaluated and treated and all questions answered.  Capsulitis bilateral feet with reactive keratoses -Lesions courtesy debrided for pain -Follow-up as needed  No follow-ups on file.

## 2019-10-11 ENCOUNTER — Other Ambulatory Visit: Payer: Self-pay

## 2019-10-11 ENCOUNTER — Other Ambulatory Visit: Payer: Medicare HMO | Admitting: Orthotics

## 2019-10-12 ENCOUNTER — Other Ambulatory Visit: Payer: Self-pay | Admitting: *Deleted

## 2019-10-13 MED ORDER — ATORVASTATIN CALCIUM 20 MG PO TABS: 20.0000 mg | ORAL_TABLET | Freq: Every day | ORAL | 2 refills | Status: DC

## 2019-10-13 MED ORDER — SPIRONOLACTONE 25 MG PO TABS: 25.0000 mg | ORAL_TABLET | Freq: Every day | ORAL | 3 refills | Status: DC

## 2019-10-13 MED ORDER — LISINOPRIL 40 MG PO TABS: 40.0000 mg | ORAL_TABLET | Freq: Every day | ORAL | 1 refills | Status: DC

## 2019-10-29 ENCOUNTER — Telehealth (INDEPENDENT_AMBULATORY_CARE_PROVIDER_SITE_OTHER): Payer: Medicare HMO | Admitting: Family Medicine

## 2019-10-29 ENCOUNTER — Other Ambulatory Visit: Payer: Self-pay

## 2019-10-29 ENCOUNTER — Ambulatory Visit: Payer: Medicare HMO | Attending: Internal Medicine

## 2019-10-29 DIAGNOSIS — Z20822 Contact with and (suspected) exposure to covid-19: Secondary | ICD-10-CM

## 2019-10-29 DIAGNOSIS — I10 Essential (primary) hypertension: Secondary | ICD-10-CM | POA: Diagnosis not present

## 2019-10-29 MED ORDER — METOPROLOL TARTRATE 25 MG PO TABS: 25.0000 mg | ORAL_TABLET | Freq: Two times a day (BID) | ORAL | 2 refills | Status: DC

## 2019-10-29 MED ORDER — AMLODIPINE BESYLATE 10 MG PO TABS: 10.0000 mg | ORAL_TABLET | Freq: Every day | ORAL | 11 refills | Status: DC

## 2019-10-29 NOTE — Progress Notes (Cosign Needed)
Virtual Visit via Video Note  I connected with Wilfred Lacy on 10/29/19 at  9:25 AM EST by a video enabled telemedicine application and verified that I am speaking with the correct person using two identifiers.  Location: Patient: Sarah [name]  Provider: Lattie Haw MD    I discussed the limitations of evaluation and management by telemedicine and the availability of in person appointments. The patient expressed understanding and agreed to proceed.  History of Present Illness:  Pt would like refills for her anti-hypertensives: Amlodipine and Metoprolol which she ran out of 2 days ago. Her last bp check was at [facility] on 10/09. Checks BP at home every other day. Last readings have been:159/70 and 159/7. Denies headaches, visual changes, chest pain, SOB, dizziness, edema. Denies side effects to medications. Exposure to person with COVID19 on 17/1/21. Is awaiting COVID test today. Reports eats fruit and low salt diet but thinks she could improve her diet. Leads sedentary lifestyle.  Observations/Objective: Comfortable  No acute abnormalties  Assessment and Plan: Recent BP readings per pt higher than goal. Recommended that pt come in for BP check if she tests negative for COVID and can consider increasing doses of current antihypertensives or changing/adding medications.  Follow Up Instructions: I discussed the assessment and treatment plan with the patient. The patient was provided an opportunity to ask questions and all were answered. The patient agreed with the plan and demonstrated an understanding of the instructions.   The patient was advised to call back or seek an in-person evaluation if the symptoms worsen or if the condition fails to improve as anticipated.  I provided 20 minutes of non-face-to-face time during this encounter.   Lattie Haw, MD

## 2019-10-30 LAB — NOVEL CORONAVIRUS, NAA: SARS-CoV-2, NAA: NOT DETECTED

## 2019-12-10 ENCOUNTER — Ambulatory Visit (INDEPENDENT_AMBULATORY_CARE_PROVIDER_SITE_OTHER): Payer: Medicare HMO | Admitting: Family Medicine

## 2019-12-10 ENCOUNTER — Other Ambulatory Visit: Payer: Self-pay

## 2019-12-10 ENCOUNTER — Encounter: Payer: Self-pay | Admitting: Family Medicine

## 2019-12-10 DIAGNOSIS — I10 Essential (primary) hypertension: Secondary | ICD-10-CM

## 2019-12-10 NOTE — Patient Instructions (Signed)
Hi Ms Mulcare,  It was lovely to meet you today. Your blood pressure is doing at target and you are doing great, keep up the good work!! Please try to reduce salt intake as this will help with blood pressure control. We spoke about managing your weight with diet control. Please see diet recommendations below.  Please follow up with me for a BP and weight check in 2-3 months time  Best wishes and take care,  Dr Jeanie Sewer (carb) foods: Bread, rice, pasta, potatoes, corn, cereal, grits, crackers, bagels, muffins, all baked goods.  (Fruits, milk, and yogurt also have carbohydrate, but most of these foods will not spike your blood sugar as most starchy foods will.)  A few fruits do cause high blood sugars; use small portions of bananas (limit to 1/2 at a time), grapes, watermelon, oranges, and most tropical fruits.    Protein foods: Meat, fish, poultry, eggs, dairy foods, and beans such as pinto and kidney beans (beans also provide carbohydrate).   1. Eat at least 3 meals and 1-2 snacks per day. Never go more than 4-5 hours while awake without eating. Eat breakfast within the first hour of getting up.   2. Limit starchy foods to TWO per meal and ONE per snack. ONE portion of a starchy  food is equal to the following:   - ONE slice of bread (or its equivalent, such as half of a hamburger bun).   - 1/2 cup of a "scoopable" starchy food such as potatoes or rice.   - 15 grams of Total Carbohydrate as shown on food label.  3. Include at every meal: a protein food, a carb food, and vegetables and/or fruit.   - Obtain twice the volume of veg's as protein or carbohydrate foods for both lunch and dinner.   - Fresh or frozen veg's are best.   - Keep frozen veg's on hand for a quick vegetable serving.

## 2019-12-12 NOTE — Assessment & Plan Note (Signed)
Blood pressure within normal limits today and at goal.  We will continue amlodipine, lisinopril, metoprolol and spironolactone.  Follow-up in 3 months time.

## 2019-12-12 NOTE — Progress Notes (Signed)
    SUBJECTIVE:   CHIEF COMPLAINT / HPI:   Sarah [name] 69 year old female who presents today for a blood pressure follow-up.  Hypertension BP today 142/78. Currently takes amlodipine, lisinopril, metoprolol and spironolactone.  Blood pressures at home are usually between the Q000111Q and Q000111Q systolic.  Denies side effects and is tolerating medications well.  Denies chest pain, shortness of breath, dizziness or palpitations.  Has peripheral edema due to polio as a young child.  PERTINENT  PMH / PSH: Hypertension, hyperlipidemia  OBJECTIVE:   BP (!) 142/78   Pulse 68   Wt 203 lb (92.1 kg)   SpO2 98%   BMI 33.78 kg/m    General: Alert and cooperative and appears to be in no acute distress Cardio: Normal S1 and S2, RRR   Pulm: CTAB, normal work of breathing Abdomen: Bowel sounds normal. Abdomen soft and non-tender.  Extremeties: Mild bilateral edema of ankles  ASSESSMENT/PLAN:   HYPERTENSION, BENIGN SYSTEMIC Blood pressure within normal limits today and at goal.  We will continue amlodipine, lisinopril, metoprolol and spironolactone.  Follow-up in 3 months time.   Lattie Haw, MD [place]

## 2019-12-17 ENCOUNTER — Other Ambulatory Visit: Payer: Self-pay

## 2019-12-17 ENCOUNTER — Ambulatory Visit: Payer: Medicare HMO | Admitting: Podiatry

## 2019-12-17 ENCOUNTER — Other Ambulatory Visit: Payer: Medicare HMO | Admitting: Orthotics

## 2019-12-17 VITALS — Temp 97.3°F

## 2019-12-17 DIAGNOSIS — M21962 Unspecified acquired deformity of left lower leg: Secondary | ICD-10-CM

## 2019-12-17 DIAGNOSIS — M7752 Other enthesopathy of left foot: Secondary | ICD-10-CM

## 2019-12-29 ENCOUNTER — Telehealth: Payer: Self-pay | Admitting: Family Medicine

## 2019-12-29 NOTE — Telephone Encounter (Signed)
Handicapped placard form dropped off for at front desk for completion.  Verified that patient section of form has been completed.  Last DOS/WCC with PCP was 12-10-19.  Placed form in [place] team folder to be completed by clinical staff.  Richrd Humbles

## 2019-12-30 ENCOUNTER — Telehealth: Payer: Self-pay | Admitting: Podiatry

## 2019-12-30 NOTE — Telephone Encounter (Signed)
Clinical info completed on Handicap [place] form.  Place form in Dr. Daneil Dan box for completion.  Ottis Stain, CMA

## 2019-12-30 NOTE — Telephone Encounter (Signed)
Called pt after discussing with insurance company and Liliane Channel to let her know the shoes are not covered by her insurance and the cost is 600.00.She asked if we did payment plans and I told her we did but would need 300.00 down and then we can order the shoes. She is going to come in to make the payment and I told her to make sure the front staff lets me know so I can get the shoes ordered.

## 2019-12-31 NOTE — Telephone Encounter (Signed)
Patient called and informed that form is ready to be picked up. Copy made and placed in batch scanning. Original placed in files at front desk.   Talbot Grumbling, RN

## 2020-01-19 ENCOUNTER — Ambulatory Visit
Admission: RE | Admit: 2020-01-19 | Discharge: 2020-01-19 | Disposition: A | Discharge status: Home / Self Care | Payer: Medicare HMO | Source: Ambulatory Visit | Attending: Family Medicine | Admitting: Family Medicine

## 2020-01-19 ENCOUNTER — Ambulatory Visit (INDEPENDENT_AMBULATORY_CARE_PROVIDER_SITE_OTHER): Payer: Medicare HMO | Admitting: Family Medicine

## 2020-01-19 ENCOUNTER — Other Ambulatory Visit: Payer: Self-pay

## 2020-01-19 VITALS — BP 126/70 | HR 72 | Ht 64.37 in | Wt 200.8 lb

## 2020-01-19 DIAGNOSIS — M7989 Other specified soft tissue disorders: Secondary | ICD-10-CM | POA: Diagnosis not present

## 2020-01-19 DIAGNOSIS — M79645 Pain in left finger(s): Secondary | ICD-10-CM | POA: Diagnosis not present

## 2020-01-19 NOTE — Progress Notes (Signed)
    SUBJECTIVE:   CHIEF COMPLAINT / HPI:   Left thumb pain and swelling Patient reports that over the last few days, she has had gradual development of swelling and pain in the MCP joint of her left thumb.  She has never experienced this type of swelling before in this joint or any other joints.  She does say that she knits and crochets very frequently.  She has been diagnosed with arthritis in other joints before.  She says that she has overall been feeling well.  She denies any redness of the joint or pain to light touch.  She denies any injury or break in the skin of that finger.  She has not tried anything to alleviate the pain.  She has not noticed any numbness, tingling, or weakness of the left hand.  PERTINENT  PMH / PSH: Hypertension, osteoarthritis, hyperlipidemia, joint pain, prediabetes  OBJECTIVE:   BP 126/70   Pulse 72   Ht 5' 4.37" (1.635 m)   Wt 200 lb 12.8 oz (91.1 kg)   SpO2 99%   BMI 34.07 kg/m   General: well appearing, appears stated age Left hand: Thenar eminence appears mildly swollen but is not erythematous.  There are no breaks in the skin, bruising, or other signs of trauma.  Patient is significantly tender to palpation of the MCP joint of the thumb, and she has pain with flexion and extension of the thumb, although ROM is within normal limits.  Light palpation does not elicit pain.  Strength is 4/5 on finger abduction, and patient is unable to complete opposition due to pain and swelling.  Sensation is intact and symmetric bilaterally.  Neurovascularly intact.   PIP and DIP joints of bilateral hands appear chronically swollen but are nonerythematous and nontender.   ASSESSMENT/PLAN:   Swelling of left thumb Differential includes flare of osteoarthritis, gout, and joint infection.  Most likely diagnosis is a flare of osteoarthritis given patient's repetitive use of her hands while knitting and crocheting and gradual onset of pain.  Reassuring that patient does not  have a fever, has no history of diabetes, and feels well overall.  However, we will obtain an x-ray of the left thumb as well as a CBC with differential, ESR, and uric acid level to further investigate the cause of patient's pain.  If labs are reassuring and it appears that osteoarthritis is the most likely cause, will refer patient to sports medicine or hand surgery for corticosteroid injection.  Patient was also advised that she can use Tylenol and ibuprofen to alleviate her pain until that time.  Patient was given return precautions and informed of the signs of infection of the joint.     Kathrene Alu, MD [place]

## 2020-01-19 NOTE — Patient Instructions (Signed)
It was nice meeting you today Sarah [name]!  I have ordered an x-ray and several blood tests today to try to determine why you are having the swelling and pain in your thumb.  I will be in touch with these results once they return.  In the meantime, you can take ibuprofen and Tylenol for your pain and swelling.  If arthritis is determined to be the cause, we can send a referral for you to get an injection in this joint if you would like.  If you begin to feel bad or have increased swelling and pain and redness, please let us know or go to urgent care.  If you have any questions or concerns, please feel free to call the clinic.   Be well,  Dr. Shan Levans

## 2020-01-19 NOTE — Assessment & Plan Note (Addendum)
Differential includes flare of osteoarthritis, gout, and joint infection.  Most likely diagnosis is a flare of osteoarthritis given patient's repetitive use of her hands while knitting and crocheting and gradual onset of pain.  Reassuring that patient does not have a fever, has no history of diabetes, and feels well overall.  However, we will obtain an x-ray of the left thumb as well as a CBC with differential, ESR, and uric acid level to further investigate the cause of patient's pain.  If labs are reassuring and it appears that osteoarthritis is the most likely cause, will refer patient to sports medicine or hand surgery for corticosteroid injection.  Patient was also advised that she can use Tylenol and ibuprofen to alleviate her pain until that time.  Patient was given return precautions and informed of the signs of infection of the joint.

## 2020-01-20 ENCOUNTER — Other Ambulatory Visit: Payer: Self-pay | Admitting: Family Medicine

## 2020-01-20 DIAGNOSIS — M7989 Other specified soft tissue disorders: Secondary | ICD-10-CM

## 2020-01-20 LAB — URIC ACID: Uric Acid: 6.5 mg/dL (ref 3.0–7.2)

## 2020-01-20 LAB — CBC WITH DIFFERENTIAL/PLATELET
Basophils Absolute: 0.1 10*3/uL (ref 0.0–0.2)
Basos: 1 %
EOS (ABSOLUTE): 0.3 10*3/uL (ref 0.0–0.4)
Eos: 7 %
Hematocrit: 39.2 % (ref 34.0–46.6)
Hemoglobin: 13.2 g/dL (ref 11.1–15.9)
Immature Grans (Abs): 0 10*3/uL (ref 0.0–0.1)
Immature Granulocytes: 0 %
Lymphocytes Absolute: 2.1 10*3/uL (ref 0.7–3.1)
Lymphs: 46 %
MCH: 31.7 pg (ref 26.6–33.0)
MCHC: 33.7 g/dL (ref 31.5–35.7)
MCV: 94 fL (ref 79–97)
Monocytes Absolute: 0.4 10*3/uL (ref 0.1–0.9)
Monocytes: 8 %
Neutrophils Absolute: 1.7 10*3/uL (ref 1.4–7.0)
Neutrophils: 38 %
Platelets: 207 10*3/uL (ref 150–450)
RBC: 4.17 x10E6/uL (ref 3.77–5.28)
RDW: 12.3 % (ref 11.7–15.4)
WBC: 4.6 10*3/uL (ref 3.4–10.8)

## 2020-01-20 LAB — SEDIMENTATION RATE: Sed Rate: 36 mm/hr (ref 0–40)

## 2020-01-28 ENCOUNTER — Other Ambulatory Visit: Payer: Self-pay | Admitting: *Deleted

## 2020-01-28 MED ORDER — AMLODIPINE BESYLATE 10 MG PO TABS: 10.0000 mg | ORAL_TABLET | Freq: Every day | ORAL | 11 refills | Status: DC

## 2020-01-28 MED ORDER — METOPROLOL TARTRATE 25 MG PO TABS: 25.0000 mg | ORAL_TABLET | Freq: Two times a day (BID) | ORAL | 2 refills | Status: DC

## 2020-01-28 NOTE — Telephone Encounter (Signed)
Pt would like these medications to be sent to [facility], Pt shows refills on amlodipine at the [facility] but she wants them to be sent to [facility]. Shihab States Zimmerman Rumple, CMA

## 2020-02-09 ENCOUNTER — Other Ambulatory Visit: Payer: Self-pay

## 2020-02-09 NOTE — Telephone Encounter (Signed)
Received fax from [facility] asking [place] to send Rx to them. Called pt, verified that she would like medications to be sent to [facility]. Ottis Stain, CMA

## 2020-02-10 MED ORDER — METOPROLOL TARTRATE 25 MG PO TABS: 25.0000 mg | ORAL_TABLET | Freq: Two times a day (BID) | ORAL | 2 refills | Status: DC

## 2020-02-10 MED ORDER — AMLODIPINE BESYLATE 10 MG PO TABS: 10.0000 mg | ORAL_TABLET | Freq: Every day | ORAL | 11 refills | Status: DC

## 2020-02-25 ENCOUNTER — Other Ambulatory Visit: Payer: Self-pay | Admitting: Family Medicine

## 2020-02-25 DIAGNOSIS — Z1231 Encounter for screening mammogram for malignant neoplasm of breast: Secondary | ICD-10-CM

## 2020-03-31 ENCOUNTER — Ambulatory Visit
Admission: RE | Admit: 2020-03-31 | Discharge: 2020-03-31 | Disposition: A | Discharge status: Home / Self Care | Payer: Medicare HMO | Source: Ambulatory Visit | Attending: Family Medicine | Admitting: Family Medicine

## 2020-03-31 ENCOUNTER — Other Ambulatory Visit: Payer: Self-pay

## 2020-03-31 DIAGNOSIS — Z1231 Encounter for screening mammogram for malignant neoplasm of breast: Secondary | ICD-10-CM | POA: Diagnosis not present

## 2020-04-16 NOTE — Progress Notes (Signed)
Subjective:  Patient ID: Sarah [name], female    DOB: [birth year],  MRN: [id number]  Chief Complaint  Patient presents with  . Follow-up    Bilateral plantar foot pain. Pt stated, "It's the same pain (intermittent, sharp) that I had before. I have an appt for new orthotics/shoes today".    69 y.o. female presents with the above complaint.  History above confirmed with patient.   Review of Systems: Negative except as noted in the HPI. Denies N/V/F/Ch.  Past Medical History:  Diagnosis Date  . Arthritis   . Hyperlipidemia   . Hypertension   . Polio 1964    Current Outpatient Medications:  .  aspirin 81 MG EC tablet, Take 81 mg by mouth daily.  , Disp: , Rfl:  .  atorvastatin (LIPITOR) 20 MG tablet, Take 1 tablet (20 mg total) by mouth daily., Disp: 30 tablet, Rfl: 2 .  Calcium Carbonate-Vitamin D (CALTRATE 600+D) 600-400 MG-UNIT per tablet, Take 1 tablet by mouth 2 (two) times daily.  , Disp: , Rfl:  .  Cholecalciferol 125 MCG (5000 UT) TABS, Vitamin D3 125 mcg (5,000 unit) tablet  Take 3 tablets every day by oral route as directed for 30 days., Disp: , Rfl:  .  ibuprofen (ADVIL) 800 MG tablet, ibuprofen 800 mg tablet  TAKE 1 TABLET BY MOUTH EVERY 8 HOURS AS NEEDED, Disp: , Rfl:  .  lisinopril (ZESTRIL) 40 MG tablet, Take 1 tablet (40 mg total) by mouth daily., Disp: 30 tablet, Rfl: 1 .  ondansetron (ZOFRAN) 4 MG tablet, ondansetron HCl 4 mg tablet  TAKE 1 TABLET BY MOUTH EVERY 8 HOURS AS NEEDED FOR NAUSEA AND VOMITING, Disp: , Rfl:  .  oxyCODONE-acetaminophen (PERCOCET) 10-325 MG tablet, oxycodone-acetaminophen 10 mg-325 mg tablet  TAKE 1 TABLET BY MOUTH EVERY 4 HOURS AS NEEDED FOR FOOT PAIN, Disp: , Rfl:  .  spironolactone (ALDACTONE) 25 MG tablet, Take 1 tablet (25 mg total) by mouth daily. Need appt to discuss BP., Disp: 90 tablet, Rfl: 3 .  traMADol (ULTRAM) 50 MG tablet, tramadol 50 mg tablet  Take 1 tablet 4 times a day by oral route as needed., Disp: , Rfl:  .  Zoster Vaccine  Adjuvanted (SHINGRIX) injection, Shingrix (PF) 50 mcg/0.5 mL intramuscular suspension, kit, Disp: , Rfl:  .  amLODipine (NORVASC) 10 MG tablet, Take 1 tablet (10 mg total) by mouth daily., Disp: 30 tablet, Rfl: 11 .  clindamycin (CLEOCIN) 300 MG capsule, Take 1 capsule (300 mg total) by mouth 2 (two) times daily. (Patient not taking: Reported on 12/17/2019), Disp: 14 capsule, Rfl: 0 .  metoprolol tartrate (LOPRESSOR) 25 MG tablet, Take 1 tablet (25 mg total) by mouth 2 (two) times daily. Need appt to discuss BP., Disp: 60 tablet, Rfl: 2  Social History   Tobacco Use  Smoking Status Never Smoker  Smokeless Tobacco Never Used  Tobacco Comment   No plans to start    Allergies  Allergen Reactions  . Penicillins Hives and Rash   Objective:   Vitals:   12/17/19 1028  Temp: (!) 97.3 F (36.3 C)   There is no height or weight on file to calculate BMI. Constitutional Well developed. Well nourished.  Vascular Dorsalis pedis pulses palpable bilaterally. Posterior tibial pulses palpable bilaterally. Capillary refill normal to all digits.  No cyanosis or clubbing noted. Pedal hair growth normal.  Neurologic Normal speech. Oriented to person, place, and time. Epicritic sensation to light touch grossly present bilaterally.  Dermatologic Nails well  groomed and normal in appearance. No open wounds. No skin lesions.  Orthopedic:  No pain to palpation about the fifth metatarsal head slight pain palpation about the area of the former proximal phalangeal joint of the fifth toe.  Fifth toe appears rectus with the toenail facing rather dorsal   Radiographs: None Assessment:   1. Capsulitis of metatarsophalangeal (MTP) joint of left foot   2. Deformity of metatarsal bone of left foot    Plan:  Patient was evaluated and treated and all questions answered.  Capsulitis bilateral feet with reactive keratoses -Lesions  courtesy debrided -Patient casted for new shoes  Onychomycosis -Nails  debrided x10 No follow-ups on file.

## 2020-04-25 ENCOUNTER — Other Ambulatory Visit: Payer: Self-pay

## 2020-04-25 ENCOUNTER — Ambulatory Visit (INDEPENDENT_AMBULATORY_CARE_PROVIDER_SITE_OTHER): Payer: Medicare HMO

## 2020-04-25 VITALS — BP 150/80 | HR 93 | Ht 64.1 in | Wt 205.0 lb

## 2020-04-25 DIAGNOSIS — Z0000 Encounter for general adult medical examination without abnormal findings: Secondary | ICD-10-CM | POA: Diagnosis not present

## 2020-04-25 NOTE — Progress Notes (Addendum)
Subjective:   Sarah [name] is a 69 y.o. female who presents for Medicare Annual (Subsequent) preventive examination.  Review of Systems: Defer to PCP.  Cardiac Risk Factors include: advanced age (>74mn, >>80women);hypertension;obesity (BMI >30kg/m2)  Objective:   Vitals: BP (!) 150/80   Pulse 93   Ht 5' 4.1" (1.628 m)   Wt 205 lb (93 kg)   BMI 35.08 kg/m   Body mass index is 35.08 kg/m.  Advanced Directives 04/25/2020 12/10/2019 07/16/2019 07/28/2018 04/16/2018 02/25/2018 02/19/2018  Does Patient Have a Medical Advance Directive? _0  No No  Would patient like information on creating a medical advance directive? Yes (MAU/Ambulatory/Procedural Areas - Information given) Yes (MAU/Ambulatory/Procedural Areas - Information given) No - Patient declined Yes (MAU/Ambulatory/Procedural Areas - Information given) No - Patient declined No - Patient declined No - Patient declined   Tobacco Social History   Tobacco Use  Smoking Status Never Smoker  Smokeless Tobacco Never Used  Tobacco Comment   No plans to start     Clinical Intake:  Pre-visit preparation completed: Yes  Pain Score: 0-No pain  How often do you need to have someone help you when you read instructions, pamphlets, or other written materials from your doctor or pharmacy?: 2 - Rarely What is the last grade level you completed in school?: high school  Interpreter Needed?: No  Past Medical History:  Diagnosis Date  . Arthritis   . Hyperlipidemia   . Hypertension   . Polio 1964   Past Surgical History:  Procedure Laterality Date  . FOOT SURGERY Left 1961   due to polio  . HYSTEROSCOPY WITH D & C N/A 02/25/2018   Procedure: DILATATION AND CURETTAGE /HYSTEROSCOPY;  Surgeon: EChancy Milroy MD;  [facility]  Service: Gynecology;  Laterality: N/A;  . TONSILLECTOMY AND ADENOIDECTOMY     Family History  Problem Relation Age of Onset  . Hypertension Mother   . Stroke Mother   .  Cancer Father 465      Unknown  . Stroke Sister        multiple  . Hypertension Sister   . Kidney disease Sister   . Diabetes Brother   . Hypertension Sister   . Hypertension Brother   . Kidney disease Son   . Diabetes Son   . Hypertension Daughter    Social History   Socioeconomic History  . Marital status: Widowed    Spouse name: Not on file  . Number of children: 6  . Years of education: 167 . Highest education level: 12th grade  Occupational History  . Occupation: Child Care   Tobacco Use  . Smoking status: Never Smoker  . Smokeless tobacco: Never Used  . Tobacco comment: No plans to start  Vaping Use  . Vaping Use: Never used  Substance and Sexual Activity  . Alcohol use: No  . Drug use: No  . Sexual activity: Not Currently  Other Topics Concern  . Not on file  Social History Narrative   [place]    Patient has 3 birthed children and 3 adopted.   Patient is a widow.   Patient has a lot of stress at home. 1 son in jail and 1 daughter who runs away and hangs with "wrong crowd."   Patient enjoys crocheting, sewing, gardening, puzzles, and playing games with her family.  Social Determinants of Health   Financial Resource Strain: Low Risk   . Difficulty of Paying Living Expenses: Not hard at all  Food Insecurity: No Food Insecurity  . Worried About Charity fundraiser in the Last Year: Never true  . Ran Out of Food in the Last Year: Never true  Transportation Needs: Unmet Transportation Needs  . Lack of Transportation (Medical): Yes  . Lack of Transportation (Non-Medical): Yes  Physical Activity: Inactive  . Days of Exercise per Week: 0 days  . Minutes of Exercise per Session: 0 min  Stress: Stress Concern Present  . Feeling of Stress : Rather much  Social Connections: Moderately Integrated  .  Frequency of Communication with Friends and Family: More than three times a week  . Frequency of Social Gatherings with Friends and Family: More than three times a week  . Attends Religious Services: More than 4 times per year  . Active Member of Clubs or Organizations: No  . Attends Archivist Meetings: Never  . Marital Status: Married   Outpatient Encounter Medications as of 04/25/2020  Medication Sig  . amLODipine (NORVASC) 10 MG tablet Take 1 tablet (10 mg total) by mouth daily.  Marland Kitchen aspirin 81 MG EC tablet Take 81 mg by mouth daily.    Marland Kitchen atorvastatin (LIPITOR) 20 MG tablet Take 1 tablet (20 mg total) by mouth daily.  . Calcium Carbonate-Vitamin D (CALTRATE 600+D) 600-400 MG-UNIT per tablet Take 1 tablet by mouth 2 (two) times daily.    . Cholecalciferol 125 MCG (5000 UT) TABS Vitamin D3 125 mcg (5,000 unit) tablet  Take 3 tablets every day by oral route as directed for 30 days.  Marland Kitchen lisinopril (ZESTRIL) 40 MG tablet Take 1 tablet (40 mg total) by mouth daily.  . metoprolol tartrate (LOPRESSOR) 25 MG tablet Take 1 tablet (25 mg total) by mouth 2 (two) times daily. Need appt to discuss BP.  Marland Kitchen spironolactone (ALDACTONE) 25 MG tablet Take 1 tablet (25 mg total) by mouth daily. Need appt to discuss BP.  . traMADol (ULTRAM) 50 MG tablet tramadol 50 mg tablet  Take 1 tablet 4 times a day by oral route as needed.  . clindamycin (CLEOCIN) 300 MG capsule Take 1 capsule (300 mg total) by mouth 2 (two) times daily. (Patient not taking: Reported on 12/17/2019)  . ibuprofen (ADVIL) 800 MG tablet ibuprofen 800 mg tablet  TAKE 1 TABLET BY MOUTH EVERY 8 HOURS AS NEEDED (Patient not taking: Reported on 04/25/2020)  . ondansetron (ZOFRAN) 4 MG tablet ondansetron HCl 4 mg tablet  TAKE 1 TABLET BY MOUTH EVERY 8 HOURS AS NEEDED FOR NAUSEA AND VOMITING (Patient not taking: Reported on 04/25/2020)  . oxyCODONE-acetaminophen (PERCOCET) 10-325 MG tablet oxycodone-acetaminophen 10 mg-325 mg tablet  TAKE 1  TABLET BY MOUTH EVERY 4 HOURS AS NEEDED FOR FOOT PAIN (Patient not taking: Reported on 04/25/2020)  . Zoster Vaccine Adjuvanted [facility]) injection Shingrix (PF) 50 mcg/0.5 mL intramuscular suspension, kit   No facility-administered encounter medications on file as of 04/25/2020.   Activities of Daily Living In your present state of health, do you have any difficulty performing the following activities: 04/25/2020  Hearing? N  Vision? N  Difficulty concentrating or making decisions? N  Walking or climbing stairs? Y  Dressing or bathing? N  Doing errands, shopping? N  Preparing Food and eating ? N  Using the Toilet? N  In the past six months, have you accidently leaked urine? N  Do you have problems  with loss of bowel control? N  Managing your Medications? N  Managing your Finances? N  Housekeeping or managing your Housekeeping? N  Some recent data might be hidden   Patient Care Team: Lattie Haw, MD as PCP - General Evelina Bucy, DPM as Consulting Physician (Podiatry)    Assessment:   This is a routine wellness examination for Yanette.  Exercise Activities and Dietary recommendations Current Exercise Habits: The patient does not participate in regular exercise at present, Exercise limited by: orthopedic condition(s)  Goals    . Blood Pressure < 140/90    . Increase physical activity    . Weight < 200 lb (90.7 kg)      Fall Risk Fall Risk  04/25/2020 01/19/2020 12/10/2019 07/16/2019 07/09/2019  Falls in the past year? 0 0 0 0 0  Number falls in past yr: - - 0 - 0  Injury with Fall? - - 0 - -  Comment - - - - -  Risk Factor Category  - - - - -  Comment - - - - -  Risk for fall due to : - - - - -  Follow up - - Falls evaluation completed;Education provided - Falls evaluation completed   Is the patient's home free of loose throw rugs in walkways, pet beds, electrical cords, etc?   yes      Grab bars in the bathroom? yes      Handrails on the stairs?   yes      Adequate  lighting?   yes  Patient rating of health (0-10) scale:  9  Depression Screen PHQ 2/9 Scores 04/25/2020 01/19/2020 12/10/2019 07/16/2019  PHQ - 2 Score 2 0 0 0  PHQ- 9 Score 3 - - -  Exception Documentation - - - -    Cognitive Function MMSE - Mini Mental State Exam 07/28/2018  Orientation to time 5  Orientation to Place 5  Registration 3  Attention/ Calculation 5  Recall 3  Language- name 2 objects 2  Language- repeat 1  Language- follow 3 step command 3  Language- read & follow direction 1  Write a sentence 1  Copy design 1  Total score 30   6CIT Screen 04/25/2020 07/28/2018  What Year? 0 points 0 points  What month? 0 points 0 points  What time? 0 points 0 points  Count back from 20 0 points 0 points  Months in reverse 0 points 0 points  Repeat phrase 0 points 0 points  Total Score 0 0   Immunization History  Administered Date(s) Administered  . Influenza Whole 07/27/2007  . PFIZER SARS-COV-2 Vaccination 12/02/2019, 12/30/2019  . Pneumococcal Conjugate-13 04/16/2018  . Td 02/04/1990  . Tdap 01/31/2016  . Zoster 01/31/2016  . Zoster Recombinat (Shingrix) 05/21/2017   Screening Tests Health Maintenance  Topic Date Due  . PNA vac Low Risk Adult (2 of 2 - PPSV23) 04/17/2019  . INFLUENZA VACCINE  05/07/2020  . MAMMOGRAM  03/31/2022  . COLONOSCOPY  06/08/2023  . TETANUS/TDAP  01/30/2026  . DEXA SCAN  Completed  . COVID-19 Vaccine  Completed  . Hepatitis C Screening  Completed   Cancer Screenings: Lung: Low Dose CT Chest recommended if Age 23-80 years, 30 pack-year currently smoking OR have quit w/in 15years. Patient does not qualify. Breast:  Up to date on Mammogram? Yes   Up to date of Bone Density/Dexa? Yes Colorectal: UTD  Additional Screenings: Hepatitis C Screening: Completed   Plan:  I have updated your  chart to reflect covid vaccine completion.  PCP apt scheduled for 8/26 to discuss blood pressure. Start light walking and using light dumbbells for arm  strengthening.  Fill out an advance directive.  I have personally reviewed and noted the following in the patient's chart:   . Medical and social history . Use of alcohol, tobacco or illicit drugs  . Current medications and supplements . Functional ability and status . Nutritional status . Physical activity . Advanced directives . List of other physicians . Hospitalizations, surgeries, and ER visits in previous 12 months . Vitals . Screenings to include cognitive, depression, and falls . Referrals and appointments  In addition, I have reviewed and discussed with patient certain preventive protocols, quality metrics, and best practice recommendations. A written personalized care plan for preventive services as well as general preventive health recommendations were provided to patient.  Dorna Bloom, [place]  04/25/2020   I have reviewed this visit and agree with the documentation.   Lattie Haw MD  PGY-2, [facility]

## 2020-04-25 NOTE — Patient Instructions (Signed)
You spoke to Dorna Bloom, CMA in office for your annual wellness visit.  We discussed goals: Goals    . Blood Pressure < 140/90    . Increase physical activity    . Weight < 200 lb (90.7 kg)      We also discussed recommended health maintenance. Please call our office and schedule a visit. As discussed, you are due for PNA 23 Vaccine. You can get this at your scheduled PCP visit for 8/26.  Health Maintenance  Topic Date Due  . PNA vac Low Risk Adult (2 of 2 - PPSV23) 04/17/2019  . INFLUENZA VACCINE  05/07/2020  . MAMMOGRAM  03/31/2022  . COLONOSCOPY  06/08/2023  . TETANUS/TDAP  01/30/2026  . DEXA SCAN  Completed  . COVID-19 Vaccine  Completed  . Hepatitis C Screening  Completed   I have updated your chart to reflect covid vaccine completion.  PCP apt scheduled for 8/26 to discuss blood pressure. Start light walking and using light dumbbells for arm strengthening.  Fill out an advance directive.  Preventive Care 67 Years and Older, Female Preventive care refers to lifestyle choices and visits with your health care provider that can promote health and wellness. This includes:  A yearly physical exam. This is also called an annual well check.  Regular dental and eye exams.  Immunizations.  Screening for certain conditions.  Healthy lifestyle choices, such as diet and exercise. What can I expect for my preventive care visit? Physical exam Your health care provider will check:  Height and weight. These may be used to calculate body mass index (BMI), which is a measurement that tells if you are at a healthy weight.  Heart rate and blood pressure.  Your skin for abnormal spots. Counseling Your health care provider may ask you questions about:  Alcohol, tobacco, and drug use.  Emotional well-being.  Home and relationship well-being.  Sexual activity.  Eating habits.  History of falls.  Memory and ability to understand (cognition).  Work and work  Statistician.  Pregnancy and menstrual history. What immunizations do I need?  Influenza (flu) vaccine  This is recommended every year. Tetanus, diphtheria, and pertussis (Tdap) vaccine  You may need a Td booster every 10 years. Varicella (chickenpox) vaccine  You may need this vaccine if you have not already been vaccinated. Zoster (shingles) vaccine  You may need this after age 33. Pneumococcal conjugate (PCV13) vaccine  One dose is recommended after age 67. Pneumococcal polysaccharide (PPSV23) vaccine  One dose is recommended after age 67. Measles, mumps, and rubella (MMR) vaccine  You may need at least one dose of MMR if you were born in 1957 or later. You may also need a second dose. Meningococcal conjugate (MenACWY) vaccine  You may need this if you have certain conditions. Hepatitis A vaccine  You may need this if you have certain conditions or if you travel or work in places where you may be exposed to hepatitis A. Hepatitis B vaccine  You may need this if you have certain conditions or if you travel or work in places where you may be exposed to hepatitis B. Haemophilus influenzae type b (Hib) vaccine  You may need this if you have certain conditions. You may receive vaccines as individual doses or as more than one vaccine together in one shot (combination vaccines). Talk with your health care provider about the risks and benefits of combination vaccines. What tests do I need? Blood tests  Lipid and cholesterol levels. These  may be checked every 5 years, or more frequently depending on your overall health.  Hepatitis C test.  Hepatitis B test. Screening  Lung cancer screening. You may have this screening every year starting at age 67 if you have a 30-pack-year history of smoking and currently smoke or have quit within the past 15 years.  Colorectal cancer screening. All adults should have this screening starting at age 67 and continuing until age 76. Your  health care provider may recommend screening at age 67 if you are at increased risk. You will have tests every 1-10 years, depending on your results and the type of screening test.  Diabetes screening. This is done by checking your blood sugar (glucose) after you have not eaten for a while (fasting). You may have this done every 1-3 years.  Mammogram. This may be done every 1-2 years. Talk with your health care provider about how often you should have regular mammograms.  BRCA-related cancer screening. This may be done if you have a family history of breast, ovarian, tubal, or peritoneal cancers. Other tests  Sexually transmitted disease (STD) testing.  Bone density scan. This is done to screen for osteoporosis. You may have this done starting at age 67 Follow these instructions at home: Eating and drinking  Eat a diet that includes fresh fruits and vegetables, whole grains, lean protein, and low-fat dairy products. Limit your intake of foods with high amounts of sugar, saturated fats, and salt.  Take vitamin and mineral supplements as recommended by your health care provider.  Do not drink alcohol if your health care provider tells you not to drink.  If you drink alcohol: ? Limit how much you have to 0-1 drink a day. ? Be aware of how much alcohol is in your drink. In the [place] one drink equals one 12 oz bottle of beer (355 mL), one 5 oz glass of wine (148 mL), or one 1 oz glass of hard liquor (44 mL). Lifestyle  Take daily care of your teeth and gums.  Stay active. Exercise for at least 30 minutes on 5 or more days each week.  Do not use any products that contain nicotine or tobacco, such as cigarettes, e-cigarettes, and chewing tobacco. If you need help quitting, ask your health care provider.  If you are sexually active, practice safe sex. Use a condom or other form of protection in order to prevent STIs (sexually transmitted infections).  Talk with your health care provider  about taking a low-dose aspirin or statin. What's next?  Go to your health care provider once a year for a well check visit.  Ask your health care provider how often you should have your eyes and teeth checked.  Stay up to date on all vaccines. This information is not intended to replace advice given to you by your health care provider. Make sure you discuss any questions you have with your health care provider. Document Revised: 09/17/2018 Document Reviewed: 09/17/2018 Elsevier Patient Education  2020 [facility]'s number is [phone]. Please call with questions or concerns about what we discussed today.

## 2020-06-01 ENCOUNTER — Ambulatory Visit (INDEPENDENT_AMBULATORY_CARE_PROVIDER_SITE_OTHER): Payer: Medicare HMO | Admitting: Family Medicine

## 2020-06-01 ENCOUNTER — Encounter: Payer: Self-pay | Admitting: Family Medicine

## 2020-06-01 ENCOUNTER — Other Ambulatory Visit: Payer: Self-pay

## 2020-06-01 VITALS — BP 132/80 | HR 78 | Ht 64.0 in | Wt 206.4 lb

## 2020-06-01 DIAGNOSIS — E785 Hyperlipidemia, unspecified: Secondary | ICD-10-CM

## 2020-06-01 DIAGNOSIS — I10 Essential (primary) hypertension: Secondary | ICD-10-CM | POA: Diagnosis not present

## 2020-06-01 DIAGNOSIS — E1169 Type 2 diabetes mellitus with other specified complication: Secondary | ICD-10-CM

## 2020-06-01 DIAGNOSIS — R7303 Prediabetes: Secondary | ICD-10-CM | POA: Diagnosis not present

## 2020-06-01 LAB — POCT GLYCOSYLATED HEMOGLOBIN (HGB A1C): HbA1c, POC (controlled diabetic range): 5.8 % (ref 0.0–7.0)

## 2020-06-01 MED ORDER — LISINOPRIL 40 MG PO TABS: 40.0000 mg | ORAL_TABLET | Freq: Every day | ORAL | 1 refills | Status: DC

## 2020-06-01 NOTE — Assessment & Plan Note (Addendum)
A1c 5.8 and patient is still prediabetic range. Congratulated patient on this result. Provided patient with diet and lifestyle modifications. Will recheck A1c again later this year in 3 to 6 months.  Did discuss the possibility of starting Metformin if she does develop diabetes in the future.

## 2020-06-01 NOTE — Assessment & Plan Note (Addendum)
Tolerating atorvastatin 20 mg well.  Lipid panel obtained today. Could consider increasing atorvastatin dose if abnormal cholesterol panel.  We will contact patient with results.

## 2020-06-01 NOTE — Patient Instructions (Addendum)
Sarah [name],  It was great to see you today. Your blood test shows you do not have diabetes but pre-diabetes. Please continue to work on your diet and exercise a little more. This will help with reducing the risk of you developing diabetes. We are getting a cholesterol blood test. I will be in touch if anything is abnormal. Please come back in 3 months for a repeat diabetes blood test.   Your BP looks great today. Please come in for a nurse BP check next week and we can adjust your medications if your BP is normal and within goal.  Please do not hesitate to contact me if you have further questions.   Best wishes,  Dr Posey Pronto

## 2020-06-01 NOTE — Assessment & Plan Note (Addendum)
BP 132/80 and at goal.  Considered stopping 1 of patient's antihypertensives however recommended  patient comes in for nursing visit next week to check blood pressure again and if blood pressure under 140/90 can stop amlodipine.  Continue amlodipine, lisinopril spironolactone and metoprolol at current doses for now.

## 2020-06-01 NOTE — Progress Notes (Signed)
    SUBJECTIVE:   CHIEF COMPLAINT / HPI:   Sarah [name] is a 69 yr old female who presents today for diabetes follow up  Prediabetes  Denies polyuria, polydipsia or blurred vision.  Current diet consists of plenty of food and vegetables.  Eats fast food infrequently.  Does not have much of an appetite and eats 1-2 meals a day.  HTN Takes amlodipine 10mg  daily, lisinopril 40 mg daily, spirnolactone 25 mg daily and metoprolol 25 mg twice daily.  Tolerating well without side effects.  Denies chest pain, dizziness, palpitations, dyspnea, edema or cough.  Patient does take her blood pressures at home 1-2 times a day.  Recent blood pressures have [name] 140/80 and 137/70.  HLD  Takes atorvastatin 20mg  once daily.  Tolerating well without side effects denies myalgias, RUQ pain   PERTINENT  PMH / PSH: Hypertension, prediabetes, hyperlipidemia, fibroids, obesity  OBJECTIVE:   BP 132/80   Pulse 78   Ht 5\' 4"  (1.626 m)   Wt 206 lb 6.4 oz (93.6 kg)   SpO2 94%   BMI 35.43 kg/m    General: Alert, no acute distress, appears stated age  Cardio: Normal S1 and S2, RRR Pulm: normal respiratory effort Abdomen: Bowel sounds normal. Abdomen soft and non-tender.  Extremities: No peripheral edema. Warm/ well perfused.   Neuro: Cranial nerves grossly intact  ASSESSMENT/PLAN:   HYPERTENSION, BENIGN SYSTEMIC BP 132/80 and at goal.  Considered stopping 1 of patient's antihypertensives however recommended  patient comes in for nursing visit next week to check blood pressure again and if blood pressure under 140/90 can stop amlodipine.  Continue amlodipine, lisinopril spironolactone and metoprolol at current doses for now.  HLD (hyperlipidemia) Tolerating atorvastatin 20 mg well.  Lipid panel obtained today. Could consider increasing atorvastatin dose if abnormal cholesterol panel.  We will contact patient with results.  Prediabetes A1c 5.8 and patient is still prediabetic range. Congratulated patient on  this result. Provided patient with diet and lifestyle modifications. Will recheck A1c again later this year in 3 to 6 months.  Did discuss the possibility of starting Metformin if she does develop diabetes in the future.     Sarah Haw, MD PGY-2 [place]

## 2020-06-02 LAB — LIPID PANEL
Chol/HDL Ratio: 4.2 ratio (ref 0.0–4.4)
Cholesterol, Total: 169 mg/dL (ref 100–199)
HDL: 40 mg/dL (ref >39)
LDL Chol Calc (NIH): 108 mg/dL — ABNORMAL HIGH (ref 0–99)
Triglycerides: 117 mg/dL (ref 0–149)
VLDL Cholesterol Cal: 21 mg/dL (ref 5–40)

## 2020-06-08 ENCOUNTER — Ambulatory Visit (INDEPENDENT_AMBULATORY_CARE_PROVIDER_SITE_OTHER): Payer: Medicare HMO

## 2020-06-08 ENCOUNTER — Other Ambulatory Visit: Payer: Self-pay

## 2020-06-08 VITALS — BP 134/80 | HR 72

## 2020-06-08 DIAGNOSIS — Z23 Encounter for immunization: Secondary | ICD-10-CM | POA: Diagnosis not present

## 2020-06-08 DIAGNOSIS — Z0130 Encounter for examination of blood pressure without abnormal findings: Secondary | ICD-10-CM

## 2020-06-08 NOTE — Progress Notes (Signed)
Patient here today for BP check.      Last BP was on 06/01/2020 and was 132/80.  BP today is 134/80 with a pulse of 72, SPO2 98%.    Checked BP in left arm with normal adult cuff.    Symptoms present: none.   Patient last took BP med 0800 today.    Patient is also here to receive pneumovax vaccine. Patient denies history of allergic reaction to any vaccine. Administered in RD, site unremarkable, tolerated injection well.     Routed note to PCP.       Talbot Grumbling, RN

## 2020-07-06 ENCOUNTER — Ambulatory Visit: Payer: Medicare HMO

## 2020-10-30 ENCOUNTER — Other Ambulatory Visit: Payer: Self-pay

## 2020-10-30 MED ORDER — LISINOPRIL 40 MG PO TABS
40.0000 mg | ORAL_TABLET | Freq: Every day | ORAL | 1 refills | Status: DC
Start: 2020-10-30 — End: 2021-01-05

## 2020-12-19 ENCOUNTER — Other Ambulatory Visit: Payer: Self-pay

## 2020-12-19 ENCOUNTER — Ambulatory Visit (INDEPENDENT_AMBULATORY_CARE_PROVIDER_SITE_OTHER): Payer: Medicare (Managed Care)

## 2020-12-19 ENCOUNTER — Ambulatory Visit: Payer: Medicare (Managed Care) | Admitting: Podiatry

## 2020-12-19 DIAGNOSIS — M79672 Pain in left foot: Secondary | ICD-10-CM

## 2020-12-19 DIAGNOSIS — M7752 Other enthesopathy of left foot: Secondary | ICD-10-CM

## 2020-12-19 DIAGNOSIS — M21962 Unspecified acquired deformity of left lower leg: Secondary | ICD-10-CM | POA: Diagnosis not present

## 2020-12-19 NOTE — Progress Notes (Signed)
  Subjective:  Patient ID: Sarah [name], female    DOB: 07-24-1951,  MRN: [id number]  Chief Complaint  Patient presents with  . left foot     Left foot soreness , painful - no redness- pain is a 8-9/10    70 y.o. female presents with the above complaint. History confirmed with patient. Hurts at the 5th toe area.  Objective:  Physical Exam: warm, good capillary refill, no trophic changes or ulcerative lesions, normal DP and PT pulses and normal sensory exam. Chronic severe varus foot deformity. Pain and HPK formation at 5th met head plantarly and 5th met base.  No images are attached to the encounter.   Assessment:   1. Foot pain, left   2. Deformity of metatarsal bone of left foot   3. Capsulitis of metatarsophalangeal (MTP) joint of left foot    Plan:  Patient was evaluated and treated and all questions answered.  Capsulitis Left; Varus foot type s/p Polio -Lesions courtesy debrided -Would benefit from custom shoes. Has not ordered them -F/u should issues persist.  Return if symptoms worsen or fail to improve.

## 2021-01-02 ENCOUNTER — Telehealth: Payer: Self-pay | Admitting: Podiatry

## 2021-01-02 NOTE — Telephone Encounter (Signed)
Pt called about getting custom shoes for this year. She was molded for last year but got sick and was never able to come in to put down payment. I explained to pt that she needs to be remolded and have scheduled her to see EJ on 4.29. But she is not diabetic so insurance is not going to cover them. She is asking what the cost is and how much would she need to put down? Last yr per Liliane Channel it was 600.00 dollars and minimum of 300.00 down

## 2021-01-03 NOTE — Telephone Encounter (Signed)
The minimum down would still be 300 dollars

## 2021-01-05 ENCOUNTER — Other Ambulatory Visit: Payer: Self-pay | Admitting: Family Medicine

## 2021-01-29 ENCOUNTER — Telehealth: Payer: Self-pay

## 2021-01-29 NOTE — Telephone Encounter (Signed)
Fax received from pharmacy asking for a 90 day supply of Lisinopril. The co-pay is the same or less than the single 30 day supply. Please resend Rx if this is appropriate for this patient. Ottis Stain, CMA

## 2021-01-30 ENCOUNTER — Other Ambulatory Visit: Payer: Self-pay | Admitting: Family Medicine

## 2021-01-30 MED ORDER — LISINOPRIL 40 MG PO TABS
40.0000 mg | ORAL_TABLET | Freq: Every day | ORAL | 0 refills | Status: DC
Start: 2021-01-30 — End: 2021-04-20

## 2021-01-30 NOTE — Telephone Encounter (Signed)
Refilled Lisinopril for 90 days. Please could you inform the patient? Thank you.

## 2021-02-02 ENCOUNTER — Other Ambulatory Visit: Payer: Medicare (Managed Care)

## 2021-03-16 ENCOUNTER — Other Ambulatory Visit: Payer: Self-pay | Admitting: Family Medicine

## 2021-03-16 DIAGNOSIS — Z1231 Encounter for screening mammogram for malignant neoplasm of breast: Secondary | ICD-10-CM

## 2021-04-20 ENCOUNTER — Other Ambulatory Visit: Payer: Self-pay | Admitting: Family Medicine

## 2021-05-11 ENCOUNTER — Ambulatory Visit
Admission: RE | Admit: 2021-05-11 | Discharge: 2021-05-11 | Disposition: A | Discharge status: Home / Self Care | Payer: Medicare HMO | Source: Ambulatory Visit | Attending: Family Medicine | Admitting: Family Medicine

## 2021-05-11 ENCOUNTER — Other Ambulatory Visit: Payer: Self-pay

## 2021-05-11 DIAGNOSIS — Z1231 Encounter for screening mammogram for malignant neoplasm of breast: Secondary | ICD-10-CM | POA: Diagnosis not present

## 2021-05-22 ENCOUNTER — Other Ambulatory Visit: Payer: Self-pay | Admitting: Family Medicine

## 2021-06-08 DIAGNOSIS — B91 Sequelae of poliomyelitis: Secondary | ICD-10-CM | POA: Diagnosis not present

## 2021-06-08 DIAGNOSIS — Z809 Family history of malignant neoplasm, unspecified: Secondary | ICD-10-CM | POA: Diagnosis not present

## 2021-06-08 DIAGNOSIS — R600 Localized edema: Secondary | ICD-10-CM | POA: Diagnosis not present

## 2021-06-08 DIAGNOSIS — E785 Hyperlipidemia, unspecified: Secondary | ICD-10-CM | POA: Diagnosis not present

## 2021-06-08 DIAGNOSIS — M8960 Osteopathy after poliomyelitis, unspecified site: Secondary | ICD-10-CM | POA: Diagnosis not present

## 2021-06-08 DIAGNOSIS — E669 Obesity, unspecified: Secondary | ICD-10-CM | POA: Diagnosis not present

## 2021-06-08 DIAGNOSIS — G629 Polyneuropathy, unspecified: Secondary | ICD-10-CM | POA: Diagnosis not present

## 2021-06-08 DIAGNOSIS — Z7982 Long term (current) use of aspirin: Secondary | ICD-10-CM | POA: Diagnosis not present

## 2021-06-08 DIAGNOSIS — R269 Unspecified abnormalities of gait and mobility: Secondary | ICD-10-CM | POA: Diagnosis not present

## 2021-06-08 DIAGNOSIS — I10 Essential (primary) hypertension: Secondary | ICD-10-CM | POA: Diagnosis not present

## 2021-06-15 ENCOUNTER — Other Ambulatory Visit: Payer: Self-pay | Admitting: *Deleted

## 2021-06-16 ENCOUNTER — Telehealth: Payer: Self-pay | Admitting: Family Medicine

## 2021-06-16 MED ORDER — SPIRONOLACTONE 25 MG PO TABS: 25.0000 mg | ORAL_TABLET | Freq: Every day | ORAL | 3 refills | Status: DC

## 2021-06-16 NOTE — Telephone Encounter (Signed)
Thank you Dr Ardelia Mems. I have also message white team and admin to book appointment for physical and labs.

## 2021-06-16 NOTE — Telephone Encounter (Signed)
I'm the on call attending this weekend and got a page from [facility] about the spiro refill on this patient, sent in earlier today by Dr. Posey Pronto.  They did not have records of her being on this previously; I confirmed in her record she has been on this.  Noted no recent BMET on file. Needs visit for BP check and lab in the very near future to check potassium given combo of ACE and spiro.  White team, can you call patient and get her scheduled for an appointment?  Thanks! Leeanne Rio, MD

## 2021-06-18 NOTE — Telephone Encounter (Signed)
Called pt. Pt is + for COVID has 8 more days of quarantine.  Scheduled pt for an appt on 9/29. Ottis Stain, CMA

## 2021-06-21 ENCOUNTER — Other Ambulatory Visit: Payer: Self-pay | Admitting: Family Medicine

## 2021-07-04 NOTE — Assessment & Plan Note (Addendum)
BP163/95.  No red flag symptoms.  Continue amlodipine, lisinopril, spironolactone, and metoprolol.  Metoprolol refilled.  Advised to check blood pressure daily while on medication and return in 2 to 3 weeks for follow-up. BMet checked today due to hyperkalemia risk of lisinopril and spironolactone.

## 2021-07-04 NOTE — Assessment & Plan Note (Addendum)
Tolerating Atorvastatin 20mg  well.  Has been out of medication for 1 week.  Refill submitted.  Lipid panel obtained today. Will consider dosage adjustment based on results.

## 2021-07-04 NOTE — Assessment & Plan Note (Addendum)
A1c today 6.2, discussed with pt.  Advised regular exercise and improve diet to avoid progression to diabetes.

## 2021-07-04 NOTE — Progress Notes (Signed)
  SUBJECTIVE:   CHIEF COMPLAINT / HPI:   Pt presents today for physical without specific concerns with the exception of medication refills.   HTN: Pt is currently on amlodipine 10mg  daily, lisinopril 40mg  daily, lopressor 25mg  BID, Spironolactone 25mg  daily. She is out of her metoprolol x1 wk. Requesting refill today. Denies chest pain, flashing lights in her eyes, palpitations, SOB on exertion. She has checked her BP at home the last couple days which has been 154/85 and 160/87 but these are without taking the metoprolol.  HLD: Currently taking atorvastatin 20mg . She is requesting refill as she has been out of this medication for 1 week.   Prediabetes: A1c 5.8 a year ago. A1c today 6.2.  Interested in flu vaccine today.  PERTINENT  PMH / PSH: HLD, HTN, prediabetes, deformity of left foot, hx of polio  OBJECTIVE:   BP (!) 163/95   Pulse 74   Ht 5\' 4"  (1.626 m)   Wt 207 lb 9.6 oz (94.2 kg)   SpO2 100%   BMI 35.63 kg/m    General: NAD, pleasant, able to participate in exam Cardiac: RRR, no murmurs. Respiratory: CTAB, normal effort, No wheezes, rales or rhonchi Abdomen: Bowel sounds present, nontender, nondistended Skin: warm and dry, no rashes noted Neuro: alert, no obvious focal deficits and normal speech Psych: Normal affect and mood  ASSESSMENT/PLAN:   HYPERTENSION, BENIGN SYSTEMIC BP163/95.  No red flag symptoms.  Continue amlodipine, lisinopril, spironolactone, and metoprolol.  Metoprolol refilled.  Advised to check blood pressure daily while on medication and return in 2 to 3 weeks for follow-up. BMet checked today due to hyperkalemia risk of lisinopril and spironolactone.  Prediabetes A1c today 6.2, discussed with pt.  Advised regular exercise and improve diet to avoid progression to diabetes.  HLD (hyperlipidemia) Tolerating Atorvastatin 20mg  well.  Has been out of medication for 1 week.  Refill submitted.  Lipid panel obtained today. Will consider dosage adjustment  based on results.  Health care maintenance Received flu vaccine today.  Patient states she will get shingles vaccine at pharmacy next week.  Patient was given advanced directives booklet.  Annual Examination Female over 25 yo  I reviewed the following patient responses on our Physical Exam Form Tobacco use and candidacy for lung cancer screening Alcohol Use  Weight  Exercise  Risk for STI  Fall risk Advanced Directive Increased family cancer risk Violence risk  PHQ9 score reviewed  Blood pressure reviewed  I considered the following items based upon USPSTF recommendations: Cervical Cancer if female at birth 23 Colon cancer screening Osteoporosis screening HIV testing:  Hepatitis C testing Cholesterol screening STI screening if high risk (Hepatitis B, Syphilis, Gonorrhea, Chlamydia) Immunizations - Influenza, Covid, [place], Pneumonia, Tetanus  [name], [place]

## 2021-07-05 ENCOUNTER — Encounter: Payer: Self-pay | Admitting: Student

## 2021-07-05 ENCOUNTER — Ambulatory Visit (INDEPENDENT_AMBULATORY_CARE_PROVIDER_SITE_OTHER): Payer: Medicare HMO | Admitting: Student

## 2021-07-05 ENCOUNTER — Other Ambulatory Visit: Payer: Self-pay

## 2021-07-05 VITALS — BP 163/95 | HR 74 | Ht 64.0 in | Wt 207.6 lb

## 2021-07-05 DIAGNOSIS — I10 Essential (primary) hypertension: Secondary | ICD-10-CM

## 2021-07-05 DIAGNOSIS — R7303 Prediabetes: Secondary | ICD-10-CM

## 2021-07-05 DIAGNOSIS — E785 Hyperlipidemia, unspecified: Secondary | ICD-10-CM | POA: Diagnosis not present

## 2021-07-05 DIAGNOSIS — Z23 Encounter for immunization: Secondary | ICD-10-CM | POA: Diagnosis not present

## 2021-07-05 DIAGNOSIS — Z0000 Encounter for general adult medical examination without abnormal findings: Secondary | ICD-10-CM | POA: Diagnosis not present

## 2021-07-05 LAB — POCT GLYCOSYLATED HEMOGLOBIN (HGB A1C): HbA1c, POC (controlled diabetic range): 6.2 % (ref 0.0–7.0)

## 2021-07-05 MED ORDER — METOPROLOL TARTRATE 25 MG PO TABS: 25.0000 mg | ORAL_TABLET | Freq: Two times a day (BID) | ORAL | 2 refills | Status: DC

## 2021-07-05 MED ORDER — ATORVASTATIN CALCIUM 20 MG PO TABS: 20.0000 mg | ORAL_TABLET | Freq: Every day | ORAL | 2 refills | Status: DC

## 2021-07-05 NOTE — Patient Instructions (Signed)
It was great to see you today! Thank you for choosing [facility] for your primary care. Sarah [name] was seen for healthcare maintenance visit.   Our plans for today were:  -Hypertension: Refill your metoprolol.  Please pick this up and continue only blood pressure medications in addition to checking blood pressure once a day at home while on these medications.  Would like to follow-up for your blood pressure in 2 to 3 weeks.  We are also checking a metabolic panel as you are due. -Hyperlipidemia: I refilled your atorvastatin.  We are checking a lipid panel today. -Received your flu vaccine today -Given you in advance directives booklet for long-term health concerns.  I advised this with all my patients as it is important to prepare for any unexpected outcomes down the road.  We are checking some labs today. If they are abnormal, I will call you. If they are normal, I will send you a MyChart message or a letter. If you do not hear about your labs in the next 2 weeks, please let us know.  You should return to our clinic in 2 to 3 weeks for hypertension follow-up.   I recommend that you always bring your medications to each appointment as this makes it easy to ensure you are on the correct medications and helps [place] not miss refills when you need them.  Please arrive 15 minutes before your appointment to ensure smooth check in process.  We appreciate your efforts in making this happen.  Take care and seek immediate care sooner if you develop any concerns.   Thank you for allowing me to participate in your care, [name], DO 07/05/2021, 11:36 AM PGY-1, [place]

## 2021-07-05 NOTE — Assessment & Plan Note (Addendum)
Received flu vaccine today.  Patient states she will get shingles vaccine at pharmacy next week.  Patient was given advanced directives booklet.

## 2021-07-06 ENCOUNTER — Telehealth: Payer: Self-pay | Admitting: Student

## 2021-07-06 DIAGNOSIS — E7849 Other hyperlipidemia: Secondary | ICD-10-CM

## 2021-07-06 DIAGNOSIS — E875 Hyperkalemia: Secondary | ICD-10-CM

## 2021-07-06 LAB — BASIC METABOLIC PANEL
BUN/Creatinine Ratio: 12 (ref 12–28)
BUN: 10 mg/dL (ref 8–27)
CO2: 25 mmol/L (ref 20–29)
Calcium: 9.9 mg/dL (ref 8.7–10.3)
Chloride: 101 mmol/L (ref 96–106)
Creatinine, Ser: 0.83 mg/dL (ref 0.57–1.00)
Glucose: 125 mg/dL — ABNORMAL HIGH (ref 70–99)
Potassium: 5.3 mmol/L — ABNORMAL HIGH (ref 3.5–5.2)
Sodium: 138 mmol/L (ref 134–144)
eGFR: 77 mL/min/{1.73_m2} (ref >59)

## 2021-07-06 LAB — LIPID PANEL
Chol/HDL Ratio: 5.9 ratio — ABNORMAL HIGH (ref 0.0–4.4)
Cholesterol, Total: 225 mg/dL — ABNORMAL HIGH (ref 100–199)
HDL: 38 mg/dL — ABNORMAL LOW (ref >39)
LDL Chol Calc (NIH): 169 mg/dL — ABNORMAL HIGH (ref 0–99)
Triglycerides: 98 mg/dL (ref 0–149)
VLDL Cholesterol Cal: 18 mg/dL (ref 5–40)

## 2021-07-06 MED ORDER — ATORVASTATIN CALCIUM 40 MG PO TABS: 40.0000 mg | ORAL_TABLET | Freq: Every day | ORAL | 3 refills | Status: DC

## 2021-07-06 NOTE — Telephone Encounter (Signed)
Spoke with patient over the phone.  Discussed that her cholesterol levels were elevated and I would advise increasing medication dosage of atorvastatin to 40 mg.  She had not yet picked up refill of the atorvastatin 20 mg.  I discussed with her that I have prescribed 40 mg of atorvastatin to take daily and to take this in place of 20 mg.  Patient understood and repeated back directions. Also discussed hyperkalemia.  Patient denies any NSAID use or over-the-counter supplement use.  Advised recheck at next visit and increased fluid intake in addition to avoiding processed foods which may be fortified with salt additives.

## 2021-07-06 NOTE — Telephone Encounter (Signed)
Called patient regarding lab results. Unable to reach them. Left HIPAA-compliant VM that I would like to discuss results with them over the phone. 1st attempt.   Intending to increase atorvastatin dosage and discuss hyperkalemia causes and consider recheck.

## 2021-08-01 NOTE — Progress Notes (Signed)
  SUBJECTIVE:   CHIEF COMPLAINT / HPI:   Hypertension: Follow up today. She currently takes Amlodipine 10mg , lisinopril 40mg , metoprolol 25mg  BID, Spironolactone 25mg . Her blood pressures at home are in the 150s/70s-80s - per patient. Forgot blood pressure log at home. She did not take her medications this morning but states she takes them every day. No symptoms of chest pain or headache. Recalls that we are monitoring blood pressure to avoid stroke in the future. Does not recall taking a thiazide medication in the past. Does not need refills at this time.  Hyperkalemia: Yesterday's meals included home made stew beef soup w/ non-salted crackers. Apple sauce. Oatmeal with buttered toast, 2 cups of coffee. Denies NSAID use.  General health maintenance: Pt asked if she is due for her colonoscopy as she received a call stating she was due. Amenable to receiving COVID booster today.  PERTINENT  PMH / PSH: HTN, HLD, prediabetes  OBJECTIVE:  BP (!) 148/82   Pulse (!) 54   Ht 5\' 4"  (1.626 m)   Wt 204 lb (92.5 kg)   SpO2 99%   BMI 35.02 kg/m    General: NAD, pleasant, able to participate in exam Cardiac: RRR, no murmurs. Respiratory: CTAB, normal effort, No wheezes, rales or rhonchi  ASSESSMENT/PLAN:  HYPERTENSION, BENIGN SYSTEMIC BP 148/82 on recheck which is encouraging given she did not take her medications this morning. No red flag symptoms elicited by patient. Consider adding HCTZ given patient is already maxed on several medications. Metoprolol should not be increased at this time given HR is 54. Given blood pressure log with instructions to return in 3 weeks for f/u and to bring log, BP cuff, and medications. Pt able to recall that we are monitoring this to prevent stroke/MI in the future.  Hyperkalemia Hyperkalemia identified on last BMet. Recheck Bmet today. Reviewed avoiding NSAIDs, increasing water intake, and avoiding preservatives in food as they can contain potassium. Likely  impacted by lisinopril and spironolactone. HCTZ may help to assist with managing this in the future.   Health care maintenance Received COVID booster today. Colonoscopy not due until 2023.   [name], DO 08/02/2021, 1:00 PM PGY-1, [place]

## 2021-08-02 ENCOUNTER — Encounter: Payer: Self-pay | Admitting: Student

## 2021-08-02 ENCOUNTER — Ambulatory Visit (INDEPENDENT_AMBULATORY_CARE_PROVIDER_SITE_OTHER): Payer: Medicare HMO | Admitting: Student

## 2021-08-02 ENCOUNTER — Other Ambulatory Visit: Payer: Self-pay

## 2021-08-02 ENCOUNTER — Ambulatory Visit (INDEPENDENT_AMBULATORY_CARE_PROVIDER_SITE_OTHER): Payer: Medicare HMO

## 2021-08-02 VITALS — BP 148/82 | HR 54 | Ht 64.0 in | Wt 204.0 lb

## 2021-08-02 DIAGNOSIS — Z23 Encounter for immunization: Secondary | ICD-10-CM

## 2021-08-02 DIAGNOSIS — Z0000 Encounter for general adult medical examination without abnormal findings: Secondary | ICD-10-CM

## 2021-08-02 DIAGNOSIS — E875 Hyperkalemia: Secondary | ICD-10-CM | POA: Diagnosis not present

## 2021-08-02 DIAGNOSIS — I10 Essential (primary) hypertension: Secondary | ICD-10-CM

## 2021-08-02 HISTORY — DX: Hyperkalemia: E87.5

## 2021-08-02 NOTE — Assessment & Plan Note (Signed)
BP 148/82 on recheck which is encouraging given she did not take her medications this morning. No red flag symptoms elicited by patient. Consider adding HCTZ given patient is already maxed on several medications. Metoprolol should not be increased at this time given HR is 54. Given blood pressure log with instructions to return in 3 weeks for f/u and to bring log, BP cuff, and medications. Pt able to recall that we are monitoring this to prevent stroke/MI in the future.

## 2021-08-02 NOTE — Assessment & Plan Note (Signed)
Received COVID booster today. Colonoscopy not due until 2023.

## 2021-08-02 NOTE — Assessment & Plan Note (Signed)
Hyperkalemia identified on last BMet. Recheck Bmet today. Reviewed avoiding NSAIDs, increasing water intake, and avoiding preservatives in food as they can contain potassium. Likely impacted by lisinopril and spironolactone. HCTZ may help to assist with managing this in the future.

## 2021-08-02 NOTE — Patient Instructions (Signed)
It was great to see you today! Thank you for choosing [facility] for your primary care. Sarah [name] was seen for blood pressure.  Our plans for today were:  - No changes to your medications today. We may consider changes based on your home log when you come back at next visit. Please bring this, your medications, and your blood pressure cuff at your next visit. - Your next colonoscopy isn't due until 2023 - You received your covid booster today.  We are checking some labs today. If they are abnormal, I will call you. If they are normal, I will send you a MyChart message or a letter. If you do not hear about your labs in the next 2 weeks, please let us know.  You should return to our clinic in 3 weeks for blood pressure follow up.   Please bring your medications and blood pressure cuff so that we may go over them together.  Please arrive 15 minutes before your appointment to ensure smooth check in process.  We appreciate your efforts in making this happen.  Take care and seek immediate care sooner if you develop any concerns.   Thank you for allowing me to participate in your care, [name], DO 08/02/2021, 9:26 AM PGY-1, [place]

## 2021-08-03 LAB — BASIC METABOLIC PANEL
BUN/Creatinine Ratio: 17 (ref 12–28)
BUN: 15 mg/dL (ref 8–27)
CO2: 25 mmol/L (ref 20–29)
Calcium: 9.5 mg/dL (ref 8.7–10.3)
Chloride: 105 mmol/L (ref 96–106)
Creatinine, Ser: 0.87 mg/dL (ref 0.57–1.00)
Glucose: 134 mg/dL — ABNORMAL HIGH (ref 70–99)
Potassium: 5.3 mmol/L — ABNORMAL HIGH (ref 3.5–5.2)
Sodium: 142 mmol/L (ref 134–144)
eGFR: 73 mL/min/{1.73_m2} (ref >59)

## 2021-08-07 ENCOUNTER — Telehealth: Payer: Self-pay | Admitting: Student

## 2021-08-07 MED ORDER — HYDROCHLOROTHIAZIDE 25 MG PO TABS: 25.0000 mg | ORAL_TABLET | Freq: Every day | ORAL | 0 refills | Status: DC

## 2021-08-07 NOTE — Progress Notes (Signed)
Called pt and spoke with her regarding hyperkalemia. Plan to stop Aldactone and start HCTZ 25mg  daily. Discussed how this acts as a diuretic similar to Aldactone but would not have the potassium-sparing effects. Pt amenable to change - rx sent to pharmacy. Aldactone discontinued in chart. She has continued to take her BP at home daily and recording them in her log. Generally 140s/80s. Reminded pt to bring log, medications, and BP cuff to next visit on 08/21/21. Pt did not have any further questions.

## 2021-08-07 NOTE — Telephone Encounter (Signed)
Error

## 2021-08-07 NOTE — Addendum Note (Signed)
Addended by: Eustace Quail on: 08/07/2021 11:37 AM   Modules accepted: Orders

## 2021-08-20 ENCOUNTER — Other Ambulatory Visit: Payer: Self-pay | Admitting: Family Medicine

## 2021-08-21 ENCOUNTER — Encounter: Payer: Self-pay | Admitting: Student

## 2021-08-21 ENCOUNTER — Ambulatory Visit (INDEPENDENT_AMBULATORY_CARE_PROVIDER_SITE_OTHER): Payer: Medicare HMO | Admitting: Student

## 2021-08-21 ENCOUNTER — Other Ambulatory Visit: Payer: Self-pay

## 2021-08-21 ENCOUNTER — Encounter: Payer: Self-pay | Admitting: Family Medicine

## 2021-08-21 VITALS — BP 199/98 | HR 74 | Wt 207.4 lb

## 2021-08-21 DIAGNOSIS — E875 Hyperkalemia: Secondary | ICD-10-CM

## 2021-08-21 DIAGNOSIS — I10 Essential (primary) hypertension: Secondary | ICD-10-CM | POA: Diagnosis not present

## 2021-08-21 DIAGNOSIS — Z6835 Body mass index (BMI) 35.0-35.9, adult: Secondary | ICD-10-CM | POA: Insufficient documentation

## 2021-08-21 NOTE — Assessment & Plan Note (Addendum)
BP 199/98 on recheck. Did not take medications this morning. No red flag sxs elicted by pt. Consider adding spironolactone at lower dose than previous, pending BMP results. Given BP log with instructions to return in 3-4 weeks for f/u. Encouraged introducing walking regimen to aid in blood pressure management. Goal <140s/80s

## 2021-08-21 NOTE — Progress Notes (Signed)
  SUBJECTIVE:   CHIEF COMPLAINT / HPI:   Hypertension: Follow up today. She currently takes Amlodipine 10mg , lisinopril 40mg , metoprolol 25mg  BID, HCTZ 25mg  daily.  Her blood pressures at home are in the 140s/80s according to BP log when she takes her medications, and run in 170s/89s without medication. She did not take her medications this morning. Denies cheat pain, headaches, vision changes, dyspnea on exertion.   Hyperkalemia: Discontinued spironolactone and started HCTZ at last visit for repeated K at 5.3.  PERTINENT  PMH / PSH: HTN, prediabetes, HLD  OBJECTIVE:  BP (!) 199/98   Pulse 74   Wt 207 lb 6.4 oz (94.1 kg)   SpO2 100%   BMI 35.60 kg/m   General: NAD, pleasant, able to participate in exam Cardiac: RRR, no murmurs. Respiratory: CTAB, normal effort, No wheezes, rales or rhonchi  ASSESSMENT/PLAN:  HYPERTENSION, BENIGN SYSTEMIC BP 199/98 on recheck. Did not take medications this morning. No red flag sxs elicted by pt. Consider adding spironolactone at lower dose than previous, pending BMP results. Given BP log with instructions to return in 3-4 weeks for f/u. Encouraged introducing walking regimen to aid in blood pressure management. Goal <140s/80s  Hyperkalemia Recheck BMet today   [name], DO 08/21/2021, 4:38 PM PGY-1, [place]

## 2021-08-21 NOTE — Assessment & Plan Note (Signed)
Recheck BMet today

## 2021-08-21 NOTE — Patient Instructions (Addendum)
It was great to see you today! Thank you for choosing [facility] for your primary care. Sarah [name] was seen for HTN and hyperkalemia follow up.  Our plans for today were:  - Recheck potassium level.  I will call you with the results and to further discuss managing her blood pressure with medications.  We will not make any changes at this time.  - Your blood pressures are still elevated significantly. I have printed you another log to fill out everyday. Please set an alarm clock for a couple hours after you take your medications everyday so that you check your blood pressure and remember to log the pressures and take your medications that day. -We have discussed introducing an exercise regimen.  This can be as simple as brisk walking at the gym starting with 5 minutes at a time and slowly working herself up.  The general recommendation is greater than 150 minutes/week.  We are checking some labs today. If they are abnormal, I will call you. If they are normal, I will send you a MyChart message or a letter. If you do not hear about your labs in the next 2 weeks, please let us know.  You should return to our clinic in 3 weeks for blood pressure follow-up.   I recommend that you always bring your medications to each appointment as this makes it easy to ensure you are on the correct medications and helps [place] not miss refills when you need them.  Please arrive 15 minutes before your appointment to ensure smooth check in process.  We appreciate your efforts in making this happen.  Take care and seek immediate care sooner if you develop any concerns.   Thank you for allowing me to participate in your care, Sarah Guiles, DO 08/21/2021, 9:12 AM PGY-1, [facility]  Date: _______________________ a.m. _____________________(1st reading) _____________________(2nd reading) p.m. _____________________(1st reading) _____________________(2nd reading) Date:  _______________________ a.m. _____________________(1st reading) _____________________(2nd reading) p.m. _____________________(1st reading) _____________________(2nd reading) Date: _______________________ a.m. _____________________(1st reading) _____________________(2nd reading) p.m. _____________________(1st reading) _____________________(2nd reading) Date: _______________________ a.m. _____________________(1st reading) _____________________(2nd reading) p.m. _____________________(1st reading) _____________________(2nd reading) Date: _______________________ a.m. _____________________(1st reading) _____________________(2nd reading) p.m. _____________________(1st reading) _____________________(2nd reading)  Date: _______________________ a.m. _____________________(1st reading) _____________________(2nd reading) p.m. _____________________(1st reading) _____________________(2nd reading) Date: _______________________ a.m. _____________________(1st reading) _____________________(2nd reading) p.m. _____________________(1st reading) _____________________(2nd reading) Date: _______________________ a.m. _____________________(1st reading) _____________________(2nd reading) p.m. _____________________(1st reading) _____________________(2nd reading) Date: _______________________ a.m. _____________________(1st reading) _____________________(2nd reading) p.m. _____________________(1st reading) _____________________(2nd reading) Date: _______________________ a.m. _____________________(1st reading) _____________________(2nd reading) p.m. _____________________(1st reading) _____________________(2nd reading) Date: _______________________ a.m. _____________________(1st reading) _____________________(2nd reading) p.m. _____________________(1st reading) _____________________(2nd reading) Date: _______________________ a.m. _____________________(1st reading) _____________________(2nd reading) p.m.  _____________________(1st reading) _____________________(2nd reading) Date: _______________________ a.m. _____________________(1st reading) _____________________(2nd reading) p.m. _____________________(1st reading) _____________________(2nd reading) Date: _______________________ a.m. _____________________(1st reading) _____________________(2nd reading) p.m. _____________________(1st reading) _____________________(2nd reading) Date: _______________________ a.m. _____________________(1st reading) _____________________(2nd reading) p.m. _____________________(1st reading) _____________________(2nd reading)  Date: _______________________ a.m. _____________________(1st reading) _____________________(2nd reading) p.m. _____________________(1st reading) _____________________(2nd reading) Date: _______________________ a.m. _____________________(1st reading) _____________________(2nd reading) p.m. _____________________(1st reading) _____________________(2nd reading) Date: _______________________ a.m. _____________________(1st reading) _____________________(2nd reading) p.m. _____________________(1st reading) _____________________(2nd reading) Date: _______________________ a.m. _____________________(1st reading) _____________________(2nd reading) p.m. _____________________(1st reading) _____________________(2nd reading) Date: _______________________ a.m. _____________________(1st reading) _____________________(2nd reading) p.m. _____________________(1st reading) _____________________(2nd reading)

## 2021-08-22 LAB — BASIC METABOLIC PANEL
BUN/Creatinine Ratio: 14 (ref 12–28)
BUN: 12 mg/dL (ref 8–27)
CO2: 26 mmol/L (ref 20–29)
Calcium: 9.8 mg/dL (ref 8.7–10.3)
Chloride: 102 mmol/L (ref 96–106)
Creatinine, Ser: 0.87 mg/dL (ref 0.57–1.00)
Glucose: 136 mg/dL — ABNORMAL HIGH (ref 70–99)
Potassium: 5.4 mmol/L — ABNORMAL HIGH (ref 3.5–5.2)
Sodium: 142 mmol/L (ref 134–144)
eGFR: 73 mL/min/{1.73_m2} (ref >59)

## 2021-08-23 ENCOUNTER — Telehealth: Payer: Self-pay | Admitting: Student

## 2021-08-23 NOTE — Telephone Encounter (Signed)
Second attempt at trying to reach patient.  Left HIPAA compliant voicemail, advised I would attempt to call back at same time tomorrow evening.

## 2021-08-25 ENCOUNTER — Telehealth: Payer: Self-pay | Admitting: Student

## 2021-08-25 NOTE — Telephone Encounter (Signed)
Spoke with patient regarding most recent labs.  She states she has been taking her blood pressure medication and her most recent pressures have been in the 140s over 80s.  Advised patient to call clinic to set up ambulatory blood pressure monitoring with pharmacology (Dr. Valentina Lucks or Dr. Georgina Peer).  At this time hyperkalemia persists but is stable and do not feel blood pressure medication changes are appropriate until patient continues to take medications on an every day basis and I am able to verify appropriate blood pressures.  Patient understood plan and verified she would call on Monday to make appointment.

## 2021-09-19 ENCOUNTER — Other Ambulatory Visit: Payer: Self-pay | Admitting: Family Medicine

## 2021-11-02 ENCOUNTER — Other Ambulatory Visit: Payer: Self-pay | Admitting: *Deleted

## 2021-11-03 MED ORDER — AMLODIPINE BESYLATE 10 MG PO TABS: 10.0000 mg | ORAL_TABLET | Freq: Every day | ORAL | 0 refills | Status: DC

## 2021-11-18 ENCOUNTER — Other Ambulatory Visit: Payer: Self-pay | Admitting: Family Medicine

## 2021-11-26 ENCOUNTER — Telehealth: Payer: Self-pay | Admitting: Family Medicine

## 2021-11-26 NOTE — Telephone Encounter (Signed)
Patient was called to schedule AWV. Please assist in scheduling if patient calls back.  Thanks!

## 2021-12-14 NOTE — Progress Notes (Incomplete)
° ° ° °  SUBJECTIVE:   CHIEF COMPLAINT / HPI:   Sarah [name] is a 71 y.o. female presents for physical     Hypertension: Patient's current antihypertensive  medications include: lisinopril '40mg'$ , metoprolol '25mg'$ , amlodipine '10mg'$ , HCTZ '25mg'$  (ran out 2 weeks ago). Compliant with medications and tolerating well without side effects. Last reading was 164/79. Usually 016-010 systolic. Denies any SOB, CP, vision changes, LE edema, medication SEs, or symptoms of hypotension.   Most recent creatinine trend:  Lab Results  Component Value Date   CREATININE 0.87 08/21/2021   CREATININE 0.87 08/02/2021   CREATININE 0.83 07/05/2021     Patient has had a BMP in the past 1 year.   HLD Patient is currently taking atorvastatin '40mg'$ . Endorses compliance and tolerating well without side effects. Denies RUQ pain or myalgias.   Hx of polio several years ago.   [place] Office Visit from 12/17/2021 in [place]  PHQ-9 Total Score 0        Health Maintenance Due  Topic   Zoster Vaccines- Shingrix (2 of 2)      PERTINENT  PMH / PSH:   OBJECTIVE:   BP (!) 183/95    Pulse 79    Ht '5\' 4"'$  (1.626 m)    Wt 200 lb 12.8 oz (91.1 kg)    SpO2 100%    BMI 34.47 kg/m    General: Alert, no acute distress Cardio: Normal S1 and S2, RRR, no r/m/g Pulm: CTAB, normal work of breathing Abdomen: Bowel sounds normal. Abdomen soft and non-tender.  Extremities: No peripheral edema.  Neuro: Cranial nerves grossly intact   ASSESSMENT/PLAN:   No problem-specific Assessment & Plan notes found for this encounter.    Lattie Haw, MD PGY-3 [place]

## 2021-12-17 ENCOUNTER — Ambulatory Visit (INDEPENDENT_AMBULATORY_CARE_PROVIDER_SITE_OTHER): Payer: Medicare HMO | Admitting: Family Medicine

## 2021-12-17 ENCOUNTER — Other Ambulatory Visit: Payer: Self-pay

## 2021-12-17 ENCOUNTER — Encounter: Payer: Self-pay | Admitting: Family Medicine

## 2021-12-17 VITALS — BP 180/88 | HR 79 | Ht 64.0 in | Wt 200.8 lb

## 2021-12-17 DIAGNOSIS — I159 Secondary hypertension, unspecified: Secondary | ICD-10-CM

## 2021-12-17 DIAGNOSIS — E875 Hyperkalemia: Secondary | ICD-10-CM

## 2021-12-17 DIAGNOSIS — I10 Essential (primary) hypertension: Secondary | ICD-10-CM

## 2021-12-17 DIAGNOSIS — E7800 Pure hypercholesterolemia, unspecified: Secondary | ICD-10-CM

## 2021-12-17 MED ORDER — HYDROCHLOROTHIAZIDE 25 MG PO TABS: 25.0000 mg | ORAL_TABLET | Freq: Every day | ORAL | 0 refills | Status: DC

## 2021-12-17 MED ORDER — CARVEDILOL 3.125 MG PO TABS: 3.1250 mg | ORAL_TABLET | Freq: Two times a day (BID) | ORAL | 0 refills | Status: DC

## 2021-12-17 NOTE — Assessment & Plan Note (Signed)
BP elevated to 183/95. Goal 140/90.  Repeat was. Pt is asymptomatic today, no red flags. Recommended she stops metoprolol and starts carvedilol for greater blood pressure lower effect. Refilled HCZT. Recommended BP log which she can bring in at the next visit. Can consider increasing carvedilol dose if still uncontrolled.  Counseled pt on increased exercise, low salt intake and weight loss which can help reduce BP. Also counseled pt on risk of uncontrolled HTN such as stroke and MI. Repeat BMP today. F.u scheduled for 2-3 weeks with me. ?

## 2021-12-17 NOTE — Patient Instructions (Addendum)
Thank you for coming to see me today. It was a pleasure. Today we discussed your blood pressure, it is very high. I recommend stopping METOPROLOL ?Starting COREG twice daily.  ?I have refilled HCTZ medication ? ?We will get some labs today.  If they are abnormal or we need to do something about them, I will call you.  If they are normal, I will send you a message on MyChart (if it is active) or a letter in the mail.  If you don't hear from [place] in 2 weeks, please call the office at the number below. ? ?Some tips for general wellness: ?- Set a goal of 150 minutes (30 minutes five times per week) of moderate exercise---this means you should get your heart rate up for about 30 minutes each weekday. Try walking around the block twice, doing stretches with your family, or even dancing.  ?- Limit alcohol to 2 or fewer drinks per day for men and 1 or fewer drinks per day for women. ?- Limit sodium to 2,300 mg per day (this can add up quickly!).  ?- Increase leafy, green vegetables and fresh fruits--strive for five servings per day  ?- Limit sugar sweetened beverages include juice and soda--- water is best!   ? ?Please follow-up with me in 2 weeks  ? ?If you have any questions or concerns, please do not hesitate to call the office at [phone]. ? ?Best wishes,  ? ?Dr Posey Pronto   ?

## 2021-12-18 ENCOUNTER — Other Ambulatory Visit: Payer: Self-pay | Admitting: Family Medicine

## 2021-12-18 LAB — LIPID PANEL
Chol/HDL Ratio: 3.3 ratio (ref 0.0–4.4)
Cholesterol, Total: 137 mg/dL (ref 100–199)
HDL: 41 mg/dL (ref >39)
LDL Chol Calc (NIH): 78 mg/dL (ref 0–99)
Triglycerides: 95 mg/dL (ref 0–149)
VLDL Cholesterol Cal: 18 mg/dL (ref 5–40)

## 2021-12-18 LAB — BASIC METABOLIC PANEL
BUN/Creatinine Ratio: 25 (ref 12–28)
BUN: 19 mg/dL (ref 8–27)
CO2: 25 mmol/L (ref 20–29)
Calcium: 9.8 mg/dL (ref 8.7–10.3)
Chloride: 101 mmol/L (ref 96–106)
Creatinine, Ser: 0.76 mg/dL (ref 0.57–1.00)
Glucose: 94 mg/dL (ref 70–99)
Potassium: 4.9 mmol/L (ref 3.5–5.2)
Sodium: 140 mmol/L (ref 134–144)
eGFR: 85 mL/min/{1.73_m2} (ref >59)

## 2021-12-18 NOTE — Assessment & Plan Note (Signed)
Obtained lipid panel today. Continue atorvastatin '40mg'$ . ?

## 2022-01-07 NOTE — Progress Notes (Deleted)
? ? ? ?  SUBJECTIVE:  ? ?CHIEF COMPLAINT / HPI:  ? ?Sarah [name] is a 71 y.o. female presents for HTN follow up  ? ? ?Hypertension: ?Patient's current antihypertensive  medications include: HCTZ, carvedilol, amlodipine and lisinopril.  Compliant with medications and tolerating well without side effects.  Checking BP at home with readings between *** and **.  Denies any SOB, CP, vision changes, LE edema, medication SEs, or symptoms of hypotension.  ? ?Most recent creatinine trend:  ?Lab Results  ?Component Value Date  ? CREATININE 0.76 12/17/2021  ? CREATININE 0.87 08/21/2021  ? CREATININE 0.87 08/02/2021  ?  ? ?Patient {HAS HAS [id number]} had a BMP in the past 1 year. ? ? ? ? ? ? ?  ? ?[place] Office Visit from 12/17/2021 in [place]  ?PHQ-9 Total Score 0  ? ?  ?  ? ?Health Maintenance Due  ?Topic  ? Zoster Vaccines- Shingrix (2 of 2)  ? ?  ? ?PERTINENT  PMH / PSH:  ? ?OBJECTIVE:  ? ?There were no vitals taken for this visit.  ? ?General: Alert, no acute distress ?Cardio: Normal S1 and S2, RRR, no r/m/g ?Pulm: CTAB, normal work of breathing ?Abdomen: Bowel sounds normal. Abdomen soft and non-tender.  ?Extremities: No peripheral edema.  ?Neuro: Cranial nerves grossly intact  ? ?ASSESSMENT/PLAN:  ? ?No problem-specific Assessment & Plan notes found for this encounter. ?  ? ?Lattie Haw, MD PGY-3 ?[place]  ?

## 2022-01-08 ENCOUNTER — Ambulatory Visit: Payer: Medicare HMO | Admitting: Family Medicine

## 2022-02-26 ENCOUNTER — Encounter: Payer: Self-pay | Admitting: Family Medicine

## 2022-02-26 ENCOUNTER — Other Ambulatory Visit: Payer: Self-pay

## 2022-02-26 ENCOUNTER — Ambulatory Visit (INDEPENDENT_AMBULATORY_CARE_PROVIDER_SITE_OTHER): Payer: Medicare HMO | Admitting: Family Medicine

## 2022-02-26 VITALS — BP 200/104 | HR 88 | Wt 197.4 lb

## 2022-02-26 DIAGNOSIS — I10 Essential (primary) hypertension: Secondary | ICD-10-CM | POA: Diagnosis not present

## 2022-02-26 MED ORDER — AMLODIPINE BESYLATE 10 MG PO TABS: 10.0000 mg | ORAL_TABLET | Freq: Every day | ORAL | 0 refills | Status: DC

## 2022-02-26 MED ORDER — CARVEDILOL 6.25 MG PO TABS: 6.2500 mg | ORAL_TABLET | Freq: Two times a day (BID) | ORAL | 0 refills | Status: DC

## 2022-02-26 NOTE — Patient Instructions (Addendum)
Thank you for coming to see me today. It was a pleasure. Today we discussed your blood pressure-it is still too high. I recommend INCREASING COREG TO 6.'25MG'$  TWICE A DAY. Refilled amlodipine  Check BP twice a day a keep a diary. If above 180-200 and it doesn't come down or you have headaches, chest pain, vision changes, confused then go to the ED/call us.   We will get some labs today.  If they are abnormal or we need to do something about them, I will call you.  If they are normal, I will send you a message on MyChart (if it is active) or a letter in the mail.  If you don't hear from [place] in 2 weeks, please call the office at the number below.   Please follow-up with me in 1 weeks   If you have any questions or concerns, please do not hesitate to call the office at [phone].  Best wishes,   Dr Posey Pronto

## 2022-02-26 NOTE — Progress Notes (Signed)
     SUBJECTIVE:   CHIEF COMPLAINT / HPI:   Sarah [name] is a 71 y.o. female presents for HTN follow up    Hypertension: Patient's current antihypertensive  medications include: coreg, amlodipine, hctr and lisinopril. Compliant with medications and tolerating well without side effects. Denies any SOB, CP, vision changes, LE edema, medication SEs, or symptoms of hypotension. Has lost 3lb since last visit with increased activity.   Most recent creatinine trend:  Lab Results  Component Value Date   CREATININE 0.85 02/26/2022   CREATININE 0.76 12/17/2021   CREATININE 0.87 08/21/2021     Patient has had a BMP in the past 1 year.   [place] Office Visit from 02/26/2022 in [place]  PHQ-9 Total Score 0         PERTINENT  PMH / PSH: HTN, HLD, fibroids   OBJECTIVE:   BP (!) 200/104   Pulse 88   Wt 197 lb 6.4 oz (89.5 kg)   SpO2 99%   BMI 33.88 kg/m    Repeat 201/105   General: Alert, no acute distress Cardio: well perfused  Pulm: normal work of breathing Neuro: Cranial nerves grossly intact   ASSESSMENT/PLAN:   HYPERTENSION, BENIGN SYSTEMIC Uncontrolled HTN. Asymptomatic. Initial BP 200/104, repeat 201/105. Increased coreg dose from 6.'25mg'$  BID. Continue other antihypertensives. Obtained BMP today. BP recheck in 2 weeks.     Lattie Haw, MD PGY-3 [place]

## 2022-02-27 LAB — BASIC METABOLIC PANEL
BUN/Creatinine Ratio: 14 (ref 12–28)
BUN: 12 mg/dL (ref 8–27)
CO2: 25 mmol/L (ref 20–29)
Calcium: 9.8 mg/dL (ref 8.7–10.3)
Chloride: 99 mmol/L (ref 96–106)
Creatinine, Ser: 0.85 mg/dL (ref 0.57–1.00)
Glucose: 111 mg/dL — ABNORMAL HIGH (ref 70–99)
Potassium: 4.5 mmol/L (ref 3.5–5.2)
Sodium: 139 mmol/L (ref 134–144)
eGFR: 75 mL/min/{1.73_m2} (ref >59)

## 2022-03-04 NOTE — Assessment & Plan Note (Addendum)
Uncontrolled HTN. Asymptomatic. Initial BP 200/104, repeat 201/105. Increased coreg dose from 6.'25mg'$  BID. Continue other antihypertensives. Obtained BMP today. BP recheck in 2 weeks.

## 2022-03-08 ENCOUNTER — Ambulatory Visit: Payer: Medicare HMO | Admitting: Family Medicine

## 2022-03-12 ENCOUNTER — Encounter: Payer: Self-pay | Admitting: *Deleted

## 2022-03-13 ENCOUNTER — Encounter: Payer: Self-pay | Admitting: Family Medicine

## 2022-03-13 ENCOUNTER — Ambulatory Visit (INDEPENDENT_AMBULATORY_CARE_PROVIDER_SITE_OTHER): Payer: Medicare HMO | Admitting: Family Medicine

## 2022-03-13 DIAGNOSIS — I10 Essential (primary) hypertension: Secondary | ICD-10-CM | POA: Diagnosis not present

## 2022-03-13 DIAGNOSIS — T148XXA Other injury of unspecified body region, initial encounter: Secondary | ICD-10-CM | POA: Diagnosis not present

## 2022-03-13 MED ORDER — LIDOCAINE 5 % EX PTCH: 1.0000 | MEDICATED_PATCH | CUTANEOUS | 0 refills | Status: DC

## 2022-03-13 NOTE — Progress Notes (Signed)
     SUBJECTIVE:   CHIEF COMPLAINT / HPI:   Sarah [name] is a 70 y.o. female presents for follow up  Hypertension Patient's current antihypertensive  medications include: amlodipine, coreg, HCTZ and lisinopril. Compliant with medications and tolerating well without side effects.  Checking BP at home with readings between 136/80. Denies any SOB, CP, vision changes, LE edema, medication SEs, or symptoms of hypotension.   Most recent creatinine trend:  Lab Results  Component Value Date   CREATININE 0.85 02/26/2022   CREATININE 0.76 12/17/2021   CREATININE 0.87 08/21/2021    Patient has had a BMP in the past 1 year.  Right rib pain Reports pain over right lower ribs which started last week. Denies falls or injuries. 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 Took tylenol and put warm compressed which helped.   [place] Office Visit from 03/13/2022 in [place]  PHQ-9 Total Score 0         PERTINENT  PMH / PSH: HTN, HLD, prediabetes  OBJECTIVE:   BP (!) 142/82   Pulse 73   Ht '5\' 4"'$  (1.626 m)   Wt 194 lb 12.8 oz (88.4 kg)   SpO2 100%   BMI 33.44 kg/m    General: Alert, no acute distress Cardio: well perfused Pulm: normal work of breathing Tenderness on palpation right lateral inferior rib  Neuro: Cranial nerves grossly intact   ASSESSMENT/PLAN:   HYPERTENSION, BENIGN SYSTEMIC At goal. Pt has also lost weight 3lb since last visit. Congratulated pt, she appears well motivated. Continue amlodipine, coreg, HCTZ and lisinopril. Follow up in 3 months.  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 TTP over right lateral inferior rib. Low suspicion for fracture given lack of obvious injury.  Recommended tylenol '650mg'$  Q6HPRN and prescribed lidocaine patch. Can also try heat and ice PRN. Follow up if no improvement in symptoms.    Lattie Haw,  MD PGY-3 [place]

## 2022-03-13 NOTE — Patient Instructions (Signed)
Thank you for coming to see me today. It was a pleasure. Today we discussed your blood pressure, it is great, well done!! Continue the same medications.  The rib pain is likely a muscular strain, I recommend tylenol '650mg'$  every 4-6 hrs as needed, lidocaine patch, ice/heat etc.   Please follow-up with me as needed   If you have any questions or concerns, please do not hesitate to call the office at [phone].  Best wishes,   Dr Posey Pronto

## 2022-03-14 DIAGNOSIS — T148XXA Other injury of unspecified body region, initial encounter: Secondary | ICD-10-CM

## 2022-03-14 HISTORY — DX: Other injury of unspecified body region, initial encounter: T14.8XXA

## 2022-03-14 NOTE — Assessment & Plan Note (Signed)
At goal. Pt has also lost weight 3lb since last visit. Congratulated pt, she appears well motivated. Continue amlodipine, coreg, HCTZ and lisinopril. Follow up in 3 months.

## 2022-03-14 NOTE — Assessment & Plan Note (Addendum)
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 TTP over right lateral inferior rib. Low suspicion for fracture given lack of obvious injury.  Recommended tylenol '650mg'$  Q6HPRN and prescribed lidocaine patch. Can also try heat and ice PRN. Follow up if no improvement in symptoms.

## 2022-03-18 ENCOUNTER — Other Ambulatory Visit (HOSPITAL_COMMUNITY): Payer: Self-pay

## 2022-03-18 ENCOUNTER — Telehealth: Payer: Self-pay

## 2022-03-18 NOTE — Telephone Encounter (Signed)
A Prior Authorization was initiated for this patients LIDOCAINE PATCHES through CoverMyMeds.   Key: [id number]

## 2022-03-19 NOTE — Telephone Encounter (Signed)
Thank you, agreed.

## 2022-03-19 NOTE — Telephone Encounter (Signed)
Patient returns call to nurse line.   Patient advised of insurance denial and to purchase OTC.   Patient agreed with plan.

## 2022-03-19 NOTE — Telephone Encounter (Signed)
Prior Auth for patients medication LIDOCAINE PATCHES denied by [facility] [employer] via CoverMyMeds.   Reason: DID NOT MEET THE REQUIREMENTS OF A MEDICALLY ACCEPTED INDICATION  CoverMyMeds Key: [id number]   Left voicemail for patient to call back regarding denial. Medication is OTC.

## 2022-03-21 ENCOUNTER — Emergency Department (HOSPITAL_COMMUNITY)
Admission: EM | Admit: 2022-03-21 | Discharge: 2022-03-22 | Disposition: A | Discharge status: Home / Self Care | Payer: Medicare HMO | Attending: Emergency Medicine | Admitting: Emergency Medicine

## 2022-03-21 ENCOUNTER — Other Ambulatory Visit: Payer: Self-pay

## 2022-03-21 ENCOUNTER — Emergency Department (HOSPITAL_COMMUNITY): Payer: Medicare HMO

## 2022-03-21 ENCOUNTER — Encounter (HOSPITAL_COMMUNITY): Payer: Self-pay

## 2022-03-21 DIAGNOSIS — Z743 Need for continuous supervision: Secondary | ICD-10-CM | POA: Diagnosis not present

## 2022-03-21 DIAGNOSIS — R110 Nausea: Secondary | ICD-10-CM | POA: Insufficient documentation

## 2022-03-21 DIAGNOSIS — I10 Essential (primary) hypertension: Secondary | ICD-10-CM | POA: Diagnosis not present

## 2022-03-21 DIAGNOSIS — R42 Dizziness and giddiness: Secondary | ICD-10-CM

## 2022-03-21 DIAGNOSIS — R519 Headache, unspecified: Secondary | ICD-10-CM | POA: Diagnosis not present

## 2022-03-21 DIAGNOSIS — R29818 Other symptoms and signs involving the nervous system: Secondary | ICD-10-CM | POA: Diagnosis not present

## 2022-03-21 LAB — CBC WITH DIFFERENTIAL/PLATELET
Abs Immature Granulocytes: 0.01 10*3/uL (ref 0.00–0.07)
Basophils Absolute: 0.1 10*3/uL (ref 0.0–0.1)
Basophils Relative: 1 %
Eosinophils Absolute: 0.3 10*3/uL (ref 0.0–0.5)
Eosinophils Relative: 5 %
HCT: 40 % (ref 36.0–46.0)
Hemoglobin: 13.2 g/dL (ref 12.0–15.0)
Immature Granulocytes: 0 %
Lymphocytes Relative: 32 %
Lymphs Abs: 1.6 10*3/uL (ref 0.7–4.0)
MCH: 31.1 pg (ref 26.0–34.0)
MCHC: 33 g/dL (ref 30.0–36.0)
MCV: 94.3 fL (ref 80.0–100.0)
Monocytes Absolute: 0.3 10*3/uL (ref 0.1–1.0)
Monocytes Relative: 6 %
Neutro Abs: 2.9 10*3/uL (ref 1.7–7.7)
Neutrophils Relative %: 56 %
Platelets: 206 10*3/uL (ref 150–400)
RBC: 4.24 MIL/uL (ref 3.87–5.11)
RDW: 11.7 % (ref 11.5–15.5)
WBC: 5.1 10*3/uL (ref 4.0–10.5)
nRBC: 0 % (ref 0.0–0.2)

## 2022-03-21 LAB — COMPREHENSIVE METABOLIC PANEL
ALT: 24 U/L (ref 0–44)
AST: 24 U/L (ref 15–41)
Albumin: 4 g/dL (ref 3.5–5.0)
Alkaline Phosphatase: 79 U/L (ref 38–126)
Anion gap: 11 (ref 5–15)
BUN: 11 mg/dL (ref 8–23)
CO2: 25 mmol/L (ref 22–32)
Calcium: 9.1 mg/dL (ref 8.9–10.3)
Chloride: 102 mmol/L (ref 98–111)
Creatinine, Ser: 0.73 mg/dL (ref 0.44–1.00)
GFR, Estimated: >60 mL/min (ref >60)
Glucose, Bld: 149 mg/dL — ABNORMAL HIGH (ref 70–99)
Potassium: 3.4 mmol/L — ABNORMAL LOW (ref 3.5–5.1)
Sodium: 138 mmol/L (ref 135–145)
Total Bilirubin: 0.9 mg/dL (ref 0.3–1.2)
Total Protein: 7.8 g/dL (ref 6.5–8.1)

## 2022-03-21 LAB — TROPONIN I (HIGH SENSITIVITY)
Troponin I (High Sensitivity): 5 ng/L (ref <18)
Troponin I (High Sensitivity): 4 ng/L (ref <18)

## 2022-03-21 LAB — URINALYSIS, ROUTINE W REFLEX MICROSCOPIC
Bilirubin Urine: NEGATIVE
Glucose, UA: NEGATIVE mg/dL
Hgb urine dipstick: NEGATIVE
Ketones, ur: NEGATIVE mg/dL
Leukocytes,Ua: NEGATIVE
Nitrite: NEGATIVE
Protein, ur: NEGATIVE mg/dL
Specific Gravity, Urine: 1.011 (ref 1.005–1.030)
pH: 7 (ref 5.0–8.0)

## 2022-03-21 MED ORDER — MECLIZINE HCL 25 MG PO TABS
25.0000 mg | ORAL_TABLET | Freq: Once | ORAL | Status: AC
Start: 2022-03-21 — End: 2022-03-21
  Administered 2022-03-21: 25 mg via ORAL
  Filled 2022-03-21: qty 1

## 2022-03-21 MED ORDER — ONDANSETRON 4 MG PO TBDP
4.0000 mg | ORAL_TABLET | Freq: Once | ORAL | Status: AC
Start: 2022-03-21 — End: 2022-03-21
  Administered 2022-03-21: 4 mg via ORAL
  Filled 2022-03-21 (×2): qty 1

## 2022-03-21 MED ORDER — MECLIZINE HCL 25 MG PO TABS: 25.0000 mg | ORAL_TABLET | Freq: Three times a day (TID) | ORAL | 0 refills | Status: DC | PRN

## 2022-03-21 NOTE — ED Provider Triage Note (Signed)
Emergency Medicine Provider Triage Evaluation Note  Sarah [name] , a 71 y.o. female  was evaluated in triage.  Pt complains of dizziness.  Patient reports it feels like the entire room is spinning.  Patient has had this sensation since waking this morning.  Sensation is worse with change in position.  Patient denies any previous episodes.  Endorses associated nausea as well.  Denies any recent falls or injuries.  EMS reports that heart rate shot up to the 140s while they were assessing the patient.  Review of Systems  Positive: Dizziness, nausea Negative: Numbness, weakness, facial asymmetry, visual disturbance, chest pain, shortness of breath, palpitations, lightheadedness, syncope  Physical Exam  BP (!) 162/93 (BP Location: Right Arm)   Pulse 86   Temp 98.3 F (36.8 C) (Oral)   Resp 16   SpO2 96%  Gen:   Awake, no distress   Resp:  Normal effort  MSK:   Moves extremities without difficulty  Other:    Medical Decision Making  Medically screening exam initiated at 11:25 AM.  Appropriate orders placed.  Wilfred Lacy was informed that the remainder of the evaluation will be completed by another provider, this initial triage assessment does not replace that evaluation, and the importance of remaining in the ED until their evaluation is complete.     Loni Beckwith, [place] 03/21/22 1127

## 2022-03-21 NOTE — Discharge Instructions (Signed)
You were seen in the ED today with vertigo. Please call your primary care doctor for a follow up appointment. I have called in a prescription for dizziness to your pharmacy. Take as prescribed. If symptoms continue, please call the ENT doctor listed to schedule a follow up appointment.

## 2022-03-21 NOTE — ED Provider Notes (Signed)
Emergency Department Provider Note   I have reviewed the triage vital signs and the nursing notes.   HISTORY  Chief Complaint Dizziness   HPI Sarah [name] is a 71 y.o. female past history reviewed below including high cholesterol and hypertension presents emergency department for evaluation of acute onset vertigo which was present upon waking this morning.  No prior history of vertigo.  No tinnitus or ear pain.  No numbness/unilateral weakness.  Denies headache.  Patient states she awoke this morning and went to go the bathroom but felt like the room was shifting and spinning.  She had difficulty getting back to her bed without falling.  She lay in bed but continued to have symptoms throughout the day with attempted movement.  She ultimately called her family for a ride to the hospital.    Past Medical History:  Diagnosis Date   Arthritis    Hyperlipidemia    Hypertension    Polio 1964    Review of Systems  Constitutional: No fever/chills Eyes: No visual changes. ENT: No sore throat. Positive vertigo.  Cardiovascular: Denies chest pain. Respiratory: Denies shortness of breath. Gastrointestinal: No abdominal pain. Mild nausea, no vomiting.  No diarrhea.  No constipation. Genitourinary: Negative for dysuria. Musculoskeletal: Negative for back pain. Skin: Negative for rash. Neurological: Negative for headaches, focal weakness or numbness.   ____________________________________________   PHYSICAL EXAM:  VITAL SIGNS: ED Triage Vitals  Enc Vitals Group     BP 03/21/22 1104 (!) 162/93     Pulse Rate 03/21/22 1104 86     Resp 03/21/22 1104 16     Temp 03/21/22 1104 98.3 F (36.8 C)     Temp Source 03/21/22 1104 Oral     SpO2 03/21/22 1104 96 %   Constitutional: Alert and oriented. Well appearing and in no acute distress. Eyes: Conjunctivae are normal. PERRL. EOMI. No nystagmus.  Head: Atraumatic. Nose: No congestion/rhinnorhea. Mouth/Throat: Mucous membranes are  moist. Cardiovascular: Normal rate, regular rhythm. Good peripheral circulation. Grossly normal heart sounds.   Respiratory: Normal respiratory effort.  No retractions. Lungs CTAB. Gastrointestinal: Soft and nontender. No distention.  Musculoskeletal: No lower extremity tenderness nor edema. No gross deformities of extremities. Neurologic:  Normal speech and language. No gross focal neurologic deficits are appreciated.  No pronator drift.  5/5 strength in the bilateral upper and lower extremities.  Normal finger-to-nose testing bilaterally.  Skin:  Skin is warm, dry and intact. No rash noted.   ____________________________________________   LABS (all labs ordered are listed, but only abnormal results are displayed)  Labs Reviewed  COMPREHENSIVE METABOLIC PANEL - Abnormal; Notable for the following components:      Result Value   Potassium 3.4 (*)    Glucose, Bld 149 (*)    All other components within normal limits  URINALYSIS, ROUTINE W REFLEX MICROSCOPIC - Abnormal; Notable for the following components:   Color, Urine STRAW (*)    All other components within normal limits  CBC WITH DIFFERENTIAL/PLATELET  TROPONIN I (HIGH SENSITIVITY)  TROPONIN I (HIGH SENSITIVITY)   ____________________________________________  EKG   EKG Interpretation  Date/Time:  Thursday March 21 2022 11:05:20 EDT Ventricular Rate:  86 PR Interval:  178 QRS Duration: 86 QT Interval:  388 QTC Calculation: 464 R Axis:   27 Text Interpretation: Normal sinus rhythm Normal ECG When compared with ECG of 24-Feb-2018 08:45, PREVIOUS ECG IS PRESENT Confirmed by Nanda Quinton [phone]) on 03/21/2022 8:14:56 PM        ____________________________________________  RADIOLOGY  CT HEAD WO CONTRAST (5MM)  Result Date: 03/21/2022 CLINICAL DATA:  Headache, new or worsening. Sudden onset of dizziness today. EXAM: CT HEAD WITHOUT CONTRAST TECHNIQUE: Contiguous axial images were obtained from the base of the skull  through the vertex without intravenous contrast. RADIATION DOSE REDUCTION: This exam was performed according to the departmental dose-optimization program which includes automated exposure control, adjustment of the mA and/or kV according to patient size and/or use of iterative reconstruction technique. COMPARISON:  None FINDINGS: Brain: The brain shows a normal appearance without evidence of malformation, atrophy, old or acute small or large vessel infarction, mass lesion, hemorrhage, hydrocephalus or extra-axial collection. Vascular: No hyperdense vessel. No evidence of atherosclerotic calcification. Skull: Normal.  No traumatic finding.  No focal bone lesion. Sinuses/Orbits: Sinuses are clear. Orbits appear normal. Mastoids are clear. Other: None significant IMPRESSION: Normal head CT Electronically Signed   By: Nelson Chimes M.D.   On: 03/21/2022 11:55    ____________________________________________   PROCEDURES  Procedure(s) performed:   Procedures  None  ____________________________________________   INITIAL IMPRESSION / ASSESSMENT AND PLAN / ED COURSE  Pertinent labs & imaging results that were available during my care of the patient were reviewed by me and considered in my medical decision making (see chart for details).   This patient is Presenting for Evaluation of vertigo, which does require a range of treatment options, and is a complaint that involves a high risk of morbidity and mortality.  The Differential Diagnoses include BPPV, Mnire's. Central vertigo, dehydration.  Critical Interventions-    Medications  meclizine (ANTIVERT) tablet 25 mg (25 mg Oral Given 03/21/22 1139)  ondansetron (ZOFRAN-ODT) disintegrating tablet 4 mg (4 mg Oral Given 03/21/22 1132)    Reassessment after intervention: Symptoms not significantly improved after Meclizine and Zofran.   I did obtain Additional Historical Information from family at bedside.    Clinical Laboratory Tests Ordered,  included but are negative x2.  No acute kidney injury.  No anemia.  Radiologic Tests Ordered, included CT head. I independently interpreted the images and agree with radiology interpretation.   Cardiac Monitor Tracing which shows NSR. No ectopy.    Social Determinants of Health Risk no smoking.   Medical Decision Making: Summary:  Patient presents emergency department with vertigo and nausea.  Hypertensive here although improved with rest.  Mainly the patient is symptomatic with movement increasing my suspicion for peripheral process.  Overall the neurologic exam is reassuring.  Patient did not feel significantly better with meclizine.  Plan for MRI to rule out acute/central process.   Reevaluation with update and discussion with patient. MRI pending. Care transferred to Dr. Tyrone Nine pending MRI. If negative, plan for d/c home with PCP and ENT follow up as needed.   Disposition: pending   ____________________________________________  FINAL CLINICAL IMPRESSION(S) / ED DIAGNOSES  Final diagnoses:  Vertigo     NEW OUTPATIENT MEDICATIONS STARTED DURING THIS VISIT:  New Prescriptions   MECLIZINE (ANTIVERT) 25 MG TABLET    Take 1 tablet (25 mg total) by mouth 3 (three) times daily as needed for dizziness.    Note:  This document was prepared using Dragon voice recognition software and may include unintentional dictation errors.  Nanda Quinton, MD, [facility] Emergency Medicine    Chirstine Defrain, [place], MD 03/22/22 [phone]

## 2022-03-21 NOTE — ED Triage Notes (Signed)
Sudden onset of dizziness this morning. No hx of vertigo.  Reports room was spinning and having blurred visino and nausea.  HTN hx 173/97  upon sitting up patient went tachy in 140's.

## 2022-03-22 ENCOUNTER — Emergency Department (HOSPITAL_COMMUNITY): Payer: Medicare HMO

## 2022-03-22 DIAGNOSIS — R29818 Other symptoms and signs involving the nervous system: Secondary | ICD-10-CM | POA: Diagnosis not present

## 2022-03-22 NOTE — ED Provider Notes (Signed)
Received in signout from Dr. Laverta Baltimore, patient dizzy, thought to be peripheral but plan for MRI brain.  MRI negative.  Patient feeling better wants to go home.    Deno Etienne, DO 03/22/22 0134

## 2022-03-22 NOTE — ED Notes (Signed)
Patient currently in MRI.

## 2022-04-16 ENCOUNTER — Other Ambulatory Visit: Payer: Self-pay | Admitting: Family Medicine

## 2022-04-16 DIAGNOSIS — Z1231 Encounter for screening mammogram for malignant neoplasm of breast: Secondary | ICD-10-CM

## 2022-04-22 ENCOUNTER — Encounter: Payer: Self-pay | Admitting: Student

## 2022-04-22 ENCOUNTER — Ambulatory Visit (INDEPENDENT_AMBULATORY_CARE_PROVIDER_SITE_OTHER): Payer: Medicare HMO | Admitting: Student

## 2022-04-22 VITALS — BP 145/78 | HR 72 | Resp 18 | Wt 195.2 lb

## 2022-04-22 DIAGNOSIS — I10 Essential (primary) hypertension: Secondary | ICD-10-CM | POA: Diagnosis not present

## 2022-04-22 DIAGNOSIS — R42 Dizziness and giddiness: Secondary | ICD-10-CM | POA: Diagnosis not present

## 2022-04-22 DIAGNOSIS — E875 Hyperkalemia: Secondary | ICD-10-CM | POA: Diagnosis not present

## 2022-04-22 MED ORDER — LISINOPRIL 40 MG PO TABS: 40.0000 mg | ORAL_TABLET | Freq: Every day | ORAL | 0 refills | Status: DC

## 2022-04-22 MED ORDER — MECLIZINE HCL 25 MG PO TABS: 25.0000 mg | ORAL_TABLET | Freq: Three times a day (TID) | ORAL | 0 refills | Status: DC | PRN

## 2022-04-22 MED ORDER — HYDROCHLOROTHIAZIDE 25 MG PO TABS: 25.0000 mg | ORAL_TABLET | Freq: Every day | ORAL | 0 refills | Status: DC

## 2022-04-22 NOTE — Assessment & Plan Note (Signed)
Refilled Meclizine Has ENT app August 1st Placed referral for neuro-vestibular rehab

## 2022-04-22 NOTE — Progress Notes (Signed)
    SUBJECTIVE:   CHIEF COMPLAINT / HPI:   ED Follow-up Sarah [name] is here for ED follow-up.  She was seen for dizziness/spinning sensation.  CT head and MRI brain were negative  She was diagnosed with vertigo and prescribed meclizine.  She says she still feels a little bit dizzy when standing up. She also gets a little bit of vertigo every few days. No visual changes, headaches, chest pain.  Hypertension BP 130s/70-80s at home. Taking medication as prescribed Requesting refill on HCTZ and Lisinopril  Regimen: Lisinopril 40 mg daily Amlodipine 10 mg daily Carvedilol 6.25 mg BID HCTZ 25 mg daily  PERTINENT  PMH / PSH: Reviewed  OBJECTIVE:   BP (!) 145/78   Pulse 72   Resp 18   Wt 195 lb 4 oz (88.6 kg)   SpO2 100%   BMI 33.51 kg/m    General: Alert, NAD, pleasant HEENT: Pupils PERRLA. EOMI.  CV: RRR Resp: Normal WOB on room air, no wheezes or crackles Neuro: A&O x4, speech clear and fluent. No focal neuro deficits Ext: Ambulates with cane.  ASSESSMENT/PLAN:   Vertigo Refilled Meclizine Has ENT app August 1st Placed referral for neuro-vestibular rehab  HYPERTENSION, BENIGN SYSTEMIC BP decently controlled in office 145/78. Goal <140/90 Refilled HCTZ and Lisinopril Continue: Lisinopril, Amlodipine, Carvedilol, HCTZ     Orvis Brill, DO [place]

## 2022-04-22 NOTE — Patient Instructions (Addendum)
It was great to see you! Thank you for allowing me to participate in your care!  Your imaging of your brain/head was normal in the ED.  I refilled your hydrochlorothiazide for you. I also refilled your Meclizine to help with your vertigo.  I sent a referral to the ENT doctors.   Take care and seek immediate care sooner if you develop any concerns.   Dr. Orvis Brill, [place]

## 2022-04-22 NOTE — Assessment & Plan Note (Addendum)
BP decently controlled in office 145/78. Goal <140/90 Refilled HCTZ and Lisinopril Continue: Lisinopril, Amlodipine, Carvedilol, HCTZ

## 2022-04-25 ENCOUNTER — Ambulatory Visit: Payer: Medicare HMO | Attending: Family Medicine | Admitting: Physical Therapy

## 2022-04-25 DIAGNOSIS — H8113 Benign paroxysmal vertigo, bilateral: Secondary | ICD-10-CM | POA: Insufficient documentation

## 2022-04-25 NOTE — Patient Instructions (Signed)
Rolling    With pillow under head, start on back. Roll slowly to right. Hold position until symptoms subside. Roll slowly onto left side. Hold position until symptoms subside. Repeat sequence _10___ times per session. Do __2-3__ sessions per day.  Copyright  VHI. All rights reserved.  Benign Positional Vertigo Vertigo is the feeling that you or your surroundings are moving when they are not. Benign positional vertigo is the most common form of vertigo. This is usually a harmless condition (benign). This condition is positional. This means that symptoms are triggered by certain movements and positions. This condition can be dangerous if it occurs while you are doing something that could cause harm to yourself or others. This includes activities such as driving or operating machinery. What are the causes? The inner ear has fluid-filled canals that help your brain sense movement and balance. When the fluid moves, the brain receives messages about your body's position. With benign positional vertigo, calcium crystals in the inner ear break free and disturb the inner ear area. This causes your brain to receive confusing messages about your body's position. What increases the risk? You are more likely to develop this condition if: You are a woman. You are 69 years of age or older. You have recently had a head injury. You have an inner ear disease. What are the signs or symptoms? Symptoms of this condition usually happen when you move your head or your eyes in different directions. Symptoms may start suddenly and usually last for less than a minute. They include: Loss of balance and falling. Feeling like you are spinning or moving. Feeling like your surroundings are spinning or moving. Nausea and vomiting. Blurred vision. Dizziness. Involuntary eye movement (nystagmus). Symptoms can be mild and cause only minor problems, or they can be severe and interfere with daily life. Episodes of  benign positional vertigo may return (recur) over time. Symptoms may also improve over time. How is this diagnosed? This condition may be diagnosed based on: Your medical history. A physical exam of the head, neck, and ears. Positional tests to check for or stimulate vertigo. You may be asked to turn your head and change positions, such as going from sitting to lying down. A health care provider will watch for symptoms of vertigo. You may be referred to a health care provider who specializes in ear, nose, and throat problems (ENT or otolaryngologist) or a provider who specializes in disorders of the nervous system (neurologist). How is this treated?  This condition may be treated in a session in which your health care provider moves your head in specific positions to help the displaced crystals in your inner ear move. Treatment for this condition may take several sessions. Surgery may be needed in severe cases, but this is rare. In some cases, benign positional vertigo may resolve on its own in 2-4 weeks. Follow these instructions at home: Safety Move slowly. Avoid sudden body or head movements or certain positions, as told by your health care provider. Avoid driving or operating machinery until your health care provider says it is safe. Avoid doing any tasks that would be dangerous to you or others if vertigo occurs. If you have trouble walking or keeping your balance, try using a cane for stability. If you feel dizzy or unstable, sit down right away. Return to your normal activities as told by your health care provider. Ask your health care provider what activities are safe for you. General instructions Take over-the-counter and prescription medicines  only as told by your health care provider. Drink enough fluid to keep your urine pale yellow. Keep all follow-up visits. This is important. Contact a health care provider if: You have a fever. Your condition gets worse or you develop new  symptoms. Your family or friends notice any behavioral changes. You have nausea or vomiting that gets worse. You have numbness or a prickling and tingling sensation. Get help right away if you: Have difficulty speaking or moving. Are always dizzy or faint. Develop severe headaches. Have weakness in your legs or arms. Have changes in your hearing or vision. Develop a stiff neck. Develop sensitivity to light. These symptoms may represent a serious problem that is an emergency. Do not wait to see if the symptoms will go away. Get medical help right away. Call your local emergency services (911 in the [place] Do not drive yourself to the hospital. Summary Vertigo is the feeling that you or your surroundings are moving when they are not. Benign positional vertigo is the most common form of vertigo. This condition is caused by calcium crystals in the inner ear that become displaced. This causes a disturbance in an area of the inner ear that helps your brain sense movement and balance. Symptoms include loss of balance and falling, feeling that you or your surroundings are moving, nausea and vomiting, and blurred vision. This condition can be diagnosed based on symptoms, a physical exam, and positional tests. Follow safety instructions as told by your health care provider and keep all follow-up visits. This is important. This information is not intended to replace advice given to you by your health care provider. Make sure you discuss any questions you have with your health care provider. Document Revised: 08/23/2020 Document Reviewed: 08/23/2020 Elsevier Patient Education  [place].

## 2022-04-25 NOTE — Therapy (Signed)
OUTPATIENT PHYSICAL THERAPY VESTIBULAR EVALUATION     Patient Name: Sarah [name] MRN: [id number] DOB:[birth year], 71 y.o., female Today's Date: 04/26/2022  PCP: Orvis Brill, DO (Resident) REFERRING PROVIDER: McDiarmid, Blane Ohara, MD    PT End of Session - 04/26/22 1346     Visit Number 1    Number of Visits 4    Date for PT Re-Evaluation 05/17/22    Authorization Type [employer]    Authorization Time Period 04-25-22 - 06-06-22    PT Start Time 1517    PT Stop Time 1605    PT Time Calculation (min) 48 min    Activity Tolerance Patient tolerated treatment well    Behavior During Therapy [facility] for tasks assessed/performed             Past Medical History:  Diagnosis Date   Arthritis    Hyperlipidemia    Hypertension    Polio 1964   Past Surgical History:  Procedure Laterality Date   FOOT SURGERY Left 1961   due to polio   HYSTEROSCOPY WITH D & C N/A 02/25/2018   Procedure: DILATATION AND CURETTAGE /HYSTEROSCOPY;  Surgeon: Chancy Milroy, MD;  Location: [place];  Service: Gynecology;  Laterality: N/A;   TONSILLECTOMY AND ADENOIDECTOMY     Patient Active Problem List   Diagnosis Date Noted   Vertigo 04/22/2022   Musculoskeletal strain 03/14/2022   BMI 35.0-35.9,adult 08/21/2021   Hyperkalemia 08/02/2021   History of poliomyelitis 07/16/2018   Joint pain 07/16/2018   Vitamin D deficiency 07/16/2018   Fibroids, intramural 01/02/2018   Postmenopausal bleeding 07/07/2017   Prediabetes 02/14/2015   Gait instability 07/02/2014   Health care maintenance 07/02/2014   Foot pain, left 08/01/2011   Deformity of ankle and foot, acquired 05/23/2009   HLD (hyperlipidemia) 07/27/2007   Morbid obesity ([place]) 12/04/2006   HYPERTENSION, BENIGN SYSTEMIC 12/04/2006    ONSET DATE: 03-21-22  REFERRING DIAG: R42 (ICD-10-CM) - Vertigo   THERAPY DIAG:  BPPV (benign paroxysmal positional vertigo), bilateral  Rationale for Evaluation and Treatment  Rehabilitation  SUBJECTIVE:   SUBJECTIVE STATEMENT: Pt reports she got dizzy at home - states room started spinning; called EMS and took her to ED at [facility] 03-21-22; ruled out mini stroke; was discharged after several hours and diagnosed with vertigo;  has not taken Meclizine in past 2 days Pt accompanied by: self  PERTINENT HISTORY: HTN, h/o poliomyelitis   PAIN:  Are you having pain? No  PRECAUTIONS: None  WEIGHT BEARING RESTRICTIONS No  FALLS: Has patient fallen in last 6 months? Yes. Number of falls 1; pt states she fell 3 days after ED visit due to dizziness; rolled out of bed  LIVING ENVIRONMENT: Lives with: lives with their family Lives in: House/apartment  PLOF: Independent  PATIENT GOALS :    Resolve the vertigo  OBJECTIVE:   DIAGNOSTIC FINDINGS: CT head and MRI brain were negative  She was diagnosed with vertigo and prescribed meclizine.   COGNITION: Overall cognitive status: Within functional limits for tasks assessed   Cervical ROM:  WNL's   STRENGTH: WNL's  PATIENT SURVEYS:  FOTO score  DPS 53/100; risk adjusted 65/100  VESTIBULAR ASSESSMENT   GENERAL OBSERVATION: pt is a 71 yr old lady amb. Without device independently    SYMPTOM BEHAVIOR:   Subjective history: episode started on 03-21-22; was in ED for several hours and then discharged home   Non-Vestibular symptoms:  N/A   Type of dizziness: Spinning/Vertigo   Frequency: daily  usually - has improved since onset   Duration: secs to minutes   Aggravating factors: Induced by position change: lying supine and rolling to the left   Relieving factors: head stationary and lying supine   Progression of symptoms: better      POSITIONAL TESTING: Right Dix-Hallpike: no nystagmus Left Dix-Hallpike: upbeating, left nystagmus and left horizontal nystagmus  Indicative of multicanal BPPV - Lt posterior and horizontal canalithiasis        VESTIBULAR TREATMENT:  Canalith Repositioning:   Epley Left:  Number of Reps: 4, Response to Treatment: symptoms improved, and Comment: repetitive rolling for 4 reps to Rt and Lt sides after Epley due to horizontal nystagmus occurring in Lt sidelying position and Comment: Lt horizontal canalithiasis noted at end of session  Habituation: Repetitive rolling 5-10 times to each side  PATIENT EDUCATION: Education details: patient; bppv article on etiology and rolling exercise given for habituation Person educated: Patient Education method: Explanation, Demonstration, and Handouts Education comprehension: verbalized understanding and returned demonstration   GOALS: Goals reviewed with patient? Yes    LONG TERM GOALS: Target date: 05/24/2022    Pt will have (-) Lt Dix-Hallpike test to indicate resolution of Lt BPPV posterior canalithiasis. Baseline: (+) test with nystagmus and vertigo Goal status: INITIAL  2.  Pt will have (-) Lt roll test to indicate resolution of Lt horizontal canalithiasis.  Baseline: (+) test with horizontal nystagmus Goal status: INITIAL  3.  Increase FOTO score to >/= 64/100 to indicate and demonstrate improvement in vertigo. Baseline: 54/100 DPS score Goal status: INITIAL  4.  Independent in HEP for habituation exercises prn for self treatment of BPPV.  Baseline:  Goal status: INITIAL   ASSESSMENT:  CLINICAL IMPRESSION: Patient is a 71 y.o. lady who was seen today for physical therapy evaluation and treatment for Lt BPPV.  Pt presents with (+) Lt Dix-Hallpike test and (+) Lt horizontal roll test, indicative of multi canal BPPV - both Lt posterior canalithiasis and horizontal canalithiasis.  Pt was treated with 4 reps Epley maneuver for Lt BPPV posterior canalithiasis and no symptoms reported and no nystagmus noted on 4th rep, indicative of resolution of Lt BPPV posterior canalithiasis.  Pt performed 4 reps of rolling to Rt and Lt sides for habituation with horizontal nystagmus noted.  Treatment was ended due to time  constraint and also due to pt's c/o vertigo.  Will cont to assess and treat prn.    OBJECTIVE IMPAIRMENTS decreased balance and dizziness.   ACTIVITY LIMITATIONS carrying, lifting, bending, bed mobility, and locomotion level  PARTICIPATION LIMITATIONS: meal prep, cleaning, laundry, and shopping  PERSONAL FACTORS Age, Fitness, and 1 comorbidity: HTN  are also affecting patient's functional outcome.   REHAB POTENTIAL: Good  CLINICAL DECISION MAKING: Stable/uncomplicated  EVALUATION COMPLEXITY: Low   PLAN: PT FREQUENCY: 1x/week  PT DURATION: 4 weeks  PLANNED INTERVENTIONS: Therapeutic exercises, Therapeutic activity, Neuromuscular re-education, Balance training, Gait training, Patient/Family education, Self Care, Vestibular training, and Canalith repositioning  PLAN FOR NEXT SESSION: recheck Lt horizontal roll test and Dix-Hallpike   Quentina Fronek, [place], PT 04/26/2022, 1:50 PM

## 2022-04-26 ENCOUNTER — Encounter: Payer: Self-pay | Admitting: Physical Therapy

## 2022-04-30 ENCOUNTER — Ambulatory Visit: Payer: Medicare HMO | Admitting: Physical Therapy

## 2022-04-30 DIAGNOSIS — H8113 Benign paroxysmal vertigo, bilateral: Secondary | ICD-10-CM | POA: Diagnosis not present

## 2022-04-30 NOTE — Therapy (Unsigned)
OUTPATIENT PHYSICAL THERAPY VESTIBULAR TREATMENT     Patient Name: Sarah [name] MRN: [id number] DOB:Aug 31, 1951, 71 y.o., female Today's Date: 05/01/2022  PCP: Darral Dash, DO (Resident) REFERRING PROVIDER: McDiarmid, Leighton Roach, MD    PT End of Session - 05/01/22 1801     Visit Number 2    Number of Visits 4    Date for PT Re-Evaluation 05/17/22    Authorization Type [employer]    Authorization Time Period 04-25-22 - 06-06-22    PT Start Time 1505   pt arrived early for 3:30 appt time   PT Stop Time 1533    PT Time Calculation (min) 28 min    Activity Tolerance Patient tolerated treatment well    Behavior During Therapy [facility] for tasks assessed/performed              Past Medical History:  Diagnosis Date   Arthritis    Hyperlipidemia    Hypertension    Polio 1964   Past Surgical History:  Procedure Laterality Date   FOOT SURGERY Left 1961   due to polio   HYSTEROSCOPY WITH D & C N/A 02/25/2018   Procedure: DILATATION AND CURETTAGE /HYSTEROSCOPY;  Surgeon: Hermina Staggers, MD;  Location: [facility];  Service: Gynecology;  Laterality: N/A;   TONSILLECTOMY AND ADENOIDECTOMY     Patient Active Problem List   Diagnosis Date Noted   Vertigo 04/22/2022   Musculoskeletal strain 03/14/2022   BMI 35.0-35.9,adult 08/21/2021   Hyperkalemia 08/02/2021   History of poliomyelitis 07/16/2018   Joint pain 07/16/2018   Vitamin D deficiency 07/16/2018   Fibroids, intramural 01/02/2018   Postmenopausal bleeding 07/07/2017   Prediabetes 02/14/2015   Gait instability 07/02/2014   Health care maintenance 07/02/2014   Foot pain, left 08/01/2011   Deformity of ankle and foot, acquired 05/23/2009   HLD (hyperlipidemia) 07/27/2007   Morbid obesity (HCC) 12/04/2006   HYPERTENSION, BENIGN SYSTEMIC 12/04/2006    ONSET DATE: 03-21-22  REFERRING DIAG: R42 (ICD-10-CM) - Vertigo   THERAPY DIAG:  BPPV (benign paroxysmal positional vertigo), bilateral  Rationale  for Evaluation and Treatment Rehabilitation  SUBJECTIVE:   SUBJECTIVE STATEMENT: Pt reports she felt dizzy some yesterday morning when getting out of bed, but states "it wasn't bad"; pt reports she hasn't really had any dizziness since yesterday morning when it was only mild at that time; states "not like it was"  Pt accompanied by: self  PERTINENT HISTORY: HTN, h/o poliomyelitis   PAIN:  Are you having pain? No  PRECAUTIONS: None  WEIGHT BEARING RESTRICTIONS No  FALLS: Has patient fallen in last 6 months? Yes. Number of falls 1; pt states she fell 3 days after ED visit due to dizziness; rolled out of bed  LIVING ENVIRONMENT: Lives with: lives with their family Lives in: House/apartment  PLOF: Independent  PATIENT GOALS :    Resolve the vertigo  OBJECTIVE:    VESTIBULAR ASSESSMENT   GENERAL OBSERVATION: pt is a 71 yr old lady amb. Without device independently    SYMPTOM BEHAVIOR:   Subjective history: episode started on 03-21-22; was in ED for several hours and then discharged home   Non-Vestibular symptoms:  N/A   Type of dizziness: Spinning/Vertigo   Frequency: daily usually - has improved since onset   Duration: secs to minutes   Aggravating factors: Induced by position change: lying supine and rolling to the left   Relieving factors: head stationary and lying supine   Progression of symptoms: better  POSITIONAL TESTING:     Neuro-Re-ed:   Lt Dix-Hallpike test (-) with no nystagmus and no c/o vertigo in test position  Lt horizontal roll test negative with no nystagmus and no c/o vertigo in test position   Rt Dix-Hallpike test (-) and Rt horizontal roll test (-) with no nystagmus and no c/o vertigo in either test position   Rt and Lt sidelying tests negative with no nystagmus noted and no c/o vertigo in either test position   Pt transferred sit to supine with no c/o vertigo; rolled from supine to left side and then onto Rt side with no c/o vertigo  Pt  able to look up (cervical extension) in seated position with no c/o dizziness    Self Care: Reviewed HEP consisting of habituation exercises (Brandt-Daroff) for self treatment prn should BPPV re-occur in future; reviewed Epley maneuver for self treatment as optional treatment; discussed benefits of staying well hydrated to assist in reducing re-occurrence of BPPV; pt verbalized understanding of both treatment options and etiology of BPPV     PATIENT EDUCATION: Education details: patient; bppv article on etiology and rolling exercise given for habituation Person educated: Patient Education method: Explanation, Demonstration, and Handouts Education comprehension: verbalized understanding and returned demonstration   GOALS: Goals reviewed with patient? Yes    LONG TERM GOALS: Target date: 05/24/2022    Pt will have (-) Lt Dix-Hallpike test to indicate resolution of Lt BPPV posterior canalithiasis. Baseline: (+) test with nystagmus and vertigo Goal status: Goal met 04-30-22  2.  Pt will have (-) Lt roll test to indicate resolution of Lt horizontal canalithiasis.  Baseline: (+) test with horizontal nystagmus Goal status: Goal met 04-30-22  3.  Increase FOTO score to >/= 64/100 to indicate and demonstrate improvement in vertigo. Baseline: 54/100 DPS score Goal status: Goal met 04-30-22:  score 67/100  4.  Independent in HEP for habituation exercises prn for self treatment of BPPV.  Baseline:  Goal status: Goal met 04-30-22   ASSESSMENT:  CLINICAL IMPRESSION: Patient reports no dizziness with any positional testing in today's session.  Pt now has (-) Lt Dix-Hallpike test and (-) Lt horizontal roll test indicative of resolution of Lt BPPV.  Pt has met 4/4 LTG's; FOTO score has increased from 54/100 to 67/100, demonstrating much improvement (resolution) of vertigo/BPPV.  Pt is discharged due to goals met with BPPV resolved.    OBJECTIVE IMPAIRMENTS decreased balance and dizziness.    ACTIVITY LIMITATIONS carrying, lifting, bending, bed mobility, and locomotion level  PARTICIPATION LIMITATIONS: meal prep, cleaning, laundry, and shopping  PERSONAL FACTORS Age, Fitness, and 1 comorbidity: HTN  are also affecting patient's functional outcome.   REHAB POTENTIAL: Good  CLINICAL DECISION MAKING: Stable/uncomplicated  EVALUATION COMPLEXITY: Low   PLAN: PT FREQUENCY: 1x/week  PT DURATION: 4 weeks  PLANNED INTERVENTIONS: Therapeutic exercises, Therapeutic activity, Neuromuscular re-education, Balance training, Gait training, Patient/Family education, Self Care, Vestibular training, and Canalith repositioning  PLAN FOR NEXT SESSION: recheck Lt horizontal roll test and Dix-Hallpike   PHYSICAL THERAPY DISCHARGE SUMMARY  Visits from Start of Care: 2  Current functional level related to goals / functional outcomes: See above for progress towards goals; pt reports no dizziness at this time - has resolved with treatment of Epley maneuver   Remaining deficits: None regarding vertigo   Education / Equipment: Pt has been instructed in Brandt-Daroff exercises and also in Epley maneuver for self treatment prn should BPPV re-occur in future.  Pt has also been educated in etiology of  BPPV.    Patient agrees to discharge. Patient goals were met. Patient is being discharged due to meeting the stated rehab goals.    Alda Lea, PT 05/01/2022, 6:05 PM

## 2022-05-01 ENCOUNTER — Encounter: Payer: Self-pay | Admitting: Physical Therapy

## 2022-05-06 DIAGNOSIS — R42 Dizziness and giddiness: Secondary | ICD-10-CM | POA: Diagnosis not present

## 2022-05-06 DIAGNOSIS — I10 Essential (primary) hypertension: Secondary | ICD-10-CM | POA: Diagnosis not present

## 2022-05-06 DIAGNOSIS — Z1211 Encounter for screening for malignant neoplasm of colon: Secondary | ICD-10-CM | POA: Diagnosis not present

## 2022-05-16 ENCOUNTER — Ambulatory Visit
Admission: RE | Admit: 2022-05-16 | Discharge: 2022-05-16 | Disposition: A | Discharge status: Home / Self Care | Payer: Medicare HMO | Source: Ambulatory Visit | Attending: Family Medicine | Admitting: Family Medicine

## 2022-05-16 DIAGNOSIS — Z1231 Encounter for screening mammogram for malignant neoplasm of breast: Secondary | ICD-10-CM | POA: Diagnosis not present

## 2022-06-18 DIAGNOSIS — K5730 Diverticulosis of large intestine without perforation or abscess without bleeding: Secondary | ICD-10-CM | POA: Diagnosis not present

## 2022-06-18 DIAGNOSIS — Z1211 Encounter for screening for malignant neoplasm of colon: Secondary | ICD-10-CM | POA: Diagnosis not present

## 2022-07-29 DIAGNOSIS — H9313 Tinnitus, bilateral: Secondary | ICD-10-CM | POA: Diagnosis not present

## 2022-07-29 DIAGNOSIS — H8112 Benign paroxysmal vertigo, left ear: Secondary | ICD-10-CM | POA: Diagnosis not present

## 2022-08-21 ENCOUNTER — Other Ambulatory Visit: Payer: Self-pay

## 2022-08-21 MED ORDER — LISINOPRIL 40 MG PO TABS: 40.0000 mg | ORAL_TABLET | Freq: Every day | ORAL | 0 refills | Status: DC

## 2022-09-06 ENCOUNTER — Other Ambulatory Visit: Payer: Self-pay

## 2022-09-06 DIAGNOSIS — E7849 Other hyperlipidemia: Secondary | ICD-10-CM

## 2022-09-08 MED ORDER — ATORVASTATIN CALCIUM 40 MG PO TABS: 40.0000 mg | ORAL_TABLET | Freq: Every day | ORAL | 3 refills | Status: DC

## 2022-09-09 DIAGNOSIS — H903 Sensorineural hearing loss, bilateral: Secondary | ICD-10-CM | POA: Diagnosis not present

## 2022-10-09 ENCOUNTER — Other Ambulatory Visit: Payer: Self-pay

## 2022-10-09 DIAGNOSIS — E875 Hyperkalemia: Secondary | ICD-10-CM

## 2022-10-09 DIAGNOSIS — I10 Essential (primary) hypertension: Secondary | ICD-10-CM

## 2022-10-09 MED ORDER — HYDROCHLOROTHIAZIDE 25 MG PO TABS: 25.0000 mg | ORAL_TABLET | Freq: Every day | ORAL | 0 refills | Status: DC

## 2022-10-09 MED ORDER — CARVEDILOL 6.25 MG PO TABS
6.2500 mg | ORAL_TABLET | Freq: Two times a day (BID) | ORAL | 0 refills | Status: DC
Start: 2022-10-09 — End: 2023-04-02

## 2022-11-08 IMAGING — MR MR HEAD W/O CM
12 of 13 series · 44 of 48 positions shown · non-contrast
Comparison: Prior CT from 03/21/2022.

CLINICAL DATA: Initial evaluation for acute neuro deficit, stroke
suspected.

EXAM:
MRI HEAD WITHOUT CONTRAST
TECHNIQUE: Multiplanar, multiecho pulse sequences of the brain and surrounding
structures were obtained without intravenous contrast.

[Series 5: DWI · axial · 3.0mm · 0.88mm/px · z∈[-55,+87]mm · 9 of 100 slices shown (1 of 4)]
[im 1/100]
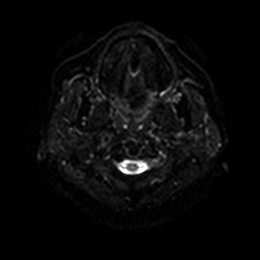
[im 13/100]
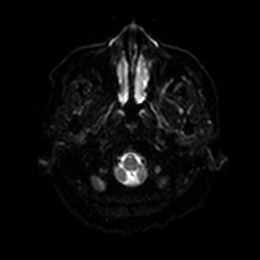
[im 25/100]
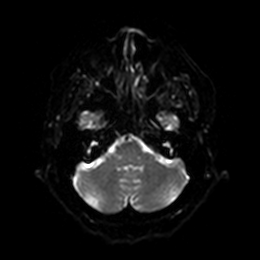
[im 38/100]
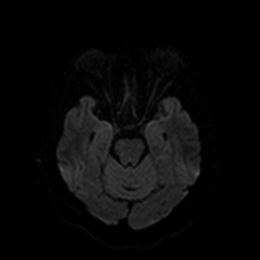
[im 50/100]
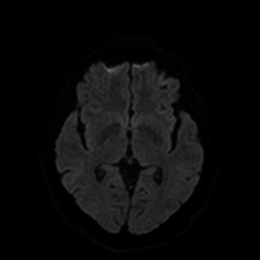
[im 62/100]
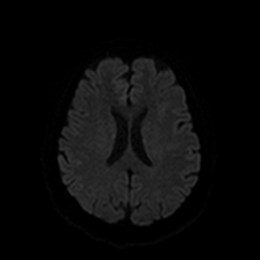
[im 75/100]
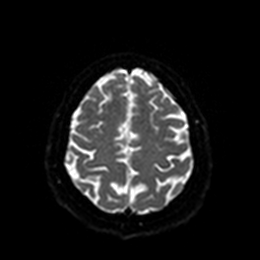
[im 87/100]
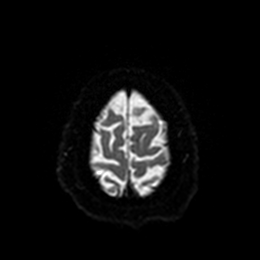
[im 100/100]
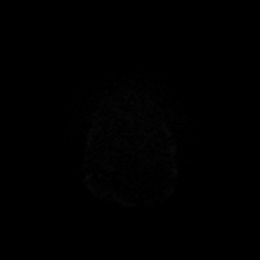

[Series 6: DWI · axial · 3.0mm · 0.88mm/px · z∈[-55,+87]mm · 4 of 50 slices shown (2 of 4)]
[im 1/50]
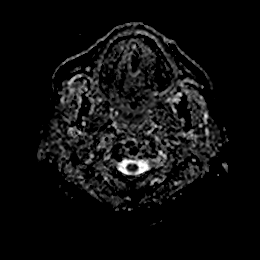
[im 17/50]
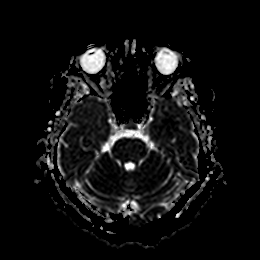
[im 33/50]
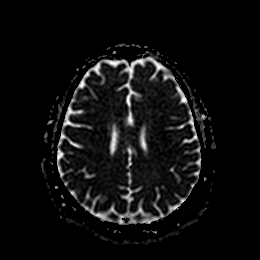
[im 50/50]
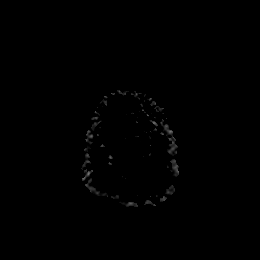

[Series 7: DWI · coronal · 4.0mm · 0.88mm/px · 5 of 64 slices shown (3 of 4)]
[im 1/64]
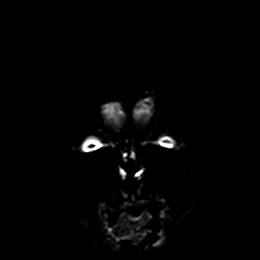
[im 16/64]
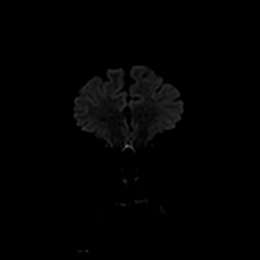
[im 32/64]
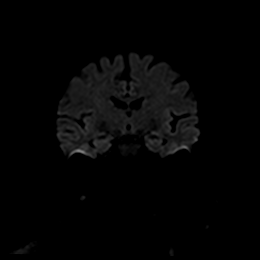
[im 48/64]
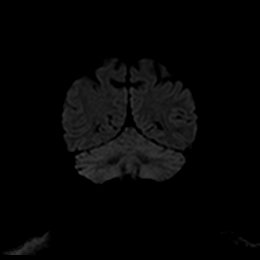
[im 64/64]
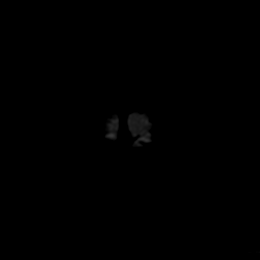

[Series 8: DWI · coronal · 4.0mm · 0.88mm/px · 2 of 32 slices shown (4 of 4)]
[im 1/32]
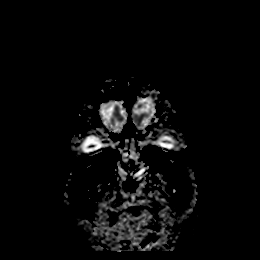
[im 32/32]
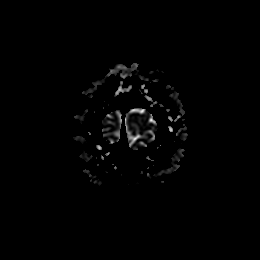

[Series 9: T1 · sagittal · 5.0mm · 0.75mm/px · 2 of 27 slices shown]
[im 1/27]
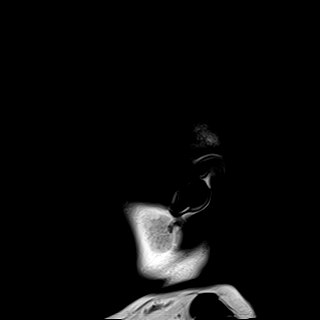
[im 27/27]
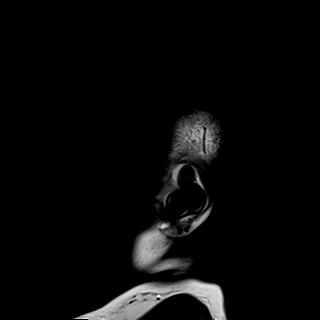

[Series 10: T2 · axial · 5.0mm · 0.72mm/px · z∈[-54,+97]mm · 2 of 27 slices shown (1 of 2)]
[im 1/27]
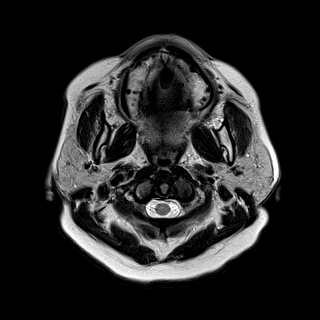
[im 27/27]
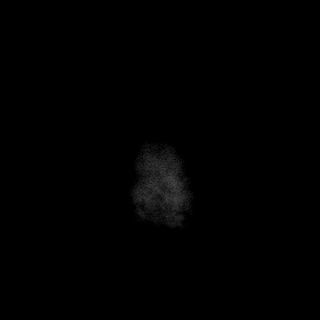

[Series 11: FLAIR · axial · 5.0mm · 0.45mm/px · z∈[-55,+96]mm · 2 of 27 slices shown]
[im 1/27]
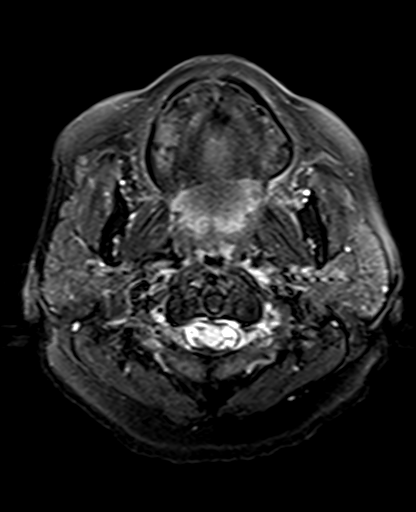
[im 27/27]
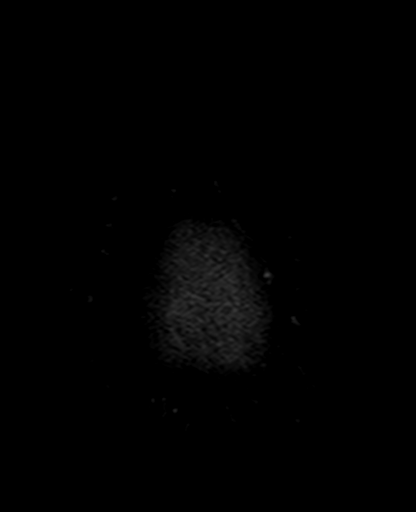

[Series 12: mag_images · axial · 3.0mm · 0.90mm/px · z∈[-62,+97]mm · 4 of 56 slices shown]
[im 1/56]
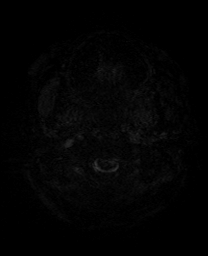
[im 19/56]
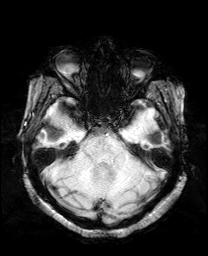
[im 37/56]
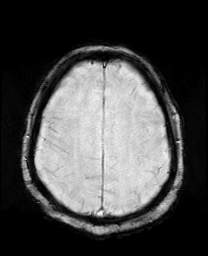
[im 56/56]
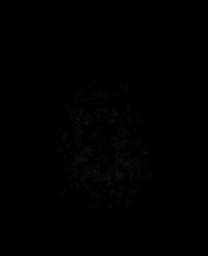

[Series 13: pha_images · axial · 3.0mm · 0.90mm/px · z∈[-62,+97]mm · 4 of 55 slices shown]
[im 1/55]
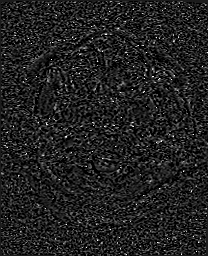
[im 19/55]
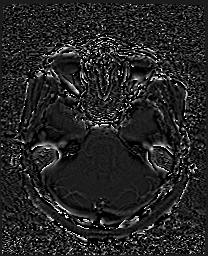
[im 37/55]
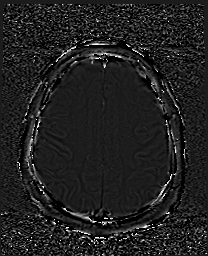
[im 55/55]
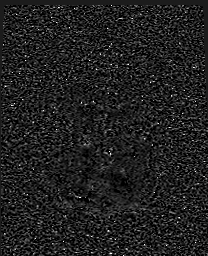

[Series 14: swi_images · axial · 3.0mm · 0.90mm/px · z∈[-62,+97]mm · 4 of 56 slices shown]
[im 1/56]
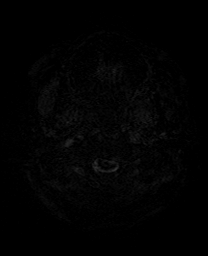
[im 19/56]
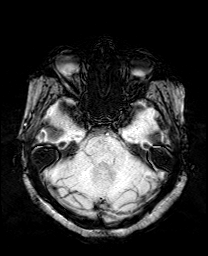
[im 37/56]
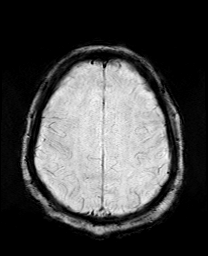
[im 56/56]
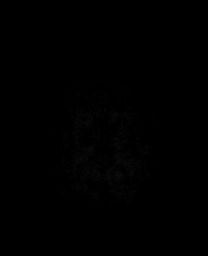

[Series 15: mip_images(sw) · axial · 24.0mm · 0.90mm/px · z∈[-52,+87]mm · 4 of 49 slices shown]
[im 1/49]
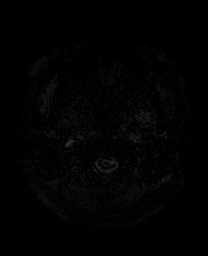
[im 17/49]
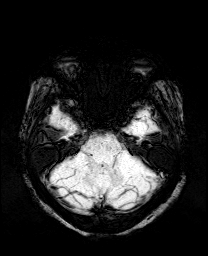
[im 33/49]
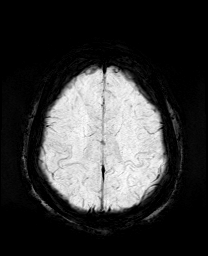
[im 49/49]
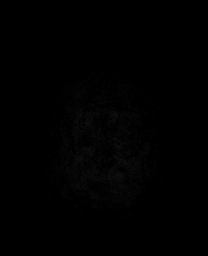

[Series 17: T2 · coronal · 5.0mm · 0.34mm/px · 2 of 30 slices shown (2 of 2)]
[im 1/30]
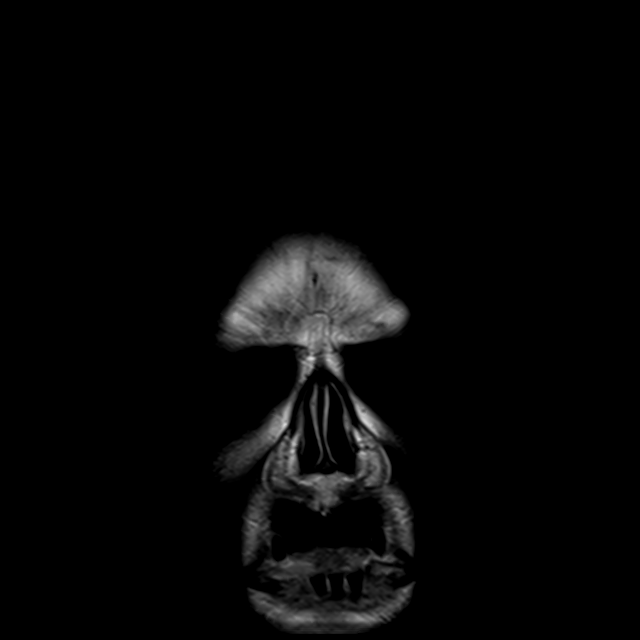
[im 30/30]
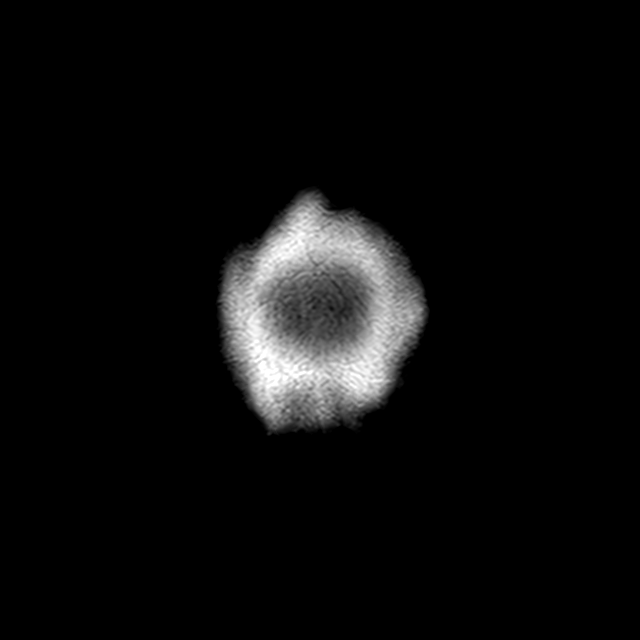

[44 of 48 positions shown; findings below may reference images not displayed]

FINDINGS: Brain: Cerebral volume within normal limits. No significant cerebral
white matter disease for age. No evidence for acute or subacute
infarct. Gray-white matter differentiation maintained. No areas of
chronic cortical infarction. No acute or chronic intracranial blood
products.

No mass lesion, midline shift or mass effect. No hydrocephalus or
extra-axial fluid collection. Pituitary gland suprasellar region
normal.

Vascular: Major intracranial vascular flow voids are maintained.

Skull and upper cervical spine: Craniocervical junction normal.
Heterogeneous marrow signal intensity seen within the visualized
upper cervical spine, suspected to be due to a combination of
degenerative changes and patient history of osteoporosis. No scalp
soft tissue abnormality.

Sinuses/Orbits: Globes and orbital soft tissues within normal
limits. Paranasal sinuses are largely clear. No significant mastoid
effusion.

Other: None.
IMPRESSION: Normal brain MRI for age. No acute intracranial abnormality
identified.

## 2022-11-16 ENCOUNTER — Other Ambulatory Visit: Payer: Self-pay | Admitting: Student

## 2022-11-25 ENCOUNTER — Other Ambulatory Visit: Payer: Self-pay

## 2022-12-05 ENCOUNTER — Other Ambulatory Visit: Payer: Self-pay

## 2022-12-06 MED ORDER — AMLODIPINE BESYLATE 10 MG PO TABS: 10.0000 mg | ORAL_TABLET | Freq: Every day | ORAL | 0 refills | Status: DC

## 2022-12-13 ENCOUNTER — Ambulatory Visit: Payer: Medicare HMO | Admitting: Podiatry

## 2022-12-13 ENCOUNTER — Encounter: Payer: Self-pay | Admitting: Podiatry

## 2022-12-13 DIAGNOSIS — M79674 Pain in right toe(s): Secondary | ICD-10-CM

## 2022-12-13 DIAGNOSIS — B351 Tinea unguium: Secondary | ICD-10-CM | POA: Diagnosis not present

## 2022-12-13 DIAGNOSIS — L84 Corns and callosities: Secondary | ICD-10-CM

## 2022-12-13 DIAGNOSIS — M79675 Pain in left toe(s): Secondary | ICD-10-CM | POA: Diagnosis not present

## 2022-12-13 HISTORY — DX: Corns and callosities: L84

## 2022-12-13 NOTE — Progress Notes (Signed)
This patient presents to the office with chief complaint of painful left foot.  Patient has developed painful calluses left foot.  She has history of polio and inverted left foot.  This causes her to walk on the outside of her left foot.  She previously had surgery by Dr.  March Rummage.  She presents to the office for evaluation and treatment.  Vascular  Dorsalis pedis and posterior tibial pulses are palpable  B/L.  Capillary return  WNL.  Temperature gradient is  WNL.  Skin turgor  WNL  Sensorium  Senn Weinstein monofilament wire  WNL. Normal tactile sensation.  Nail Exam  Patient has normal nails with no evidence of bacterial or fungal infection.  Orthopedic  Exam  Muscle tone and muscle strength  WNL.  No limitations of motion feet  B/L.  No crepitus or joint effusion noted.  She has inverted left foot s/p polio.  Skin  No open lesions.  Normal skin texture and turgor.   Callus noted sub 5th met, sub fift  Callus left foot  ROV.  Debride callus with dremel tool.  Patient was told to RTC  3 months for callus care.    Gardiner Barefoot DPM ot left callus.

## 2022-12-24 ENCOUNTER — Telehealth: Payer: Self-pay | Admitting: Student

## 2022-12-24 NOTE — Telephone Encounter (Signed)
Called patient to schedule Medicare Annual Wellness Visit (AWV). Left message for patient to call back and schedule Medicare Annual Wellness Visit (AWV).  Last date of AWV: 04/25/2020   Please schedule an AWVS appointment at any time with [place].  If any questions, please contact me at [phone].    Thank you,  [place] Direct dial  [phone]

## 2023-01-02 ENCOUNTER — Telehealth: Payer: Self-pay | Admitting: Student

## 2023-01-02 NOTE — Telephone Encounter (Signed)
[place] to schedule their annual wellness visit. Appointment made for 01/10/2023 at 8:30AM.  Last AWV: 04/25/2020  [place]

## 2023-01-09 NOTE — Patient Instructions (Signed)

## 2023-01-09 NOTE — Progress Notes (Signed)
  I attempted to contact the patient for her scheduled Virtual Telephone Annual Wellness Visit. No answer. I left a detailed message on the patient's voicemail.   

## 2023-01-10 ENCOUNTER — Ambulatory Visit (INDEPENDENT_AMBULATORY_CARE_PROVIDER_SITE_OTHER): Payer: Medicare HMO

## 2023-01-10 DIAGNOSIS — Z0000 Encounter for general adult medical examination without abnormal findings: Secondary | ICD-10-CM

## 2023-01-21 ENCOUNTER — Ambulatory Visit (INDEPENDENT_AMBULATORY_CARE_PROVIDER_SITE_OTHER): Payer: Medicare HMO

## 2023-01-21 NOTE — Patient Instructions (Signed)

## 2023-01-21 NOTE — Progress Notes (Signed)
  I attempted to contact the patient for her scheduled Virtual Telephone Annual Wellness Visit. No answer. I left a detailed message on the patient's voicemail.   

## 2023-02-02 NOTE — Progress Notes (Unsigned)
I connected with  [name] on 02/03/2023  by a audio enabled telemedicine application and verified that I am speaking with the correct person using two identifiers.  Patient Location: Home  Provider Location: Home Office  I discussed the limitations of evaluation and management by telemedicine. The patient expressed understanding and agreed to proceed.   Subjective:   BRANDILEE PIES is a 72 y.o. female who presents for Medicare Annual (Subsequent) preventive examination.  Review of Systems    Per HPI unless specifically indicated below. Cardiac Risk Factors include: advanced age (>74men, >50 women);female gender, Hypertension, and hyperlipidemia.        Objective:       04/22/2022    1:52 PM 03/22/2022    1:41 AM 03/21/2022   11:30 PM  Vitals with BMI  Weight 195 lbs 4 oz    Systolic 145 115 811  Diastolic 78 76 75  Pulse 72 70 63    There were no vitals filed for this visit. There is no height or weight on file to calculate BMI.     04/26/2022    1:46 PM 04/22/2022    1:51 PM 03/21/2022   11:11 AM 02/26/2022    3:30 PM 08/21/2021    8:52 AM 08/02/2021    8:59 AM 07/05/2021   11:10 AM  Advanced Directives  Does Patient Have a Medical Advance Directive? No No No No No No No  Does patient want to make changes to medical advance directive?   No - Patient declined      Would patient like information on creating a medical advance directive? No - Patient declined No - Patient declined  No - Patient declined No - Patient declined No - Patient declined No - Patient declined    Current Medications (verified) Outpatient Encounter Medications as of 02/03/2023  Medication Sig   amLODipine (NORVASC) 10 MG tablet Take 1 tablet (10 mg total) by mouth daily.   aspirin 81 MG EC tablet Take 81 mg by mouth daily.     atorvastatin (LIPITOR) 40 MG tablet Take 1 tablet (40 mg total) by mouth daily.   carvedilol (COREG) 6.25 MG tablet Take 1 tablet (6.25 mg total) by mouth 2 (two) times  daily with a meal.   hydrochlorothiazide (HYDRODIURIL) 25 MG tablet Take 1 tablet (25 mg total) by mouth daily.   lidocaine (LIDODERM) 5 % Place 1 patch onto the skin daily. Remove & Discard patch within 12 hours or as directed by MD   lisinopril (ZESTRIL) 40 MG tablet Take 1 tablet (40 mg total) by mouth daily.   meclizine (ANTIVERT) 25 MG tablet TAKE 1 TABLET BY MOUTH THREE TIMES DAILY AS NEEDED FOR DIZZINESS   Zoster Vaccine Adjuvanted [facility]) injection Shingrix (PF) 50 mcg/0.5 mL intramuscular suspension, kit   No facility-administered encounter medications on file as of 02/03/2023.    Allergies (verified) Penicillin g and Penicillins   History: Past Medical History:  Diagnosis Date   Arthritis    Hyperlipidemia    Hypertension    Polio 1964   Past Surgical History:  Procedure Laterality Date   FOOT SURGERY Left 1961   due to polio   HYSTEROSCOPY WITH D & C N/A 02/25/2018   Procedure: DILATATION AND CURETTAGE /HYSTEROSCOPY;  Surgeon: Hermina Staggers, MD;  Location: [facility];  Service: Gynecology;  Laterality: N/A;   TONSILLECTOMY AND ADENOIDECTOMY     Family History  Problem Relation Age of Onset   Hypertension Mother  Stroke Mother    Cancer Father 86       Unknown   Stroke Sister        multiple   Hypertension Sister    Kidney disease Sister    Diabetes Brother    Hypertension Sister    Hypertension Brother    Kidney disease Son    Diabetes Son    Hypertension Daughter    Social History   Socioeconomic History   Marital status: Widowed    Spouse name: Not on file   Number of children: 6   Years of education: 38   Highest education level: 12th grade  Occupational History   Occupation: Child Care   Tobacco Use   Smoking status: Never   Smokeless tobacco: Never   Tobacco comments:    No plans to start  Vaping Use   Vaping Use: Never used  Substance and Sexual Activity   Alcohol use: No   Drug use: No   Sexual activity: Not  Currently  Other Topics Concern   Not on file  Social History Narrative   Patient lives in [place] with one of her adopted children.    Patient has 3 birthed children and 3 adopted.   Patient is a widow.   Patient has a lot of stress at home. 1 son in jail and 1 daughter who runs away and hangs with "wrong crowd."   Patient enjoys crocheting, sewing, gardening, puzzles, and playing games with her family.                                                                                                             Social Determinants of Health   Financial Resource Strain: Low Risk  (04/25/2020)   Overall Financial Resource Strain (CARDIA)    Difficulty of Paying Living Expenses: Not hard at all  Food Insecurity: No Food Insecurity (04/25/2020)   Hunger Vital Sign    Worried About Running Out of Food in the Last Year: Never true    Ran Out of Food in the Last Year: Never true  Transportation Needs: Unmet Transportation Needs (04/25/2020)   PRAPARE - Transportation    Lack of Transportation (Medical): Yes    Lack of Transportation (Non-Medical): Yes  Physical Activity: Inactive (04/25/2020)   Exercise Vital Sign    Days of Exercise per Week: 0 days    Minutes of Exercise per Session: 0 min  Stress: Stress Concern Present (04/25/2020)   [employer] of Occupational Health - Occupational Stress Questionnaire    Feeling of Stress : Rather much  Social Connections: Moderately Integrated (04/25/2020)   Social Connection and Isolation Panel [NHANES]    Frequency of Communication with Friends and Family: More than three times a week    Frequency of Social Gatherings with Friends and Family: More than three times a week    Attends Religious Services: More than 4 times per year    Active Member of [employer] or Organizations: No    Attends Banker Meetings: Never  Marital Status: Married    Tobacco Counseling Counseling given: Not Answered Tobacco comments: No plans to  start   Clinical Intake:                 Diabetic? No        Activities of Daily Living     No data to display           Patient Care Team: Darral Dash, DO as PCP - General (Family Medicine) Park Liter, DPM (Inactive) as Consulting Physician (Podiatry)  Indicate any recent Medical Services you may have received from other than [facility] providers in the past year (date may be approximate).     Assessment:   This is a routine wellness examination for Seraya.   Hearing/Vision screen Denies any hearing issues. Denies any change to her vision. Wear glasses. Annual Eye Exam.  Dietary issues and exercise activities discussed:     Goals Addressed   None   Depression Screen    04/22/2022    1:52 PM 03/13/2022    4:16 PM 02/26/2022    3:29 PM 12/17/2021    3:12 PM 08/21/2021    8:52 AM 08/02/2021    8:59 AM 07/05/2021   11:10 AM  PHQ 2/9 Scores  PHQ - 2 Score 1 0 0 0 0 0 0  PHQ- 9 Score 2 0 0 0 0 1 0    Fall Risk    04/22/2022    1:51 PM 02/26/2022    3:29 PM 08/21/2021    8:51 AM 07/05/2021   11:10 AM 04/25/2020    9:28 AM  Fall Risk   Falls in the past year? 1 0 0 0 0  Number falls in past yr: 0 0 0 0   Injury with Fall? 1 0 0 0     FALL RISK PREVENTION PERTAINING TO THE HOME:  Any stairs in or around the home? {YES/NO:21197} If so, are there any without handrails? {YES/NO:21197} Home free of loose throw rugs in walkways, pet beds, electrical cords, etc? {YES/NO:21197} Adequate lighting in your home to reduce risk of falls? {YES/NO:21197}  ASSISTIVE DEVICES UTILIZED TO PREVENT FALLS:  Life alert? {YES/NO:21197} Use of a cane, walker or w/c? {YES/NO:21197} Grab bars in the bathroom? {YES/NO:21197} Shower chair or bench in shower? {YES/NO:21197} Elevated toilet seat or a handicapped toilet? {YES/NO:21197}  TIMED UP AND GO:  Was the test performed? Unable to perform, virtual appointment  Cognitive Function:    07/28/2018    9:32 AM   MMSE - Mini Mental State Exam  Orientation to time 5  Orientation to Place 5  Registration 3  Attention/ Calculation 5  Recall 3  Language- name 2 objects 2  Language- repeat 1  Language- follow 3 step command 3  Language- read & follow direction 1  Write a sentence 1  Copy design 1  Total score 30        04/25/2020    9:30 AM 07/28/2018    9:33 AM  6CIT Screen  What Year? 0 points 0 points  What month? 0 points 0 points  What time? 0 points 0 points  Count back from 20 0 points 0 points  Months in reverse 0 points 0 points  Repeat phrase 0 points 0 points  Total Score 0 points 0 points    Immunizations Immunization History  Administered Date(s) Administered   Fluad Quad(high Dose 65+) 07/05/2021   Influenza Whole 07/27/2007   PFIZER(Purple Top)SARS-COV-2 Vaccination 12/02/2019, 12/30/2019  Research officer, trade [employer] 40yrs & up 08/02/2021   Pneumococcal Conjugate-13 04/16/2018   Pneumococcal Polysaccharide-23 06/08/2020   Td 02/04/1990   Tdap 01/31/2016   Zoster Recombinat (Shingrix) 05/21/2017   Zoster, Live 01/31/2016    TDAP status: Up to date  Flu Vaccine status: Up to date  Pneumococcal vaccine status: Up to date  Covid-19 vaccine status: Information provided on how to obtain vaccines.   Qualifies for Shingles Vaccine? Yes   Zostavax completed No   Shingrix Completed?: No.    Education has been provided regarding the importance of this vaccine. Patient has been advised to call insurance company to determine out of pocket expense if they have not yet received this vaccine. Advised may also receive vaccine at local pharmacy or Health Dept. Verbalized acceptance and understanding.  Screening Tests Health Maintenance  Topic Date Due   Zoster Vaccines- Shingrix (2 of 2) 07/16/2017   COVID-19 Vaccine (4 - 2023-24 season) 06/07/2022   INFLUENZA VACCINE  05/08/2023   Medicare Annual Wellness (AWV)  02/03/2024   MAMMOGRAM  05/16/2024    DTaP/Tdap/Td (3 - Td or Tdap) 01/30/2026   COLONOSCOPY (Pts 45-30yrs Insurance coverage will need to be confirmed)  06/20/2032   Pneumonia Vaccine 103+ Years old  Completed   DEXA SCAN  Completed   Hepatitis C Screening  Completed   HPV VACCINES  Aged Out    Health Maintenance  Health Maintenance Due  Topic Date Due   Zoster Vaccines- Shingrix (2 of 2) 07/16/2017   COVID-19 Vaccine (4 - 2023-24 season) 06/07/2022    Colorectal cancer screening: Type of screening: Colonoscopy. Completed 01/31/2016. Repeat every 06/20/2032 years  Mammogram status: Completed 05/16/2022. Repeat every year  DEXA Scan:06/03/2018  Lung Cancer Screening: (Low Dose CT Chest recommended if Age 52-80 years, 30 pack-year currently smoking OR have quit w/in 15years.) does not qualify.   Lung Cancer Screening Referral: not applicable  Additional Screening:  Hepatitis C Screening: does qualify; Completed 01/30/2016  Vision Screening: Recommended annual ophthalmology exams for early detection of glaucoma and other disorders of the eye. Is the patient up to date with their annual eye exam?  No  Who is the provider or what is the name of the office in which the patient attends annual eye exams? *** If pt is not established with a provider, would they like to be referred to a provider to establish care? No .   Dental Screening: Recommended annual dental exams for proper oral hygiene  Community Resource Referral / Chronic Care Management: CRR required this visit?  No   CCM required this visit?  No      Plan:     I have personally reviewed and noted the following in the patient's chart:   Medical and social history Use of alcohol, tobacco or illicit drugs  Current medications and supplements including opioid prescriptions. Patient is not currently taking opioid prescriptions. Functional ability and status Nutritional status Physical activity Advanced directives List of other  physicians Hospitalizations, surgeries, and ER visits in previous 12 months Vitals Screenings to include cognitive, depression, and falls Referrals and appointments  In addition, I have reviewed and discussed with patient certain preventive protocols, quality metrics, and best practice recommendations. A written personalized care plan for preventive services as well as general preventive health recommendations were provided to patient.     Lonna Cobb, CMA   02/03/2023  Nurse Notes: Approximately 30 minute Non-Face -To-Face Medicare Wellness Visit

## 2023-02-03 ENCOUNTER — Ambulatory Visit (INDEPENDENT_AMBULATORY_CARE_PROVIDER_SITE_OTHER): Payer: Medicare HMO

## 2023-02-03 DIAGNOSIS — Z0000 Encounter for general adult medical examination without abnormal findings: Secondary | ICD-10-CM

## 2023-02-18 ENCOUNTER — Other Ambulatory Visit: Payer: Self-pay

## 2023-02-18 MED ORDER — LISINOPRIL 40 MG PO TABS
40.0000 mg | ORAL_TABLET | Freq: Every day | ORAL | 0 refills | Status: DC
Start: 2023-02-18 — End: 2023-03-20

## 2023-02-24 ENCOUNTER — Other Ambulatory Visit: Payer: Self-pay | Admitting: Student

## 2023-02-24 DIAGNOSIS — E875 Hyperkalemia: Secondary | ICD-10-CM

## 2023-02-24 DIAGNOSIS — I10 Essential (primary) hypertension: Secondary | ICD-10-CM

## 2023-03-02 ENCOUNTER — Other Ambulatory Visit: Payer: Self-pay | Admitting: Student

## 2023-03-19 ENCOUNTER — Other Ambulatory Visit: Payer: Self-pay | Admitting: Student

## 2023-03-19 ENCOUNTER — Ambulatory Visit: Payer: Medicare HMO | Admitting: Podiatry

## 2023-03-19 ENCOUNTER — Encounter: Payer: Self-pay | Admitting: Podiatry

## 2023-03-19 DIAGNOSIS — L84 Corns and callosities: Secondary | ICD-10-CM | POA: Diagnosis not present

## 2023-03-19 DIAGNOSIS — R7303 Prediabetes: Secondary | ICD-10-CM | POA: Diagnosis not present

## 2023-03-19 NOTE — Progress Notes (Signed)
This patient presents to the office with chief complaint of painful left foot.  Patient has developed painful calluses left foot.  She has history of polio and inverted left foot.  This causes her to walk on the outside of her left foot.  She previously had surgery by Dr.  Samuella Cota.  She presents to the office for evaluation and treatment.  Vascular  Dorsalis pedis and posterior tibial pulses are palpable  B/L.  Capillary return  WNL.  Temperature gradient is  WNL.  Skin turgor  WNL  Sensorium  Senn Weinstein monofilament wire  WNL. Normal tactile sensation.  Nail Exam  Patient has normal nails with no evidence of bacterial or fungal infection.  Orthopedic  Exam  Muscle tone and muscle strength  WNL.  No limitations of motion feet  B/L.  No crepitus or joint effusion noted.  She has inverted left foot s/p polio.  Skin  No open lesions.  Normal skin texture and turgor.   Callus noted sub 5th met, sub fifth metabase.  Callus left foot  ROV.  Debride callus with dremel tool.  Patient was told to RTC  3 months for callus care.    Helane Gunther DPM ot left callus.

## 2023-04-02 ENCOUNTER — Ambulatory Visit (INDEPENDENT_AMBULATORY_CARE_PROVIDER_SITE_OTHER): Payer: Medicare HMO | Admitting: Student

## 2023-04-02 ENCOUNTER — Encounter: Payer: Self-pay | Admitting: Student

## 2023-04-02 VITALS — BP 177/96 | HR 68 | Ht 64.0 in | Wt 198.2 lb

## 2023-04-02 DIAGNOSIS — I10 Essential (primary) hypertension: Secondary | ICD-10-CM

## 2023-04-02 DIAGNOSIS — H538 Other visual disturbances: Secondary | ICD-10-CM

## 2023-04-02 DIAGNOSIS — Z111 Encounter for screening for respiratory tuberculosis: Secondary | ICD-10-CM | POA: Insufficient documentation

## 2023-04-02 DIAGNOSIS — R7303 Prediabetes: Secondary | ICD-10-CM | POA: Diagnosis not present

## 2023-04-02 DIAGNOSIS — Z1231 Encounter for screening mammogram for malignant neoplasm of breast: Secondary | ICD-10-CM

## 2023-04-02 DIAGNOSIS — E7849 Other hyperlipidemia: Secondary | ICD-10-CM

## 2023-04-02 MED ORDER — AMLODIPINE-VALSARTAN-HCTZ 10-160-25 MG PO TABS: 1.0000 | ORAL_TABLET | Freq: Every day | ORAL | 3 refills | Status: DC

## 2023-04-02 MED ORDER — CARVEDILOL 6.25 MG PO TABS
6.2500 mg | ORAL_TABLET | Freq: Two times a day (BID) | ORAL | 0 refills | Status: DC
Start: 2023-04-02 — End: 2023-05-05

## 2023-04-02 NOTE — Progress Notes (Signed)
    SUBJECTIVE:   CHIEF COMPLAINT / HPI:   Sarah [name] is a 72 year old female here for PPD skin test and follow-up of chronic conditions listed below.  Last seen at [facility] clinic 04/2022 for vertigo. 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  Hypertension Takes lisinopril 40 mg daily, amlodipine 10 mg daily, Coreg 6.25 mg twice daily, HCTZ 25 mg daily Has not taken meds today yet. Denies chest pain, shortness of breath, headaches or vision changes.  Need for TB screening Patient desires PPD skin test instead of quant gold.  Reports of blurry vision She says that sometimes today show get blurry vision.  Does not have any dizziness or syncopal episodes. Does not wear corrective lenses.  Has not had an eye exam in some time. Does not have type 2 diabetes.  PERTINENT  PMH / PSH: HTN, HLD,   OBJECTIVE:   BP (!) 177/96   Pulse 68   Ht 5\' 4"  (1.626 m)   Wt 198 lb 3.2 oz (89.9 kg)   SpO2 97%   BMI 34.02 kg/m    General: Alert and cooperative and appears to be in no acute distress HEENT: Pupils PERRLA Cardio: Normal S1 and S2, no S3 or S4. Rhythm is regular. No murmurs or rubs.   Pulm: Clear to auscultation bilaterally, no crackles, wheezing, or diminished breath sounds. Normal respiratory effort Abdomen: Bowel sounds normal. Abdomen soft and non-tender.  Extremities: No peripheral edema. Warm/ well perfused.  Strong radial pulses. Neuro: Cranial nerves grossly intact   ASSESSMENT/PLAN:   HYPERTENSION, BENIGN SYSTEMIC BP x 2 not within goal.  Likely at elevated because she has not taken her medications yet today.  But also blood pressures at home have not been within her goal and are typically in the 140s over 90s. RN visit in 2 weeks, advised patient to bring blood pressure cuff with her to visit -To reduce pill burden, prescribed combination antihypertensive amlodipine-valsartan-HCTZ -Continue carvedilol 6.25 mg twice daily -Follow-up in 2 weeks for blood pressure check and  if still elevated, can increase valsartan dose in combination pill -Sent referral to ophthalmology for eye exam and discussed possibility of hypertensive retinopathy with patient.  Encouraged use artificial tears as well. -BMP today  HLD (hyperlipidemia) Fasting lipid panel today -Continue atorvastatin 40 mg daily  Tuberculosis screening RN administered PPD tuberculin skin test today Patient to return on Friday 6/28 for reading     Darral Dash, DO [facility]

## 2023-04-02 NOTE — Patient Instructions (Addendum)
It was great seeing you today.  As we discussed, -I sent in a new blood pressure pill that combines 3 of your medicines into 1 pill.  When you pick up this new medicine, stop taking your current supply of amlodipine, lisinopril, hydrochlorothiazide.  -Take amlodipine-valsartan-hydrochlorothiazide once daily  -Take carvedilol twice daily -Please return on Friday 6/28 for your PPD skin test check -I sent a referral to the ophthalmologist for an eye exam.  You may also use over-the-counter artificial teardrops (good brands include Systane, GenTeal).  Visine is not the best eyedrop choice.  Please schedule a nurse visit in 2 weeks for a blood pressure check.  We collected blood work today.  I will call you if anything is abnormal.  If not, I will send you a MyChart message.  If you have any questions or concerns, please feel free to call the clinic.   Have a wonderful day,  Dr. Darral Dash [facility] [phone]

## 2023-04-02 NOTE — Assessment & Plan Note (Signed)
BP x 2 not within goal.  Likely at elevated because she has not taken her medications yet today.  But also blood pressures at home have not been within her goal and are typically in the 140s over 90s. RN visit in 2 weeks, advised patient to bring blood pressure cuff with her to visit -To reduce pill burden, prescribed combination antihypertensive amlodipine-valsartan-HCTZ -Continue carvedilol 6.25 mg twice daily -Follow-up in 2 weeks for blood pressure check and if still elevated, can increase valsartan dose in combination pill -Sent referral to ophthalmology for eye exam and discussed possibility of hypertensive retinopathy with patient.  Encouraged use artificial tears as well. -BMP today

## 2023-04-02 NOTE — Addendum Note (Signed)
Addended by: Darral Dash on: 04/02/2023 10:56 AM   Modules accepted: Orders

## 2023-04-02 NOTE — Assessment & Plan Note (Signed)
RN administered PPD tuberculin skin test today Patient to return on Friday 6/28 for reading

## 2023-04-02 NOTE — Progress Notes (Signed)
PPD placed in left forearm. Patient to return on 6/28 for skin test reading.  Paperwork is at my desk in RN room to be signed off on.

## 2023-04-02 NOTE — Assessment & Plan Note (Addendum)
Fasting lipid panel today -Continue atorvastatin 40 mg daily

## 2023-04-03 LAB — HEMOGLOBIN A1C
Est. average glucose Bld gHb Est-mCnc: 131 mg/dL
Hgb A1c MFr Bld: 6.2 % — ABNORMAL HIGH (ref 4.8–5.6)

## 2023-04-03 LAB — BASIC METABOLIC PANEL
BUN/Creatinine Ratio: 15 (ref 12–28)
BUN: 13 mg/dL (ref 8–27)
CO2: 25 mmol/L (ref 20–29)
Calcium: 9.4 mg/dL (ref 8.7–10.3)
Chloride: 101 mmol/L (ref 96–106)
Creatinine, Ser: 0.85 mg/dL (ref 0.57–1.00)
Glucose: 127 mg/dL — ABNORMAL HIGH (ref 70–99)
Potassium: 4.2 mmol/L (ref 3.5–5.2)
Sodium: 141 mmol/L (ref 134–144)
eGFR: 74 mL/min/{1.73_m2} (ref >59)

## 2023-04-03 LAB — LIPID PANEL
Chol/HDL Ratio: 3.8 ratio (ref 0.0–4.4)
Cholesterol, Total: 150 mg/dL (ref 100–199)
HDL: 39 mg/dL — ABNORMAL LOW (ref >39)
LDL Chol Calc (NIH): 90 mg/dL (ref 0–99)
Triglycerides: 113 mg/dL (ref 0–149)
VLDL Cholesterol Cal: 21 mg/dL (ref 5–40)

## 2023-04-04 ENCOUNTER — Other Ambulatory Visit (HOSPITAL_COMMUNITY): Payer: Self-pay

## 2023-04-04 ENCOUNTER — Ambulatory Visit: Payer: Medicare HMO

## 2023-04-04 DIAGNOSIS — Z111 Encounter for screening for respiratory tuberculosis: Secondary | ICD-10-CM

## 2023-04-04 LAB — TB SKIN TEST
Induration: 0 mm
TB Skin Test: NEGATIVE

## 2023-04-04 NOTE — Progress Notes (Signed)
PPD Reading Note PPD read and results entered in EpicCare. Result: 0 mm induration. Interpretation: Negative Allergic reaction: No  

## 2023-04-16 ENCOUNTER — Ambulatory Visit: Payer: Self-pay

## 2023-05-02 ENCOUNTER — Other Ambulatory Visit: Payer: Self-pay | Admitting: Student

## 2023-05-02 DIAGNOSIS — I10 Essential (primary) hypertension: Secondary | ICD-10-CM

## 2023-05-02 DIAGNOSIS — E875 Hyperkalemia: Secondary | ICD-10-CM

## 2023-05-20 ENCOUNTER — Ambulatory Visit
Admission: RE | Admit: 2023-05-20 | Discharge: 2023-05-20 | Disposition: A | Discharge status: Home / Self Care | Payer: Medicare HMO | Source: Ambulatory Visit | Attending: Family Medicine | Admitting: Family Medicine

## 2023-05-20 DIAGNOSIS — Z1231 Encounter for screening mammogram for malignant neoplasm of breast: Secondary | ICD-10-CM | POA: Diagnosis not present

## 2023-05-30 ENCOUNTER — Ambulatory Visit: Payer: Medicare HMO

## 2023-05-30 VITALS — Ht 64.0 in | Wt 198.0 lb

## 2023-05-30 DIAGNOSIS — Z0000 Encounter for general adult medical examination without abnormal findings: Secondary | ICD-10-CM

## 2023-05-30 NOTE — Patient Instructions (Signed)
Sarah [name] , Thank you for taking time to come for your Medicare Wellness Visit. I appreciate your ongoing commitment to your health goals. Please review the following plan we discussed and let me know if I can assist you in the future.   Referrals/Orders/Follow-Ups/Clinician Recommendations: Aim for 30 minutes of exercise or brisk walking, 6-8 glasses of water, and 5 servings of fruits and vegetables each day.  This is a list of the screening recommended for you and due dates:  Health Maintenance  Topic Date Due   Zoster (Shingles) Vaccine (2 of 2) 07/16/2017   COVID-19 Vaccine (4 - 2023-24 season) 06/07/2022   Flu Shot  05/08/2023   Medicare Annual Wellness Visit  05/29/2024   Mammogram  05/19/2025   DTaP/Tdap/Td vaccine (3 - Td or Tdap) 01/30/2026   Colon Cancer Screening  06/20/2032   Pneumonia Vaccine  Completed   DEXA scan (bone density measurement)  Completed   Hepatitis C Screening  Completed   HPV Vaccine  Aged Out    Advanced directives: (ACP Link)Information on Advanced Care Planning can be found at [facility] Advance Health Care Directives Advance Health Care Directives ([email or web address]   Next Medicare Annual Wellness Visit scheduled for next year: Yes

## 2023-05-30 NOTE — Progress Notes (Signed)
Subjective:   Sarah [name] is a 72 y.o. female who presents for Medicare Annual (Subsequent) preventive examination.  Visit Complete: Virtual  I connected with  [name] on 05/30/23 by a audio enabled telemedicine application and verified that I am speaking with the correct person using two identifiers.  Patient Location: Home  Provider Location: Home Office  I discussed the limitations of evaluation and management by telemedicine. The patient expressed understanding and agreed to proceed.  Vital Signs: Because this visit was a virtual/telehealth visit, some criteria may be missing or patient reported. Any vitals not documented were not able to be obtained and vitals that have been documented are patient reported.   Review of Systems     Cardiac Risk Factors include: advanced age (>76men, >36 women);hypertension     Objective:    Today's Vitals   05/30/23 1949  Weight: 198 lb (89.8 kg)  Height: 5\' 4"  (1.626 m)   Body mass index is 33.99 kg/m.     05/30/2023    7:56 PM 04/26/2022    1:46 PM 04/22/2022    1:51 PM 03/21/2022   11:11 AM 02/26/2022    3:30 PM 08/21/2021    8:52 AM 08/02/2021    8:59 AM  Advanced Directives  Does Patient Have a Medical Advance Directive? No No No No No No No  Does patient want to make changes to medical advance directive?    No - Patient declined     Would patient like information on creating a medical advance directive? Yes (MAU/Ambulatory/Procedural Areas - Information given) No - Patient declined No - Patient declined  No - Patient declined No - Patient declined No - Patient declined    Current Medications (verified) Outpatient Encounter Medications as of 05/30/2023  Medication Sig   amLODIPine-Valsartan-HCTZ 10-160-25 MG TABS Take 1 tablet by mouth daily.   aspirin 81 MG EC tablet Take 81 mg by mouth daily.     atorvastatin (LIPITOR) 40 MG tablet Take 1 tablet (40 mg total) by mouth daily.   carvedilol (COREG) 6.25 MG tablet TAKE 1  TABLET BY MOUTH TWICE DAILY WITH A MEAL   lidocaine (LIDODERM) 5 % Place 1 patch onto the skin daily. Remove & Discard patch within 12 hours or as directed by MD   meclizine (ANTIVERT) 25 MG tablet TAKE 1 TABLET BY MOUTH THREE TIMES DAILY AS NEEDED FOR DIZZINESS   Zoster Vaccine Adjuvanted [facility]) injection Shingrix (PF) 50 mcg/0.5 mL intramuscular suspension, kit (Patient not taking: Reported on 05/30/2023)   No facility-administered encounter medications on file as of 05/30/2023.    Allergies (verified) Penicillin g and Penicillins   History: Past Medical History:  Diagnosis Date   Arthritis    Hyperlipidemia    Hypertension    Polio 1964   Past Surgical History:  Procedure Laterality Date   FOOT SURGERY Left 1961   due to polio   HYSTEROSCOPY WITH D & C N/A 02/25/2018   Procedure: DILATATION AND CURETTAGE /HYSTEROSCOPY;  Surgeon: Hermina Staggers, MD;  Location: [facility];  Service: Gynecology;  Laterality: N/A;   TONSILLECTOMY AND ADENOIDECTOMY     Family History  Problem Relation Age of Onset   Hypertension Mother    Stroke Mother    Cancer Father 75       Unknown   Stroke Sister        multiple   Hypertension Sister    Kidney disease Sister    Diabetes Brother  Hypertension Sister    Hypertension Brother    Kidney disease Son    Diabetes Son    Hypertension Daughter    Social History   Socioeconomic History   Marital status: Widowed    Spouse name: Not on file   Number of children: 6   Years of education: 41   Highest education level: 12th grade  Occupational History   Occupation: Child Care   Tobacco Use   Smoking status: Never   Smokeless tobacco: Never   Tobacco comments:    No plans to start  Vaping Use   Vaping status: Never Used  Substance and Sexual Activity   Alcohol use: No   Drug use: No   Sexual activity: Not Currently  Other Topics Concern   Not on file  Social History Narrative   Patient lives in [place]  with one of her adopted children.    Patient has 3 birthed children and 3 adopted.   Patient is a widow.   Patient has a lot of stress at home. 1 son in jail and 1 daughter who runs away and hangs with "wrong crowd."   Patient enjoys crocheting, sewing, gardening, puzzles, and playing games with her family.    Works part time at [employer]: Low Risk  (05/30/2023)   Overall Financial Resource Strain (CARDIA)    Difficulty of Paying Living Expenses: Not hard at all  Food Insecurity: No Food Insecurity (05/30/2023)   Hunger Vital Sign    Worried About Running Out of Food in the Last Year: Never true    Ran Out of Food in the Last Year: Never true  Transportation Needs: No Transportation Needs (05/30/2023)   PRAPARE - Administrator, Civil Service (Medical): No    Lack of Transportation (Non-Medical): No  Physical Activity: Insufficiently Active (05/30/2023)   Exercise Vital Sign    Days of Exercise per Week: 3 days    Minutes of Exercise per Session: 30 min  Stress: No Stress Concern Present (05/30/2023)   [employer] of Occupational Health - Occupational Stress Questionnaire    Feeling of Stress : Only a little  Social Connections: Moderately Isolated (05/30/2023)   Social Connection and Isolation Panel [NHANES]    Frequency of Communication with Friends and Family: More than three times a week    Frequency of Social Gatherings with Friends and Family: Three times a week    Attends Religious Services: More than 4 times per year    Active Member of Clubs or Organizations: No    Attends Banker Meetings: Never    Marital Status: Widowed    Tobacco Counseling Counseling given: Not Answered Tobacco comments: No plans to start   Clinical Intake:  Pre-visit preparation completed: Yes  Pain :  No/denies pain     Diabetes: No  How often do you need to have someone help you when you read instructions, pamphlets, or other written materials from your doctor or pharmacy?: 1 - Never  Interpreter Needed?: No  Information entered by :: Kandis Fantasia LPN   Activities of Daily Living    05/30/2023    7:56 PM  In your present state of health, do you have any difficulty performing the following activities:  Hearing? 0  Vision? 0  Difficulty concentrating or making decisions? 0  Walking or climbing stairs? 0  Dressing or bathing? 0  Doing errands, shopping? 0  Preparing Food and eating ? N  Using the Toilet? N  In the past six months, have you accidently leaked urine? N  Do you have problems with loss of bowel control? N  Managing your Medications? N  Managing your Finances? N  Housekeeping or managing your Housekeeping? N    Patient Care Team: Darral Dash, DO as PCP - General (Family Medicine) Park Liter, DPM (Inactive) as Consulting Physician (Podiatry)  Indicate any recent Medical Services you may have received from other than [facility] providers in the past year (date may be approximate).     Assessment:   This is a routine wellness examination for Azhia.  Hearing/Vision screen Hearing Screening - Comments:: Denies hearing difficulties   Vision Screening - Comments:: No vision problems; will schedule routine eye exam soon    Dietary issues and exercise activities discussed:     Goals Addressed             This Visit's Progress    Remain active and independent        Depression Screen    05/30/2023    7:54 PM 04/02/2023    9:02 AM 04/22/2022    1:52 PM 03/13/2022    4:16 PM 02/26/2022    3:29 PM 12/17/2021    3:12 PM 08/21/2021    8:52 AM  PHQ 2/9 Scores  PHQ - 2 Score 0 0 1 0 0 0 0  PHQ- 9 Score 0 0 2 0 0 0 0    Fall Risk    05/30/2023    7:56 PM 04/02/2023    8:58 AM 04/22/2022    1:51 PM 02/26/2022    3:29 PM 08/21/2021    8:51 AM  Fall  Risk   Falls in the past year? 0 0 1 0 0  Number falls in past yr: 0 0 0 0 0  Injury with Fall? 0 0 1 0 0  Risk for fall due to : No Fall Risks No Fall Risks     Follow up Falls prevention discussed;Education provided;Falls evaluation completed        MEDICARE RISK AT HOME: Medicare Risk at Home Any stairs in or around the home?: No If so, are there any without handrails?: No Home free of loose throw rugs in walkways, pet beds, electrical cords, etc?: Yes Adequate lighting in your home to reduce risk of falls?: Yes Life alert?: No Use of a cane, walker or w/c?: No Grab bars in the bathroom?: Yes Shower chair or bench in shower?: No Elevated toilet seat or a handicapped toilet?: No  TIMED UP AND GO:  Was the test performed?  No    Cognitive Function:    07/28/2018    9:32 AM  MMSE - Mini Mental State Exam  Orientation to time 5  Orientation to Place 5  Registration 3  Attention/ Calculation 5  Recall 3  Language- name 2  objects 2  Language- repeat 1  Language- follow 3 step command 3  Language- read & follow direction 1  Write a sentence 1  Copy design 1  Total score 30        05/30/2023    7:56 PM 04/25/2020    9:30 AM 07/28/2018    9:33 AM  6CIT Screen  What Year? 0 points 0 points 0 points  What month? 0 points 0 points 0 points  What time? 0 points 0 points 0 points  Count back from 20 0 points 0 points 0 points  Months in reverse 0 points 0 points 0 points  Repeat phrase 0 points 0 points 0 points  Total Score 0 points 0 points 0 points    Immunizations Immunization History  Administered Date(s) Administered   Fluad Quad(high Dose 65+) 07/05/2021   Influenza Whole 07/27/2007   PFIZER(Purple Top)SARS-COV-2 Vaccination 12/02/2019, 12/30/2019   PPD Test 04/02/2023   Pfizer Covid-19 Vaccine Bivalent Booster 54yrs & up 08/02/2021   Pneumococcal Conjugate-13 04/16/2018   Pneumococcal Polysaccharide-23 06/08/2020   Td 02/04/1990   Tdap 01/31/2016    Zoster Recombinant(Shingrix) 05/21/2017   Zoster, Live 01/31/2016    TDAP status: Up to date  Flu Vaccine status: Due, Education has been provided regarding the importance of this vaccine. Advised may receive this vaccine at local pharmacy or Health Dept. Aware to provide a copy of the vaccination record if obtained from local pharmacy or Health Dept. Verbalized acceptance and understanding.  Pneumococcal vaccine status: Up to date  Covid-19 vaccine status: Information provided on how to obtain vaccines.   Qualifies for Shingles Vaccine? Yes   Zostavax completed Yes   Shingrix Completed?: No.    Education has been provided regarding the importance of this vaccine. Patient has been advised to call insurance company to determine out of pocket expense if they have not yet received this vaccine. Advised may also receive vaccine at local pharmacy or Health Dept. Verbalized acceptance and understanding.  Screening Tests Health Maintenance  Topic Date Due   Zoster Vaccines- Shingrix (2 of 2) 07/16/2017   COVID-19 Vaccine (4 - 2023-24 season) 06/07/2022   INFLUENZA VACCINE  05/08/2023   Medicare Annual Wellness (AWV)  05/29/2024   MAMMOGRAM  05/19/2025   DTaP/Tdap/Td (3 - Td or Tdap) 01/30/2026   Colonoscopy  06/20/2032   Pneumonia Vaccine 47+ Years old  Completed   DEXA SCAN  Completed   Hepatitis C Screening  Completed   HPV VACCINES  Aged Out    Health Maintenance  Health Maintenance Due  Topic Date Due   Zoster Vaccines- Shingrix (2 of 2) 07/16/2017   COVID-19 Vaccine (4 - 2023-24 season) 06/07/2022   INFLUENZA VACCINE  05/08/2023    Colorectal cancer screening: Type of screening: Colonoscopy. Completed 06/20/22. Repeat every 10 years  Mammogram status: Completed 05/20/23. Repeat every year  Bone Density status: Completed 06/03/18. Results reflect: Bone density results: NORMAL. Repeat every 5 years.  Lung Cancer Screening: (Low Dose CT Chest recommended if Age 25-80 years, 20  pack-year currently smoking OR have quit w/in 15years.) does not qualify.   Lung Cancer Screening Referral: n/a  Additional Screening:  Hepatitis C Screening: does qualify; Completed 01/30/16  Vision Screening: Recommended annual ophthalmology exams for early detection of glaucoma and other disorders of the eye. Is the patient up to date with their annual eye exam?  No  Who is the provider or what is the name of the office in which the patient attends  annual eye exams? None  If pt is not established with a provider, would they like to be referred to a provider to establish care? No .   Dental Screening: Recommended annual dental exams for proper oral hygiene  Community Resource Referral / Chronic Care Management: CRR required this visit?  No   CCM required this visit?  No     Plan:     I have personally reviewed and noted the following in the patient's chart:   Medical and social history Use of alcohol, tobacco or illicit drugs  Current medications and supplements including opioid prescriptions. Patient is not currently taking opioid prescriptions. Functional ability and status Nutritional status Physical activity Advanced directives List of other physicians Hospitalizations, surgeries, and ER visits in previous 12 months Vitals Screenings to include cognitive, depression, and falls Referrals and appointments  In addition, I have reviewed and discussed with patient certain preventive protocols, quality metrics, and best practice recommendations. A written personalized care plan for preventive services as well as general preventive health recommendations were provided to patient.     Kandis Fantasia [place], [place]   11/06/8655   After Visit Summary: (MyChart) Due to this being a telephonic visit, the after visit summary with patients personalized plan was offered to patient via MyChart   Nurse Notes: No concerns at this time

## 2023-06-02 ENCOUNTER — Other Ambulatory Visit: Payer: Self-pay | Admitting: Student

## 2023-06-20 ENCOUNTER — Ambulatory Visit (INDEPENDENT_AMBULATORY_CARE_PROVIDER_SITE_OTHER): Payer: Medicare HMO | Admitting: Podiatry

## 2023-06-20 ENCOUNTER — Encounter: Payer: Self-pay | Admitting: Podiatry

## 2023-06-20 DIAGNOSIS — M79674 Pain in right toe(s): Secondary | ICD-10-CM

## 2023-06-20 DIAGNOSIS — B351 Tinea unguium: Secondary | ICD-10-CM

## 2023-06-20 DIAGNOSIS — L84 Corns and callosities: Secondary | ICD-10-CM

## 2023-06-20 DIAGNOSIS — M79675 Pain in left toe(s): Secondary | ICD-10-CM

## 2023-06-20 NOTE — Progress Notes (Signed)
This patient presents to the office with chief complaint of painful left foot.  Patient has developed painful calluses left foot.  She has history of polio and inverted left foot.  This causes her to walk on the outside of her left foot.  She previously had surgery by Dr.  Samuella Cota.  She presents to the office for evaluation and treatment.  Vascular  Dorsalis pedis and posterior tibial pulses are palpable  B/L.  Capillary return  WNL.  Temperature gradient is  WNL.  Skin turgor  WNL  Sensorium  Senn Weinstein monofilament wire  WNL. Normal tactile sensation.  Nail Exam  Patient has normal nails with no evidence of bacterial or fungal infection.  Orthopedic  Exam  Muscle tone and muscle strength  WNL.  No limitations of motion feet  B/L.  No crepitus or joint effusion noted.  She has inverted left foot s/p polio.  Skin  No open lesions.  Normal skin texture and turgor.   Callus noted sub 5th met, sub fifth metabase.  Callus left foot  ROV.  Debride callus with dremel tool.  Patient was told to RTC  3 months for callus care. Nails were done as a courtesy.    Helane Gunther DPM ot left callus.

## 2023-08-03 ENCOUNTER — Other Ambulatory Visit: Payer: Self-pay | Admitting: Student

## 2023-08-03 DIAGNOSIS — I10 Essential (primary) hypertension: Secondary | ICD-10-CM

## 2023-08-03 DIAGNOSIS — E7849 Other hyperlipidemia: Secondary | ICD-10-CM

## 2023-08-03 DIAGNOSIS — E875 Hyperkalemia: Secondary | ICD-10-CM

## 2023-09-19 ENCOUNTER — Ambulatory Visit: Payer: Medicare Other | Admitting: Podiatry

## 2023-12-16 ENCOUNTER — Other Ambulatory Visit: Payer: Self-pay | Admitting: Student

## 2024-01-26 ENCOUNTER — Other Ambulatory Visit: Payer: Self-pay | Admitting: Student

## 2024-02-09 ENCOUNTER — Other Ambulatory Visit: Payer: Self-pay | Admitting: Student

## 2024-02-26 ENCOUNTER — Ambulatory Visit: Payer: Self-pay | Admitting: Student

## 2024-02-26 ENCOUNTER — Ambulatory Visit: Admitting: Student

## 2024-02-26 ENCOUNTER — Encounter: Payer: Self-pay | Admitting: Student

## 2024-02-26 ENCOUNTER — Ambulatory Visit (INDEPENDENT_AMBULATORY_CARE_PROVIDER_SITE_OTHER): Admitting: Student

## 2024-02-26 VITALS — BP 190/100 | HR 64 | Wt 190.0 lb

## 2024-02-26 DIAGNOSIS — I10 Essential (primary) hypertension: Secondary | ICD-10-CM

## 2024-02-26 DIAGNOSIS — R7303 Prediabetes: Secondary | ICD-10-CM

## 2024-02-26 LAB — POCT GLYCOSYLATED HEMOGLOBIN (HGB A1C): HbA1c, POC (prediabetic range): 5.9 % (ref 5.7–6.4)

## 2024-02-26 MED ORDER — CARVEDILOL 6.25 MG PO TABS: 6.2500 mg | ORAL_TABLET | Freq: Two times a day (BID) | ORAL | 0 refills | Status: DC

## 2024-02-26 MED ORDER — AMLODIPINE-VALSARTAN-HCTZ 10-160-25 MG PO TABS: 1.0000 | ORAL_TABLET | Freq: Every day | ORAL | 3 refills | Status: DC

## 2024-02-26 NOTE — Patient Instructions (Signed)
 It was great seeing you today.  As we discussed, - I refilled your blood pressure medicine. Very important to take every single day. - Please schedule an appointment at the front desk before you leave today to see me in 2 weeks for a re-check.   If you have any questions or concerns, please feel free to call the clinic.   Have a wonderful day,  Dr. Vallorie Gayer [facility] [phone]

## 2024-02-26 NOTE — Progress Notes (Signed)
    SUBJECTIVE:   CHIEF COMPLAINT / HPI:   Sarah [name] is a 73 year old female with hypertension who presents with elevated blood pressure.  She missed her carvedilol  dose today after running out of medication yesterday. Home blood pressure readings have been around 150/70 and 140/75, with an elevation today potentially due to missed medication and dietary choices. She denies chest pain, shortness of breath, dizziness, or headaches. Her current medications include carvedilol  and a combination pill for hypertension, obtained from a local pharmacy with [employer] coverage.   HCM: UTD on colonoscopy (due in 2033) and mammogram (due in August 2025)  PERTINENT  PMH / PSH: HTN, HLD  OBJECTIVE:   BP (!) 190/100   Pulse 64   Wt 190 lb (86.2 kg)   SpO2 100%   BMI 32.61 kg/m   General: NAD, well appearing elderly female Cardiac: RRR Neuro: A&O Respiratory: normal WOB on RA. No wheezing or crackles on auscultation, good lung sounds throughout Extremities: Moving all 4 extremities equally   ASSESSMENT/PLAN:   HYPERTENSION, BENIGN SYSTEMIC Significantly elevated x2. Asymptomatic- no signs of hypertensive emergency today. BMP today Re-prescribed Amlodipine -Valsartan -hydrochlorothiazide  and Carvedilol  Close f/u in 2 weeks for BP re-check   Vallorie Gayer, DO [facility]

## 2024-02-26 NOTE — Assessment & Plan Note (Signed)
 Significantly elevated x2. Asymptomatic- no signs of hypertensive emergency today. BMP today Re-prescribed Amlodipine -Valsartan -hydrochlorothiazide  and Carvedilol  Close f/u in 2 weeks for BP re-check

## 2024-02-27 LAB — BASIC METABOLIC PANEL WITH GFR
BUN/Creatinine Ratio: 22 (ref 12–28)
BUN: 16 mg/dL (ref 8–27)
CO2: 25 mmol/L (ref 20–29)
Calcium: 9.1 mg/dL (ref 8.7–10.3)
Chloride: 104 mmol/L (ref 96–106)
Creatinine, Ser: 0.73 mg/dL (ref 0.57–1.00)
Glucose: 118 mg/dL — ABNORMAL HIGH (ref 70–99)
Potassium: 4.7 mmol/L (ref 3.5–5.2)
Sodium: 143 mmol/L (ref 134–144)
eGFR: 88 mL/min/{1.73_m2} (ref >59)

## 2024-03-12 ENCOUNTER — Ambulatory Visit (INDEPENDENT_AMBULATORY_CARE_PROVIDER_SITE_OTHER): Admitting: Student

## 2024-03-12 ENCOUNTER — Encounter: Payer: Self-pay | Admitting: Student

## 2024-03-12 VITALS — BP 130/90 | HR 61 | Ht 64.0 in | Wt 189.2 lb

## 2024-03-12 DIAGNOSIS — I10 Essential (primary) hypertension: Secondary | ICD-10-CM

## 2024-03-12 MED ORDER — LOSARTAN POTASSIUM 25 MG PO TABS: 25.0000 mg | ORAL_TABLET | Freq: Every day | ORAL | 3 refills | Status: AC

## 2024-03-12 NOTE — Progress Notes (Signed)
    SUBJECTIVE:   CHIEF COMPLAINT / HPI:   Hypertension follow up Elevated x2 at last visit on 02/26/2024 Taking Carvedilol  only Was unsure about her other medications that she should be taking (I.e combination amlodipine -valsartan -hydrochlorothiazide ) No chest pain, shortness of breath, edema   PERTINENT  PMH / PSH: Prediabetes, Obesity, HLD  OBJECTIVE:   BP (!) 130/90   Pulse 61   Ht 5\' 4"  (1.626 m)   Wt 189 lb 3.2 oz (85.8 kg)   SpO2 99%   BMI 32.48 kg/m   General: Pleasant, well appearing elderly female Cardiac: RRR Neuro: A&O Respiratory: normal WOB on RA. No wheezing or crackles on auscultation, good lung sounds throughout Extremities: Moving all 4 extremities equally  ASSESSMENT/PLAN:   HYPERTENSION, BENIGN SYSTEMIC Near goal, 130/90 Only taking single agent Carvedilol  START Losartan 25 mg daily F/u 2 weeks for BP recheck BMP at that visit, future order pended     Vallorie Gayer, DO [facility]

## 2024-03-12 NOTE — Assessment & Plan Note (Signed)
 Near goal, 130/90 Only taking single agent Carvedilol  START Losartan 25 mg daily F/u 2 weeks for BP recheck BMP at that visit, future order pended

## 2024-03-12 NOTE — Patient Instructions (Addendum)
 It was great seeing you today.  Your medications listed below: - Atorvastatin  40 mg - Carvedilol  6.25 mg twice daily - Losartan 25 mg once daily (sent to pharmacy for pick up)  Schedule a follow up appointment in 2 weeks for blood pressure follow-up.   If you have any questions or concerns, please feel free to call the clinic.   Have a wonderful day,  Dr. Vallorie Gayer [facility] [phone]

## 2024-03-16 ENCOUNTER — Encounter: Payer: Self-pay | Admitting: *Deleted

## 2024-04-22 ENCOUNTER — Other Ambulatory Visit: Payer: Self-pay | Admitting: Family Medicine

## 2024-04-22 DIAGNOSIS — Z1231 Encounter for screening mammogram for malignant neoplasm of breast: Secondary | ICD-10-CM

## 2024-05-10 ENCOUNTER — Other Ambulatory Visit: Payer: Self-pay

## 2024-05-11 MED ORDER — MECLIZINE HCL 25 MG PO TABS: 25.0000 mg | ORAL_TABLET | Freq: Three times a day (TID) | ORAL | 0 refills | Status: DC

## 2024-05-21 ENCOUNTER — Ambulatory Visit
Admission: RE | Admit: 2024-05-21 | Discharge: 2024-05-21 | Disposition: A | Discharge status: Home / Self Care | Source: Ambulatory Visit | Attending: Family Medicine | Admitting: Family Medicine

## 2024-05-21 DIAGNOSIS — Z1231 Encounter for screening mammogram for malignant neoplasm of breast: Secondary | ICD-10-CM

## 2024-06-03 ENCOUNTER — Ambulatory Visit: Payer: Medicare HMO

## 2024-06-03 VITALS — BP 157/80 | Ht 64.0 in | Wt 189.0 lb

## 2024-06-03 DIAGNOSIS — Z0000 Encounter for general adult medical examination without abnormal findings: Secondary | ICD-10-CM

## 2024-06-03 NOTE — Patient Instructions (Addendum)
 Ms. Weigand , Thank you for taking time out of your busy schedule to complete your Annual Wellness Visit with me. I enjoyed our conversation and look forward to speaking with you again next year. I, as well as your care team,  appreciate your ongoing commitment to your health goals. Please review the following plan we discussed and let me know if I can assist you in the future. Your Game plan/ To Do List    Referrals: If you haven't heard from the office you've been referred to, please reach out to them at the phone provided.   Follow up Visits: We will see or speak with you next year for your Next Medicare AWV with our clinical staff Have you seen your provider in the last 6 months (3 months if uncontrolled diabetes)? Yes  Clinician Recommendations:  Aim for 30 minutes of exercise or brisk walking, 6-8 glasses of water, and 5 servings of fruits and vegetables each day.       This is a list of the screenings recommended for you:  Health Maintenance  Topic Date Due   Zoster (Shingles) Vaccine (2 of 2) 07/16/2017   COVID-19 Vaccine (4 - 2024-25 season) 06/08/2023   Flu Shot  05/07/2024   Medicare Annual Wellness Visit  06/03/2025   DTaP/Tdap/Td vaccine (3 - Td or Tdap) 01/30/2026   Mammogram  05/21/2026   Colon Cancer Screening  06/20/2032   Pneumococcal Vaccine for age over 29  Completed   DEXA scan (bone density measurement)  Completed   Hepatitis C Screening  Completed   HPV Vaccine  Aged Out   Meningitis B Vaccine  Aged Out    Advanced directives: (Declined) Advance directive discussed with you today. Even though you declined this today, please call our office should you change your mind, and we can give you the proper paperwork for you to fill out. Advance Care Planning is important because it:  [x]  Makes sure you receive the medical care that is consistent with your values, goals, and preferences  [x]  It provides guidance to your family and loved ones and reduces their decisional  burden about whether or not they are making the right decisions based on your wishes.  Follow the link provided in your after visit summary or read over the paperwork we have mailed to you to help you started getting your Advance Directives in place. If you need assistance in completing these, please reach out to us  so that we can help you!  See attachments for Preventive Care and Fall Prevention Tips.

## 2024-06-03 NOTE — Progress Notes (Signed)
 Because this visit was a virtual/telehealth visit,  certain criteria was not obtained, such a blood pressure, CBG if applicable, and timed get up and go. Any medications not marked as taking were not mentioned during the medication reconciliation part of the visit. Any vitals not documented were not able to be obtained due to this being a telehealth visit or patient was unable to self-report a recent blood pressure reading due to a lack of equipment at home via telehealth. Vitals that have been documented are verbally provided by the patient.   Subjective:   Sarah [name] is a 73 y.o. who presents for a Medicare Wellness preventive visit.  As a reminder, Annual Wellness Visits don't include a physical exam, and some assessments may be limited, especially if this visit is performed virtually. We may recommend an in-person follow-up visit with your provider if needed.  Visit Complete: Virtual I connected with  Sarah [name] on 06/03/24 by a audio enabled telemedicine application and verified that I am speaking with the correct person using two identifiers.  Patient Location: Home  Provider Location: Home Office  I discussed the limitations of evaluation and management by telemedicine. The patient expressed understanding and agreed to proceed.  Vital Signs: Because this visit was a virtual/telehealth visit, some criteria may be missing or patient reported. Any vitals not documented were not able to be obtained and vitals that have been documented are patient reported.  VideoDeclined- This patient declined Librarian, academic. Therefore the visit was completed with audio only.  Persons Participating in Visit: Patient.  AWV Questionnaire: No: Patient Medicare AWV questionnaire was not completed prior to this visit.  Cardiac Risk Factors include: advanced age (>16men, >69 women);dyslipidemia;hypertension;obesity (BMI >30kg/m2);family history of premature cardiovascular  disease     Objective:    Today's Vitals   06/03/24 1348  BP: (!) 157/80  Weight: 189 lb (85.7 kg)  Height: 5' 4 (1.626 m)  PainSc: 0-No pain   Body mass index is 32.44 kg/m.     06/03/2024    1:49 PM 05/30/2023    7:56 PM 04/26/2022    1:46 PM 04/22/2022    1:51 PM 03/21/2022   11:11 AM 02/26/2022    3:30 PM 08/21/2021    8:52 AM  Advanced Directives  Does Patient Have a Medical Advance Directive? No No No No No No No  Does patient want to make changes to medical advance directive?     No - Patient declined    Would patient like information on creating a medical advance directive? No - Patient declined Yes (MAU/Ambulatory/Procedural Areas - Information given) No - Patient declined No - Patient declined  No - Patient declined No - Patient declined    Current Medications (verified) Outpatient Encounter Medications as of 06/03/2024  Medication Sig   atorvastatin  (LIPITOR) 40 MG tablet Take 1 tablet by mouth once daily   carvedilol  (COREG ) 6.25 MG tablet Take 1 tablet (6.25 mg total) by mouth 2 (two) times daily with a meal.   losartan  (COZAAR ) 25 MG tablet Take 1 tablet (25 mg total) by mouth at bedtime.   meclizine  (ANTIVERT ) 25 MG tablet Take 1 tablet (25 mg total) by mouth 3 (three) times daily.   No facility-administered encounter medications on file as of 06/03/2024.    Allergies (verified) Penicillin g and Penicillins   History: Past Medical History:  Diagnosis Date   Arthritis    Callus 12/13/2022   Deformity of ankle and foot, acquired  05/23/2009   Due to Polio.      Foot pain, left 08/01/2011   Seeing PT as of their note 04/2014.     Gait instability 07/02/2014   History of poliomyelitis 07/16/2018   Hyperkalemia 08/02/2021   Hyperlipidemia    Hypertension    Joint pain 07/16/2018   Musculoskeletal strain 03/14/2022   Polio 1964   Postmenopausal bleeding 07/07/2017   US  Pelvis on 07/11/17: Mildly thickened endometrium measuring up to 5.5 mm. Needs  endometrial biopsy but patient wants to schedule after she recovers from foot surgery that is scheduled for 07/13/17     Past Surgical History:  Procedure Laterality Date   FOOT SURGERY Left 1961   due to polio   HYSTEROSCOPY WITH D & C N/A 02/25/2018   Procedure: DILATATION AND CURETTAGE /HYSTEROSCOPY;  Surgeon: Lorence Ozell CROME, MD;  Location: [facility];  Service: Gynecology;  Laterality: N/A;   TONSILLECTOMY AND ADENOIDECTOMY     Family History  Problem Relation Age of Onset   Hypertension Mother    Stroke Mother    Cancer Father 38       Unknown   Stroke Sister        multiple   Hypertension Sister    Kidney disease Sister    Diabetes Brother    Hypertension Sister    Hypertension Brother    Kidney disease Son    Diabetes Son    Hypertension Daughter    Social History   Socioeconomic History   Marital status: Widowed    Spouse name: Not on file   Number of children: 6   Years of education: 58   Highest education level: 12th grade  Occupational History   Occupation: Child Care   Tobacco Use   Smoking status: Never   Smokeless tobacco: Never   Tobacco comments:    No plans to start  Vaping Use   Vaping status: Never Used  Substance and Sexual Activity   Alcohol use: No   Drug use: No   Sexual activity: Not Currently  Other Topics Concern   Not on file  Social History Narrative   Patient lives in [place] with one of her adopted children.    Patient has 3 birthed children and 3 adopted.   Patient is a widow.   Patient has a lot of stress at home. 1 son in jail and 1 daughter who runs away and hangs with wrong crowd.   Patient enjoys crocheting, sewing, gardening, puzzles, and playing games with her family.    Works part time at [employer]                                                                                                         Social Drivers of [employer]: Low Risk  (06/03/2024)   Overall  Financial Resource Strain (CARDIA)    Difficulty of Paying Living Expenses: Not hard at all  Food Insecurity: No Food Insecurity (06/03/2024)   Hunger Vital Sign    Worried About Running Out  of Food in the Last Year: Never true    Ran Out of Food in the Last Year: Never true  Transportation Needs: No Transportation Needs (06/03/2024)   PRAPARE - Administrator, Civil Service (Medical): No    Lack of Transportation (Non-Medical): No  Physical Activity: Sufficiently Active (06/03/2024)   Exercise Vital Sign    Days of Exercise per Week: 5 days    Minutes of Exercise per Session: 30 min  Stress: No Stress Concern Present (06/03/2024)   [employer] of Occupational Health - Occupational Stress Questionnaire    Feeling of Stress: Not at all  Social Connections: Moderately Isolated (06/03/2024)   Social Connection and Isolation Panel    Frequency of Communication with Friends and Family: More than three times a week    Frequency of Social Gatherings with Friends and Family: Three times a week    Attends Religious Services: More than 4 times per year    Active Member of Clubs or Organizations: No    Attends Banker Meetings: Never    Marital Status: Widowed    Tobacco Counseling Counseling given: Not Answered Tobacco comments: No plans to start    Clinical Intake:  Pre-visit preparation completed: Yes  Pain : No/denies pain Pain Score: 0-No pain     BMI - recorded: 32.44 Nutritional Status: BMI > 30  Obese Nutritional Risks: None Diabetes: No  Lab Results  Component Value Date   HGBA1C 5.9 02/26/2024   HGBA1C 6.2 (H) 04/02/2023   HGBA1C 6.2 07/05/2021     How often do you need to have someone help you when you read instructions, pamphlets, or other written materials from your doctor or pharmacy?: 1 - Never  Interpreter Needed?: No  Information entered by :: Jamin Panther N. Ples Trudel, LPN.   Activities of Daily Living     06/03/2024    1:49 PM  06/02/2024    2:25 PM  In your present state of health, do you have any difficulty performing the following activities:  Hearing? 0 0  Vision? 0 0  Difficulty concentrating or making decisions? 0 0  Walking or climbing stairs? 0 0  Dressing or bathing? 0 0  Doing errands, shopping? 0 0  Preparing Food and eating ? N N  Using the Toilet? N N  In the past six months, have you accidently leaked urine? N N  Do you have problems with loss of bowel control? N N  Managing your Medications? N N  Managing your Finances? N N  Housekeeping or managing your Housekeeping? N N    Patient Care Team: Suzen Houston NOVAK, DO as PCP - General (Family Medicine) Gretel Ozell PARAS, DPM (Inactive) as Consulting Physician (Podiatry)  I have updated your Care Teams any recent Medical Services you may have received from other providers in the past year.     Assessment:   This is a routine wellness examination for Xee.  Hearing/Vision screen Hearing Screening - Comments:: Denies hearing difficulties.  Vision Screening - Comments:: No rx glasses - up to date with routine eye exams with [facility]    Goals Addressed             This Visit's Progress    06/03/24: My goal is to lose 30 pounds.         Depression Screen     06/03/2024    1:54 PM 02/26/2024   10:50 AM 05/30/2023    7:54 PM 04/02/2023    9:02  AM 04/22/2022    1:52 PM 03/13/2022    4:16 PM 02/26/2022    3:29 PM  PHQ 2/9 Scores  PHQ - 2 Score 0 0 0 0 1 0 0  PHQ- 9 Score 0 0 0 0 2 0 0    Fall Risk     06/03/2024    1:49 PM 06/02/2024    2:25 PM 03/12/2024   10:42 AM 02/26/2024   10:47 AM 05/30/2023    7:56 PM  Fall Risk   Falls in the past year? 0 0 0 0 0  Number falls in past yr: 0    0  Injury with Fall? 0    0  Risk for fall due to : No Fall Risks    No Fall Risks  Follow up Falls evaluation completed    Falls prevention discussed;Education provided;Falls evaluation completed    MEDICARE RISK AT HOME:  Medicare Risk  at Home Any stairs in or around the home?: No If so, are there any without handrails?: No Home free of loose throw rugs in walkways, pet beds, electrical cords, etc?: Yes Adequate lighting in your home to reduce risk of falls?: Yes Life alert?: No Use of a cane, walker or w/c?: Yes Grab bars in the bathroom?: Yes Shower chair or bench in shower?: No Elevated toilet seat or a handicapped toilet?: No  TIMED UP AND GO:  Was the test performed?  No  Cognitive Function: Declined/Normal: No cognitive concerns noted by patient or family. Patient alert, oriented, able to answer questions appropriately and recall recent events. No signs of memory loss or confusion.    06/03/2024    1:49 PM 07/28/2018    9:32 AM  MMSE - Mini Mental State Exam  Not completed: Unable to complete   Orientation to time  5  Orientation to Place  5  Registration  3  Attention/ Calculation  5  Recall  3  Language- name 2 objects  2  Language- repeat  1  Language- follow 3 step command  3  Language- read & follow direction  1  Write a sentence  1  Copy design  1  Total score  30        06/03/2024    1:51 PM 05/30/2023    7:56 PM 04/25/2020    9:30 AM 07/28/2018    9:33 AM  6CIT Screen  What Year? 0 points 0 points 0 points 0 points  What month? 0 points 0 points 0 points 0 points  What time? 0 points 0 points 0 points 0 points  Count back from 20 0 points 0 points 0 points 0 points  Months in reverse 0 points 0 points 0 points 0 points  Repeat phrase 0 points 0 points 0 points 0 points  Total Score 0 points 0 points 0 points 0 points    Immunizations Immunization History  Administered Date(s) Administered   Fluad Quad(high Dose 65+) 07/05/2021   Influenza Whole 07/27/2007   PFIZER(Purple Top)SARS-COV-2 Vaccination 12/02/2019, 12/30/2019   PPD Test 04/02/2023   Pfizer Covid-19 Vaccine Bivalent Booster 31yrs & up 08/02/2021   Pneumococcal Conjugate-13 04/16/2018   Pneumococcal Polysaccharide-23  06/08/2020   Td 02/04/1990   Tdap 01/31/2016   Zoster Recombinant(Shingrix) 05/21/2017   Zoster, Live 01/31/2016    Screening Tests Health Maintenance  Topic Date Due   Zoster Vaccines- Shingrix (2 of 2) 07/16/2017   COVID-19 Vaccine (4 - 2024-25 season) 06/08/2023   INFLUENZA VACCINE  05/07/2024  Medicare Annual Wellness (AWV)  06/03/2025   DTaP/Tdap/Td (3 - Td or Tdap) 01/30/2026   MAMMOGRAM  05/21/2026   Colonoscopy  06/20/2032   Pneumococcal Vaccine: 50+ Years  Completed   DEXA SCAN  Completed   Hepatitis C Screening  Completed   HPV VACCINES  Aged Out   Meningococcal B Vaccine  Aged Out    Health Maintenance  Health Maintenance Due  Topic Date Due   Zoster Vaccines- Shingrix (2 of 2) 07/16/2017   COVID-19 Vaccine (4 - 2024-25 season) 06/08/2023   INFLUENZA VACCINE  05/07/2024   Health Maintenance Items Addressed: Yes Patient aware of current care gaps.  Patient due for the following care gaps: Covid-19, Flu and Shingrix vaccines.  Additional Screening:  Vision Screening: Recommended annual ophthalmology exams for early detection of glaucoma and other disorders of the eye. Would you like a referral to an eye doctor? No    Dental Screening: Recommended annual dental exams for proper oral hygiene  Community Resource Referral / Chronic Care Management: CRR required this visit?  No   CCM required this visit?  No   Plan:    I have personally reviewed and noted the following in the patient's chart:   Medical and social history Use of alcohol, tobacco or illicit drugs  Current medications and supplements including opioid prescriptions. Patient is not currently taking opioid prescriptions. Functional ability and status Nutritional status Physical activity Advanced directives List of other physicians Hospitalizations, surgeries, and ER visits in previous 12 months Vitals Screenings to include cognitive, depression, and falls Referrals and appointments  In  addition, I have reviewed and discussed with patient certain preventive protocols, quality metrics, and best practice recommendations. A written personalized care plan for preventive services as well as general preventive health recommendations were provided to patient.   Roz LOISE Fuller, LPN   [id number]   After Visit Summary: (MyChart) Due to this being a telephonic visit, the after visit summary with patients personalized plan was offered to patient via MyChart   Notes: Patient aware of current care gaps.  Patient due for the following care gaps: Covid-19, Flu and Shingrix vaccines.

## 2024-08-12 ENCOUNTER — Other Ambulatory Visit: Payer: Self-pay

## 2024-08-12 DIAGNOSIS — E7849 Other hyperlipidemia: Secondary | ICD-10-CM

## 2024-08-12 MED ORDER — ATORVASTATIN CALCIUM 40 MG PO TABS
40.0000 mg | ORAL_TABLET | Freq: Every day | ORAL | 0 refills | Status: AC
Start: 2024-08-12 — End: ?

## 2024-10-30 ENCOUNTER — Other Ambulatory Visit: Payer: Self-pay

## 2024-11-10 ENCOUNTER — Other Ambulatory Visit: Payer: Self-pay

## 2024-11-10 MED ORDER — CARVEDILOL 6.25 MG PO TABS: 6.2500 mg | ORAL_TABLET | Freq: Two times a day (BID) | ORAL | 0 refills | Status: AC
